# Patient Record
Sex: Female | Born: 1955
Health system: Southern US, Community
[De-identification: ages and names within clinical notes are randomized; demographics above are authoritative.]

## PROBLEM LIST (undated history)

## (undated) DIAGNOSIS — G2581 Restless legs syndrome: Secondary | ICD-10-CM

## (undated) DIAGNOSIS — F419 Anxiety disorder, unspecified: Secondary | ICD-10-CM

## (undated) DIAGNOSIS — E669 Obesity, unspecified: Secondary | ICD-10-CM

## (undated) DIAGNOSIS — R002 Palpitations: Secondary | ICD-10-CM

## (undated) DIAGNOSIS — I8390 Asymptomatic varicose veins of unspecified lower extremity: Secondary | ICD-10-CM

## (undated) DIAGNOSIS — G4700 Insomnia, unspecified: Secondary | ICD-10-CM

## (undated) DIAGNOSIS — K589 Irritable bowel syndrome without diarrhea: Secondary | ICD-10-CM

## (undated) DIAGNOSIS — R7303 Prediabetes: Secondary | ICD-10-CM

## (undated) DIAGNOSIS — G458 Other transient cerebral ischemic attacks and related syndromes: Secondary | ICD-10-CM

## (undated) DIAGNOSIS — M79606 Pain in leg, unspecified: Secondary | ICD-10-CM

## (undated) DIAGNOSIS — IMO0002 Reserved for concepts with insufficient information to code with codable children: Secondary | ICD-10-CM

## (undated) DIAGNOSIS — K219 Gastro-esophageal reflux disease without esophagitis: Secondary | ICD-10-CM

## (undated) DIAGNOSIS — C801 Malignant (primary) neoplasm, unspecified: Secondary | ICD-10-CM

## (undated) DIAGNOSIS — M545 Low back pain, unspecified: Secondary | ICD-10-CM

## (undated) DIAGNOSIS — R569 Unspecified convulsions: Secondary | ICD-10-CM

## (undated) DIAGNOSIS — F32A Depression, unspecified: Secondary | ICD-10-CM

## (undated) DIAGNOSIS — M1990 Unspecified osteoarthritis, unspecified site: Secondary | ICD-10-CM

## (undated) DIAGNOSIS — R519 Headache, unspecified: Secondary | ICD-10-CM

## (undated) DIAGNOSIS — R0600 Dyspnea, unspecified: Secondary | ICD-10-CM

## (undated) DIAGNOSIS — D649 Anemia, unspecified: Secondary | ICD-10-CM

## (undated) DIAGNOSIS — F329 Major depressive disorder, single episode, unspecified: Secondary | ICD-10-CM

## (undated) DIAGNOSIS — I809 Phlebitis and thrombophlebitis of unspecified site: Secondary | ICD-10-CM

## (undated) DIAGNOSIS — R51 Headache: Secondary | ICD-10-CM

## (undated) HISTORY — DX: Depression, unspecified: F32.A

## (undated) HISTORY — DX: Other transient cerebral ischemic attacks and related syndromes: G45.8

## (undated) HISTORY — DX: Major depressive disorder, single episode, unspecified: F32.9

## (undated) HISTORY — DX: Low back pain: M54.5

## (undated) HISTORY — DX: Headache: R51

## (undated) HISTORY — PX: APPENDECTOMY: SHX54

## (undated) HISTORY — DX: Irritable bowel syndrome, unspecified: K58.9

## (undated) HISTORY — DX: Gastro-esophageal reflux disease without esophagitis: K21.9

## (undated) HISTORY — DX: Low back pain, unspecified: M54.50

## (undated) HISTORY — PX: COLONOSCOPY: SHX174

## (undated) HISTORY — PX: OTHER SURGICAL HISTORY: SHX169

## (undated) HISTORY — DX: Headache, unspecified: R51.9

## (undated) HISTORY — DX: Pain in leg, unspecified: M79.606

## (undated) HISTORY — DX: Anemia, unspecified: D64.9

## (undated) HISTORY — PX: BREAST LUMPECTOMY: SHX2

## (undated) HISTORY — DX: Palpitations: R00.2

## (undated) HISTORY — DX: Phlebitis and thrombophlebitis of unspecified site: I80.9

## (undated) HISTORY — DX: Insomnia, unspecified: G47.00

## (undated) HISTORY — DX: Reserved for concepts with insufficient information to code with codable children: IMO0002

## (undated) HISTORY — DX: Obesity, unspecified: E66.9

## (undated) HISTORY — DX: Asymptomatic varicose veins of unspecified lower extremity: I83.90

---

## 2002-04-19 HISTORY — PX: LEG SURGERY: SHX1003

## 2011-04-20 DIAGNOSIS — R569 Unspecified convulsions: Secondary | ICD-10-CM

## 2011-04-20 HISTORY — DX: Unspecified convulsions: R56.9

## 2011-07-27 ENCOUNTER — Encounter: Payer: Self-pay | Admitting: Vascular Surgery

## 2011-08-12 ENCOUNTER — Encounter: Payer: Self-pay | Admitting: Vascular Surgery

## 2011-08-13 ENCOUNTER — Ambulatory Visit (INDEPENDENT_AMBULATORY_CARE_PROVIDER_SITE_OTHER): Payer: Medicaid Other | Admitting: *Deleted

## 2011-08-13 ENCOUNTER — Ambulatory Visit (INDEPENDENT_AMBULATORY_CARE_PROVIDER_SITE_OTHER): Payer: Medicaid Other | Admitting: Vascular Surgery

## 2011-08-13 ENCOUNTER — Encounter: Payer: Self-pay | Admitting: Vascular Surgery

## 2011-08-13 VITALS — BP 136/72 | HR 73 | Temp 97.5°F | Ht 63.0 in | Wt 165.0 lb

## 2011-08-13 DIAGNOSIS — I8390 Asymptomatic varicose veins of unspecified lower extremity: Secondary | ICD-10-CM | POA: Insufficient documentation

## 2011-08-13 DIAGNOSIS — I872 Venous insufficiency (chronic) (peripheral): Secondary | ICD-10-CM

## 2011-08-13 DIAGNOSIS — I83009 Varicose veins of unspecified lower extremity with ulcer of unspecified site: Secondary | ICD-10-CM

## 2011-08-13 DIAGNOSIS — L97909 Non-pressure chronic ulcer of unspecified part of unspecified lower leg with unspecified severity: Secondary | ICD-10-CM

## 2011-08-13 NOTE — Progress Notes (Signed)
[facility] [place] [facility]  Referred by:  Monico Blitz, MD [address]  [place],  [address]  Reason for referral: R leg varicosities  History of Present Illness  Tammy Sandoval is a 56 y.o. (10-Apr-1956) female who presents with chief complaint: swollen leg.  Patient notes, onset of swelling years ago, not associated with any specific etiology.  The patient has had no history of DVT, known history of varicose vein, no history of venous stasis ulcers, no history of  Lymphedema and no history of skin changes in lower legs.  There is known family history of venous disorders.  The patient has used knee high compression stockings in the past.  The patient had history of accidentally biopsy of thrombosed varicose vein leading to significant bleeding.  The patient is symptomatic with aching and itching as the day goes along.  Pt has undergone leg compression therapy for venous ulceration in the past.  Past Medical History  Diagnosis Date  . Ulcer     RLE  . Low back pain   . GERD (gastroesophageal reflux disease)   . Obesity   . Irritable bowel syndrome   . Thrombophlebitis   . Depression   . Anemia   . Generalized headaches   . Leg pain   . Insomnia   . Crescendo transient ischemic attacks   . Palpitations   . Varicose veins     Past Surgical History  Procedure Date  . Appendectomy   . Cesarean section   . Breast lumpectomy     bilateral  . Leg surgery 2004    mass removed on right leg    History   Social History  . Marital Status: Single    Spouse Name: N/A    Number of Children: N/A  . Years of Education: N/A   Occupational History  . Not on file.   Social History Main Topics  . Smoking status: Never Smoker   . Smokeless tobacco: Never Used  . Alcohol Use: No  . Drug Use: No  . Sexually Active:    Other Topics Concern  . Not on file   Social History Narrative  . No narrative on file    Family History  Problem Relation Age of Onset    . Cancer Mother     pancreatic and breast  . Deep vein thrombosis Mother   . Diabetes Mother   . Heart disease Mother   . Other Mother     varicose veing  . Heart disease Father   . Hyperlipidemia Father   . Hypertension Father   . Heart attack Father   . Diabetes Brother   . Heart attack Brother     Current Outpatient Prescriptions on File Prior to Visit  Medication Sig Dispense Refill  . ALPRAZolam (XANAX) 1 MG tablet Take 1 mg by mouth 2 (two) times daily.      . Aspirin-Salicylamide-Caffeine (BC HEADACHE POWDER PO) Take by mouth as needed.      Marland Kitchen esomeprazole (NEXIUM) 40 MG capsule Take 40 mg by mouth daily.      Marland Kitchen gabapentin (NEURONTIN) 300 MG capsule Take 300 mg by mouth 2 (two) times daily. Take 2 capsules twice daily as directed.      . lubiprostone (AMITIZA) 24 MCG capsule Take 24 mcg by mouth 2 (two) times daily with a meal.       . PARoxetine (PAXIL-CR) 37.5 MG 24 hr tablet Take 37.5 mg by mouth daily. Take 2  tablets daily.        Allergies  Allergen Reactions  . Latex     Redness,swelling  . Wellbutrin (Bupropion Hcl)     Review of Systems (Positive items checked otherwise negative)  General: [ ]  Weight loss, [ ]  Weight gain, [ ]   Loss of appetite, [ ]  Fever  Neurologic: [x]  Dizziness, [ ]  Blackouts, [ ]  Headaches, [ ]  Seizure, [x]  weakness and numbness in legs,  Ear/Nose/Throat: [ ]  Change in eyesight, [ ]  Change in hearing, [ ]  Nose bleeds, [ ]  Sore throat  Vascular: [x]  Pain in legs with walking, [x]  Pain in feet while lying flat, [ ]  Non-healing ulcer, [ ]  Stroke, [ ]  "Mini stroke", [x]  Slurred speech, [ ]  Temporary blindness, [ ]  Blood clot in vein, [ ]  Phlebitis, [x]  vaicose veins, [x]  leg swelling  Pulmonary: [ ]  Home oxygen, [ ]  Productive cough, [ ]  Bronchitis, [ ]  Coughing up blood, [ ]  Asthma, [ ]  Wheezing  Musculoskeletal: [ ]  Arthritis, [ ]  Joint pain, [ ]  Muscle pain, [x]  chronic back pain with bilateral leg numbness requiring back  injections  Cardiac: [ ]  Chest pain, [ ]  Chest tightness/pressure, [ ]  Shortness of breath when lying flat, [ ]  Shortness of breath with exertion, [ ]  Palpitations, [ ]  Heart murmur, [ ]  Arrythmia, [ ]  Atrial fibrillation  Hematologic: [ ]  Bleeding problems, [ ]  Clotting disorder, [ ]  Anemia  Psychiatric:  [x]  Depression, [ ]  Anxiety, [ ]  Attention deficit disorder  Gastrointestinal:  [ ]  Black stool,[ ]   Blood in stool, [ ]  Peptic ulcer disease, [ ]  Reflux, [ ]  Hiatal hernia, [ ]  Trouble swallowing, [ ]  Diarrhea, [ ]  Constipation  Urinary:  [ ]  Kidney disease, [ ]  Burning with urination, [ ]  Frequent urination, [ ]  Difficulty urinating  Skin: [ ]  Ulcers, [ ]  Rashes  Physical Examination  Filed Vitals:   08/13/11 0957  BP: 136/72  Pulse: 73  Temp: 97.5 F (36.4 C)  TempSrc: Oral  Height: 5' 3"  (1.6 m)  Weight: 165 lb (74.844 kg)   Body mass index is 29.23 kg/(m^2).  General: A&O x 3, WDWN  Head: [place]/AT  Ear/Nose/Throat: Hearing grossly intact, nares w/o erythema or drainage, oropharynx w/o Erythema/Exudate  Eyes: PERRLA, EOMI  Neck: Supple, no nuchal rigidity, no palpable LAD  Pulmonary: Sym exp, good air movt, CTAB, no rales, rhonchi, & wheezing  Cardiac: RRR, Nl S1, S2, no Murmurs, rubs or gallops  Vascular: Vessel Right Left  Radial Palpable Palpable  Brachial Palpable Palpable  Carotid Palpable, without bruit Palpable, without bruit  Aorta Non-palpable N/A  Femoral Palpable Palpable  Popliteal Non-palpable Non-palpable  PT Palpable Palpable  DP Palpable Palpable   Gastrointestinal: soft, NTND, -G/R, - HSM, - masses, - CVAT B  Musculoskeletal: M/S 5/5 throughout , Extremities without ischemic changes , R anterior and posterior lower leg varicosities, ?healed ulcerations vs biopsy sites, L posterior lower leg varicosities  Neurologic: CN 2-12 intact , Pain and light touch intact in extremities ,Motor exam as listed above  Psychiatric: Judgment intact, Mood  & affect appropriate for pt's clinical situation  Dermatologic: See M/S exam for extremity exam, no rashes otherwise noted  Lymph : No Cervical, Axillary, or Inguinal lymphadenopathy   Non-Invasive Vascular Imaging  RLE Venous Insufficiency Duplex (Date: 08/13/11):   RLE: GSV reflux, deep venous reflux throughout, no DVT  Outside Studies/Documentation 10 pages of outside documents were reviewed including: documentation of prior treatment for presumed venous ulcers  and OSH arterial studies.  Medical Decision Making  Tammy Sandoval is a 56 y.o. female who presents with: CVI (C5)   Based on the patient's history and examination, I recommend: 3 month thigh high 20-30 mm Hg compression stockings and then evaluation in [facility] for possible EVLA of R GSV  I suspect the left leg may eventually need evaluation but not immediately.  The pt was given the Rx for the compression stockings and will be scheduled for an appt in the [facility] in 3 months.  Thank you for allowing [place] to participate in this patient's care.  Adele Barthel, MD [facility] Office: [phone] Pager: [phone]  08/13/2011, 11:09 AM

## 2011-08-23 NOTE — Procedures (Unsigned)
DUPLEX DEEP VENOUS EXAM - LOWER EXTREMITY  INDICATION:  Venous stasis ulcer  HISTORY:  Edema:  Yes Trauma/Surgery:  No Pain:  Yes PE:  No Previous DVT:  No Anticoagulants: Other:  DUPLEX EXAM:               CFV   SFV   PopV  PTV    GSV               R  L  R  L  R  L  R   L  R  L Thrombosis    o     o     o     o      o Spontaneous   +     +     +     +      + Phasic        +     +     +     +      + Augmentation  +     +     +     +      + Compressible  +     +     +     +      + Competent     o     o     o     o      o  Legend:  + - yes  o - no  p - partial  D - decreased  IMPRESSION: 1. No evidence of deep vein thrombosis noted throughout the right     lower extremity. 2. Reflux of >500 milliseconds is noted throughout the right great     saphenous vein with diameter measurements noted on the attached     worksheet. 3. Reflux of >500 milliseconds is noted throughout the right lower     extremity deep venous system.    _____________________________ Conrad Van Buren, MD  CH/MEDQ  D:  08/16/2011  T:  08/16/2011  Job:  [id number]

## 2011-11-12 ENCOUNTER — Encounter: Payer: Self-pay | Admitting: Vascular Surgery

## 2011-11-15 ENCOUNTER — Encounter: Payer: Self-pay | Admitting: Vascular Surgery

## 2011-11-15 ENCOUNTER — Ambulatory Visit (INDEPENDENT_AMBULATORY_CARE_PROVIDER_SITE_OTHER): Payer: Medicaid Other | Admitting: Vascular Surgery

## 2011-11-15 VITALS — BP 125/83 | HR 81 | Resp 18 | Ht 63.0 in | Wt 165.0 lb

## 2011-11-15 DIAGNOSIS — I83893 Varicose veins of bilateral lower extremities with other complications: Secondary | ICD-10-CM

## 2011-11-15 NOTE — Progress Notes (Signed)
Subjective:     Patient ID: Tammy Sandoval, female   DOB: [birth year], 56 y.o.   MRN: [id number]  HPI this 56 year old female returns for further followup regarding her bilateral severe venous insufficiency. She has bulging varicosities in both legs right worse than left. Right leg has a history of bleeding from varicosity site or a skin biopsy was performed several years ago. She also has developed a stasis ulcer in the right ankle since she was seen here 3 months ago. She has been wearing long leg elastic compression stockings 20-30 mm gradient and trying elevation and ibuprofen. Both legs cause aching throbbing and burning discomfort as the day progresses despite stocking use.  Past Medical History  Diagnosis Date  . Ulcer     RLE  . Low back pain   . GERD (gastroesophageal reflux disease)   . Obesity   . Irritable bowel syndrome   . Thrombophlebitis   . Depression   . Anemia   . Generalized headaches   . Leg pain   . Insomnia   . Crescendo transient ischemic attacks   . Palpitations   . Varicose veins     History  Substance Use Topics  . Smoking status: Never Smoker   . Smokeless tobacco: Never Used  . Alcohol Use: No    Family History  Problem Relation Age of Onset  . Cancer Mother     pancreatic and breast  . Deep vein thrombosis Mother   . Diabetes Mother   . Heart disease Mother   . Other Mother     varicose veing  . Heart disease Father   . Hyperlipidemia Father   . Hypertension Father   . Heart attack Father   . Diabetes Brother   . Heart attack Brother     Allergies  Allergen Reactions  . Latex     Redness,swelling  . Wellbutrin (Bupropion Hcl)     Current outpatient prescriptions:ALPRAZolam (XANAX) 1 MG tablet, Take 1 mg by mouth 2 (two) times daily., Disp: , Rfl: ;  Aspirin-Salicylamide-Caffeine (BC HEADACHE POWDER PO), Take by mouth as needed., Disp: , Rfl: ;  esomeprazole (NEXIUM) 40 MG capsule, Take 40 mg by mouth daily., Disp: , Rfl: ;   gabapentin (NEURONTIN) 300 MG capsule, Take 300 mg by mouth 2 (two) times daily. Take 2 capsules twice daily as directed., Disp: , Rfl:  lubiprostone (AMITIZA) 24 MCG capsule, Take 24 mcg by mouth 2 (two) times daily with a meal. , Disp: , Rfl: ;  PARoxetine (PAXIL-CR) 37.5 MG 24 hr tablet, Take 37.5 mg by mouth daily. Take 2 tablets daily., Disp: , Rfl:   BP 125/83  Pulse 81  Resp 18  Ht 5' 3"  (1.6 m)  Wt 165 lb (74.844 kg)  BMI 29.23 kg/m2  Body mass index is 29.23 kg/(m^2).           Review of Systems denies chest pain, dyspnea on exertion, PND, orthopnea, claudication. Does have occasional panic attacks    Objective:   Physical Exam blood pressure 120/83 heart rate 81 respirations 18 General well-developed well-nourished middle-aged female no apparent stress alert and oriented x3 Lungs no rhonchi or wheezing Right leg with bulging varicosities in the distal thigh and anterior medial calf with ulceration posterior to right medial malleolus measuring a centimeter in diameter. 3 posterior cells pedis pulse palpable and 1+ chronic edema. Left leg with bulging varicosities in the posterior thigh popliteal fossa and into the proximal calf with 1+ edema.  Assessment:     Severe bilateral venous insufficiency right worse than left with history of bleeding and stasis ulcer right leg. Symptoms not responding to long-leg elastic compression stockings, elevation, and ibuprofen    Plan:     Patient needs #1 laser ablation right great saphenous vein with greater than 20 stab phlebectomy. Upon return for followup duplex exam she will need reflux venous duplex study of left leg to see what treatment will be indicated

## 2011-11-26 ENCOUNTER — Encounter: Payer: Self-pay | Admitting: Vascular Surgery

## 2011-11-29 ENCOUNTER — Ambulatory Visit (INDEPENDENT_AMBULATORY_CARE_PROVIDER_SITE_OTHER): Payer: Medicaid Other | Admitting: Vascular Surgery

## 2011-11-29 ENCOUNTER — Encounter: Payer: Self-pay | Admitting: Vascular Surgery

## 2011-11-29 VITALS — BP 124/76 | HR 90 | Resp 16 | Ht 63.0 in | Wt 160.0 lb

## 2011-11-29 DIAGNOSIS — I83893 Varicose veins of bilateral lower extremities with other complications: Secondary | ICD-10-CM

## 2011-11-29 NOTE — Progress Notes (Signed)
Laser Ablation Procedure      Date: 11/29/2011    Tammy Sandoval DOB:[birth year]  Consent signed: Yes  Surgeon:J.D. Kellie Simmering  Procedure: Laser Ablation: right Greater Saphenous Vein  BP 124/76  Pulse 90  Resp 16  Ht 5' 3"  (1.6 m)  Wt 160 lb (72.576 kg)  BMI 28.34 kg/m2  Start time: 1:00   End time: 2:00  Tumescent Anesthesia: 500 cc 0.9% NaCl with 50 cc Lidocaine HCL with 1% Epi and 15 cc 8.4% NaHCO3  Local Anesthesia: 16 cc Lidocaine HCL and NaHCO3 (ratio 2:1)  Pulsed mode: Watts 15 Seconds 1 Pulses:1 Total Pulses:132 Total Energy: 1980 Total Time: 2:12     Stab Phlebectomy:greater than 20  Sites: Calf  Patient tolerated procedure well: Yes  Notes:   Description of Procedure:  After marking the course of the saphenous vein and the secondary varicosities in the standing position, the patient was placed on the operating table in the supine position, and the right leg was prepped and draped in sterile fashion. Local anesthetic was administered, and under ultrasound guidance the saphenous vein was accessed with a micro needle and guide wire; then the micro puncture sheath was placed. A guide wire was inserted to the saphenofemoral junction, followed by a 5 french sheath.  The position of the sheath and then the laser fiber below the junction was confirmed using the ultrasound and visualization of the aiming beam.  Tumescent anesthesia was administered along the course of the saphenous vein using ultrasound guidance. Protective laser glasses were placed on the patient, and the laser was fired at 15 watt pulsed mode advancing 1-2 mm per sec.  For a total of 1980 joules.  A steri strip was applied to the puncture site.  The patient was then put into Trendelenburg position.  Local anesthetic was utilized overlying the marked varicosities.  Greater than 20 stab wounds were made using the tip of an 11 blade; and using the vein hook,  The phlebectomies were performed using a hemostat  to avulse these varicosities.  Adequate hemostasis was achieved, and steri strips were applied to the stab wound.      ABD pads and thigh high compression stockings were applied.  Ace wrap bandages were applied over the phlebectomy sites and at the top of the saphenofemoral junction.  Blood loss was less than 15 cc.  The patient ambulated out of the operating room having tolerated the procedure well.

## 2011-11-29 NOTE — Progress Notes (Signed)
Subjective:     Patient ID: Chilton Greathouse, female   DOB: 09/16/1955, 56 y.o.   MRN: [id number]  HPI this 56 year old female had laser ablation of the right great saphenous vein from the proximal calf to the saphenofemoral junction plus greater than 20 stab phlebectomy performed under local tumescent anesthesia. A total of 1980 J of energy was utilized. She tolerated the procedure well.   Review of Systems     Objective:   Physical ExamBP 124/76  Pulse 90  Resp 16  Ht 5' 3"  (1.6 m)  Wt 160 lb (72.576 kg)  BMI 28.34 kg/m2      Assessment:    well-tolerated laser ablation right great saphenous vein with greater than 20 stab phlebectomy    Plan:     Return August 20 for venous duplex exam right leg to confirm closure right great saphenous vein and also venous duplex exam left leg to look for reflux and great saphenous system for similar symptoms

## 2011-11-30 ENCOUNTER — Encounter: Payer: Self-pay | Admitting: Vascular Surgery

## 2011-11-30 ENCOUNTER — Telehealth: Payer: Self-pay | Admitting: *Deleted

## 2011-11-30 NOTE — Telephone Encounter (Signed)
Patient doing well. No bleeding from stab sites. Slept pretty well. Following all instructions. Reminded her of her appt. Next week.

## 2011-12-06 ENCOUNTER — Encounter: Payer: Self-pay | Admitting: Vascular Surgery

## 2011-12-06 ENCOUNTER — Other Ambulatory Visit: Payer: Self-pay | Admitting: *Deleted

## 2011-12-06 DIAGNOSIS — I83893 Varicose veins of bilateral lower extremities with other complications: Secondary | ICD-10-CM

## 2011-12-07 ENCOUNTER — Encounter: Payer: Self-pay | Admitting: Vascular Surgery

## 2011-12-07 ENCOUNTER — Other Ambulatory Visit: Payer: Self-pay | Admitting: *Deleted

## 2011-12-07 ENCOUNTER — Encounter (INDEPENDENT_AMBULATORY_CARE_PROVIDER_SITE_OTHER): Payer: Medicaid Other | Admitting: *Deleted

## 2011-12-07 ENCOUNTER — Ambulatory Visit (INDEPENDENT_AMBULATORY_CARE_PROVIDER_SITE_OTHER): Payer: Medicaid Other | Admitting: Vascular Surgery

## 2011-12-07 VITALS — BP 124/79 | HR 81 | Resp 16 | Ht 63.0 in | Wt 160.0 lb

## 2011-12-07 DIAGNOSIS — I83893 Varicose veins of bilateral lower extremities with other complications: Secondary | ICD-10-CM

## 2011-12-07 NOTE — Progress Notes (Signed)
Subjective:     Patient ID: Tammy Sandoval, female   DOB: 31-Jan-1956, 56 y.o.   MRN: [id number]  HPI this 7 are old female returns for initial followup regarding her right leg venous procedure performed one week ago. She had laser ablation of the right great saphenous vein with greater than 20 stab phlebectomy for painful varicosities. She is very pleased with her early result. She states that the right leg discomfort is aren't improved. She has a history of stasis ulcers in the ankle and has one ulcer which has not healed yet measuring less than 1 cm in diameter. She has been wearing long leg elastic compression stockings on her left leg but continues to have aching throbbing and burning discomfort particularly in the distal thigh extending posteriorly into the popliteal space. She has ankle edema as the day progresses on the left. She has no history of DVT or thrombophlebitis in the left leg. She does not have a history of stasis ulcers on the left leg.  Past Medical History  Diagnosis Date  . Ulcer     RLE  . Low back pain   . GERD (gastroesophageal reflux disease)   . Obesity   . Irritable bowel syndrome   . Thrombophlebitis   . Depression   . Anemia   . Generalized headaches   . Leg pain   . Insomnia   . Crescendo transient ischemic attacks   . Palpitations   . Varicose veins     History  Substance Use Topics  . Smoking status: Never Smoker   . Smokeless tobacco: Never Used  . Alcohol Use: No    Family History  Problem Relation Age of Onset  . Cancer Mother     pancreatic and breast  . Deep vein thrombosis Mother   . Diabetes Mother   . Heart disease Mother   . Other Mother     varicose veing  . Heart disease Father   . Hyperlipidemia Father   . Hypertension Father   . Heart attack Father   . Diabetes Brother   . Heart attack Brother     Allergies  Allergen Reactions  . Latex     Redness,swelling  . Wellbutrin (Bupropion Hcl)     Current  outpatient prescriptions:ALPRAZolam (XANAX) 1 MG tablet, Take 1 mg by mouth 2 (two) times daily., Disp: , Rfl: ;  Aspirin-Salicylamide-Caffeine (BC HEADACHE POWDER PO), Take by mouth as needed., Disp: , Rfl: ;  esomeprazole (NEXIUM) 40 MG capsule, Take 40 mg by mouth daily., Disp: , Rfl: ;  gabapentin (NEURONTIN) 300 MG capsule, Take 300 mg by mouth 2 (two) times daily. Take 2 capsules twice daily as directed., Disp: , Rfl:  lubiprostone (AMITIZA) 24 MCG capsule, Take 24 mcg by mouth 2 (two) times daily with a meal. , Disp: , Rfl: ;  PARoxetine (PAXIL-CR) 37.5 MG 24 hr tablet, Take 37.5 mg by mouth daily. Take 2 tablets daily., Disp: , Rfl:   BP 124/79  Pulse 81  Resp 16  Ht 5' 3"  (1.6 m)  Wt 160 lb (72.576 kg)  BMI 28.34 kg/m2  Body mass index is 28.34 kg/(m^2).           Review of Systems     Objective:   Physical Exam blood pressure 124/79 heart rate 81 respirations 16 General well-developed well-nourished female no apparent stress alert and oriented x3 Right lower extremity has well-healed stab phlebectomy sites with no distal edema. There is mild tenderness along  the course of the great saphenous vein down to the distal thigh area. Left leg has bulging varicosities in the distal thigh extending posteriorly to the popliteal fossa with 1+ edema distally.  Today I ordered a venous duplex exam. Right leg shows total closure of the great saphenous vein from the distal thigh the saphenofemoral junction with no DVT. Left leg reveals gross reflux in the great saphenous vein from the distal thigh to the saphenofemoral junction.    Assessment:     Successful laser ablation right great saphenous vein with multiple stab phlebectomy for painful varicosities-good early result #2 patient has venous insufficiency left leg due to gross reflux left great saphenous vein with painful varicosities    Plan:     Patient needs a laser ablation left great saphenous vein with greater than 20 stab  phlebectomy Will proceed to near future to perform this on this nice patient

## 2011-12-08 ENCOUNTER — Telehealth: Payer: Self-pay | Admitting: Vascular Surgery

## 2011-12-08 NOTE — Telephone Encounter (Signed)
Tammy Sandoval 10/08/1955 [id number] needs a one week fu lab and JDL visit on 01/03/12. Thx  Scheduled, dpm

## 2011-12-13 ENCOUNTER — Other Ambulatory Visit: Payer: Self-pay | Admitting: *Deleted

## 2011-12-13 DIAGNOSIS — I83893 Varicose veins of bilateral lower extremities with other complications: Secondary | ICD-10-CM

## 2011-12-24 ENCOUNTER — Encounter: Payer: Self-pay | Admitting: Vascular Surgery

## 2011-12-27 ENCOUNTER — Ambulatory Visit (INDEPENDENT_AMBULATORY_CARE_PROVIDER_SITE_OTHER): Payer: Medicaid Other | Admitting: Vascular Surgery

## 2011-12-27 ENCOUNTER — Encounter: Payer: Self-pay | Admitting: Vascular Surgery

## 2011-12-27 VITALS — BP 126/81 | HR 89 | Resp 16 | Ht 63.0 in | Wt 160.0 lb

## 2011-12-27 DIAGNOSIS — I83893 Varicose veins of bilateral lower extremities with other complications: Secondary | ICD-10-CM

## 2011-12-27 NOTE — Progress Notes (Signed)
Subjective:     Patient ID: Tammy Sandoval, female   DOB: 09-12-1955, 56 y.o.   MRN: [id number]  HPI this 55 year old female had laser ablation of left great saphenous vein and greater than 20 stab phlebectomy performed under local tumescent anesthesia for painful varicosities do to reflux in the left great saphenous system. A total of 1529 J of energy was utilized. She tolerated the procedure well.  Review of Systems     Objective:   Physical ExamBP 126/81  Pulse 89  Resp 16  Ht 5' 3"  (1.6 m)  Wt 160 lb (72.576 kg)  BMI 28.34 kg/m2      Assessment:    well-tolerated laser ablation left great saphenous vein with greater than 20 stab phlebectomy performed under local tumescent anesthesia    Plan:     Return in one week for venous duplex exam left leg to confirm closure left great saphenous vein

## 2011-12-27 NOTE — Progress Notes (Signed)
Laser Ablation Procedure      Date: 12/27/2011    Tammy Sandoval Roselle Locus DOB:1955-05-23  Consent signed: Yes  Surgeon:J.D. Kellie Simmering  Procedure: Laser Ablation: left Greater Saphenous Vein  BP 126/81  Pulse 89  Resp 16  Ht 5' 3"  (1.6 m)  Wt 160 lb (72.576 kg)  BMI 28.34 kg/m2  Start time: 11:15   End time: 12:15  Tumescent Anesthesia: 400 cc 0.9% NaCl with 50 cc Lidocaine HCL with 1% Epi and 15 cc 8.4% NaHCO3  Local Anesthesia: 17 cc Lidocaine HCL and NaHCO3 (ratio 2:1)  Pulsed mode: Watts 15 Seconds 1 Pulses:1 Total Pulses:102 Total Energy: 1529 Total Time: 1:41     Stab Phlebectomy: >20 Sites: Calf  Patient tolerated procedure well: Yes  Notes:   Description of Procedure:  After marking the course of the saphenous vein and the secondary varicosities in the standing position, the patient was placed on the operating table in the supine position, and the left leg was prepped and draped in sterile fashion. Local anesthetic was administered, and under ultrasound guidance the saphenous vein was accessed with a micro needle and guide wire; then the micro puncture sheath was placed. A guide wire was inserted to the saphenofemoral junction, followed by a 5 french sheath.  The position of the sheath and then the laser fiber below the junction was confirmed using the ultrasound and visualization of the aiming beam.  Tumescent anesthesia was administered along the course of the saphenous vein using ultrasound guidance. Protective laser glasses were placed on the patient, and the laser was fired at 15 watt pulsed mode advancing 1-2 mm per sec.  For a total of 1529 joules.  A steri strip was applied to the puncture site.  The patient was then put into Trendelenburg position.  Local anesthetic was utilized overlying the marked varicosities.  Greater than 20 stab wounds were made using the tip of an 11 blade; and using the vein hook,  The phlebectomies were performed using a hemostat to avulse  these varicosities.  Adequate hemostasis was achieved, and steri strips were applied to the stab wound.      ABD pads and thigh high compression stockings were applied.  Ace wrap bandages were applied over the phlebectomy sites and at the top of the saphenofemoral junction.  Blood loss was less than 15 cc.  The patient ambulated out of the operating room having tolerated the procedure well.

## 2011-12-28 ENCOUNTER — Telehealth: Payer: Self-pay | Admitting: *Deleted

## 2011-12-28 NOTE — Telephone Encounter (Signed)
Left a message for patient to call me if she is having any problems. Reminded her of her fu appts next week.

## 2011-12-29 ENCOUNTER — Telehealth: Payer: Self-pay | Admitting: *Deleted

## 2011-12-29 ENCOUNTER — Other Ambulatory Visit: Payer: Self-pay | Admitting: *Deleted

## 2011-12-29 ENCOUNTER — Ambulatory Visit (INDEPENDENT_AMBULATORY_CARE_PROVIDER_SITE_OTHER): Payer: Medicaid Other | Admitting: *Deleted

## 2011-12-29 DIAGNOSIS — I83893 Varicose veins of bilateral lower extremities with other complications: Secondary | ICD-10-CM

## 2011-12-29 DIAGNOSIS — M79609 Pain in unspecified limb: Secondary | ICD-10-CM

## 2011-12-29 DIAGNOSIS — M7989 Other specified soft tissue disorders: Secondary | ICD-10-CM

## 2011-12-29 NOTE — Telephone Encounter (Signed)
Ms. Goold is s/p 12-27-2011 endovenous laser ablation left saphenous vein and stab phlebectomy >20 incisions by Tinnie Gens MD.  Ms. Cossey states that upon removing her compression this morning, her left foot and ankle are extremely swollen and she "cannot see my left ankle."  She also states that she is experiencing redness and soreness behind her left knee. Discussed Ms. Goodridge's symptoms with Dr. Scot Dock who recommended deep venous reflux study. Scheduled post laser ablation venous duplex  for 2:00PM today (12-29-2011).  Called Ms. Grandville Silos and notified her of appointment.   Mrs. Garza verbalized understanding.

## 2011-12-29 NOTE — Progress Notes (Signed)
Patient had a laser ablation with stab phlebs L GSV on Monday. Called in complaining of swelling of foot and ankle. Spoke with Dr. Scot Dock who agreed she should come in for reflux study. Study showed no evidence of DVT and her saphenous vein has successfully been closed. I told the patient these findings and will let Dr. Kellie Simmering know. Cancelled her fu appts for next week. We have done both procedures we had planned to do. Gave her instructions for how much longer to wear the stocking and to take the Ibuprofen 683m TID until next Tuesday. Suggested heat now and elevation. Will follow prn.

## 2012-01-03 ENCOUNTER — Ambulatory Visit: Payer: Medicaid Other | Admitting: Vascular Surgery

## 2012-01-04 ENCOUNTER — Ambulatory Visit: Payer: Medicaid Other | Admitting: Vascular Surgery

## 2015-08-04 ENCOUNTER — Other Ambulatory Visit: Payer: Self-pay | Admitting: Neurosurgery

## 2015-08-21 NOTE — Progress Notes (Signed)
I was unable to reach patient by phone.  I left  A message on voice mail.  I instructed the patient to arrive at [facility] at --7;30 AM   , nothing to eat or drink after midnight.   I instructed the patient to take the following medications in the am with just enough water to get them down:  Xanax, Gabapentin, Paxil and Nexium- DO NOT  Take any BC Powders tonight or am.I asked patient to not wear any lotions, powders, cologne, jewelry, piercing, make-up or nail polish, do not shave.  I asked the patient to call [phone], in the am if there were any questions or problems.

## 2015-08-22 ENCOUNTER — Ambulatory Visit (HOSPITAL_COMMUNITY): Payer: Medicare Other | Admitting: Certified Registered Nurse Anesthetist

## 2015-08-22 ENCOUNTER — Encounter (HOSPITAL_COMMUNITY)
Admission: RE | Disposition: A | Discharge status: Home / Self Care | Payer: Self-pay | Source: Ambulatory Visit | Attending: Neurosurgery

## 2015-08-22 ENCOUNTER — Encounter (HOSPITAL_COMMUNITY): Payer: Self-pay | Admitting: *Deleted

## 2015-08-22 ENCOUNTER — Ambulatory Visit (HOSPITAL_COMMUNITY)
Admission: RE | Admit: 2015-08-22 | Discharge: 2015-08-23 | Disposition: A | Discharge status: Home / Self Care | Payer: Medicare Other | Source: Ambulatory Visit | Attending: Neurosurgery | Admitting: Neurosurgery

## 2015-08-22 ENCOUNTER — Ambulatory Visit (HOSPITAL_COMMUNITY): Payer: Medicare Other

## 2015-08-22 DIAGNOSIS — M4722 Other spondylosis with radiculopathy, cervical region: Secondary | ICD-10-CM | POA: Insufficient documentation

## 2015-08-22 DIAGNOSIS — Z419 Encounter for procedure for purposes other than remedying health state, unspecified: Secondary | ICD-10-CM

## 2015-08-22 DIAGNOSIS — M50123 Cervical disc disorder at C6-C7 level with radiculopathy: Secondary | ICD-10-CM | POA: Insufficient documentation

## 2015-08-22 HISTORY — DX: Malignant (primary) neoplasm, unspecified: C80.1

## 2015-08-22 HISTORY — DX: Unspecified convulsions: R56.9

## 2015-08-22 HISTORY — DX: Restless legs syndrome: G25.81

## 2015-08-22 HISTORY — PX: CERVICAL DISC ARTHROPLASTY: SHX587

## 2015-08-22 LAB — CBC
HCT: 41.8 % (ref 36.0–46.0)
Hemoglobin: 13.7 g/dL (ref 12.0–15.0)
MCH: 30.6 pg (ref 26.0–34.0)
MCHC: 32.8 g/dL (ref 30.0–36.0)
MCV: 93.3 fL (ref 78.0–100.0)
Platelets: 339 10*3/uL (ref 150–400)
RBC: 4.48 MIL/uL (ref 3.87–5.11)
RDW: 13.8 % (ref 11.5–15.5)
WBC: 6.8 10*3/uL (ref 4.0–10.5)

## 2015-08-22 LAB — SURGICAL PCR SCREEN
MRSA, PCR: NEGATIVE
Staphylococcus aureus: POSITIVE — AB

## 2015-08-22 LAB — BASIC METABOLIC PANEL
Anion gap: 12 (ref 5–15)
BUN: 13 mg/dL (ref 6–20)
CO2: 23 mmol/L (ref 22–32)
Calcium: 9.2 mg/dL (ref 8.9–10.3)
Chloride: 107 mmol/L (ref 101–111)
Creatinine, Ser: 0.77 mg/dL (ref 0.44–1.00)
GFR calc Af Amer: >60 mL/min (ref >60)
GFR calc non Af Amer: >60 mL/min (ref >60)
Glucose, Bld: 97 mg/dL (ref 65–99)
Potassium: 4.1 mmol/L (ref 3.5–5.1)
Sodium: 142 mmol/L (ref 135–145)

## 2015-08-22 SURGERY — CERVICAL ANTERIOR DISC ARTHROPLASTY
Anesthesia: General

## 2015-08-22 MED ORDER — THROMBIN 5000 UNITS EX SOLR
CUTANEOUS | Status: DC | PRN
  Administered 2015-08-22 (×2): 5000 [IU] via TOPICAL

## 2015-08-22 MED ORDER — SUGAMMADEX SODIUM 200 MG/2ML IV SOLN
INTRAVENOUS | Status: DC | PRN
  Administered 2015-08-22: 120 mg via INTRAVENOUS

## 2015-08-22 MED ORDER — MIDAZOLAM HCL 2 MG/2ML IJ SOLN
INTRAMUSCULAR | Status: AC
  Filled 2015-08-22: qty 2

## 2015-08-22 MED ORDER — FENTANYL CITRATE (PF) 250 MCG/5ML IJ SOLN
INTRAMUSCULAR | Status: AC
  Filled 2015-08-22: qty 5

## 2015-08-22 MED ORDER — LACTATED RINGERS IV SOLN
INTRAVENOUS | Status: DC | PRN
  Administered 2015-08-22 (×2): via INTRAVENOUS

## 2015-08-22 MED ORDER — ACETAMINOPHEN 650 MG RE SUPP
650.0000 mg | RECTAL | Status: DC | PRN
Start: 2015-08-22 — End: 2015-08-23

## 2015-08-22 MED ORDER — ONDANSETRON HCL 4 MG/2ML IJ SOLN: 4.0000 mg | INTRAMUSCULAR | Status: DC | PRN

## 2015-08-22 MED ORDER — ARTIFICIAL TEARS OP OINT
TOPICAL_OINTMENT | OPHTHALMIC | Status: DC | PRN
  Administered 2015-08-22: 1 via OPHTHALMIC

## 2015-08-22 MED ORDER — GABAPENTIN 400 MG PO CAPS
800.0000 mg | ORAL_CAPSULE | Freq: Three times a day (TID) | ORAL | Status: DC
  Administered 2015-08-22 – 2015-08-23 (×2): 800 mg via ORAL
  Filled 2015-08-22 (×2): qty 2

## 2015-08-22 MED ORDER — DEXAMETHASONE SODIUM PHOSPHATE 10 MG/ML IJ SOLN
INTRAMUSCULAR | Status: DC | PRN
  Administered 2015-08-22: 10 mg via INTRAVENOUS

## 2015-08-22 MED ORDER — LACTATED RINGERS IV SOLN
INTRAVENOUS | Status: DC
  Administered 2015-08-22 (×3): via INTRAVENOUS

## 2015-08-22 MED ORDER — ZOLPIDEM TARTRATE 5 MG PO TABS: 5.0000 mg | ORAL_TABLET | Freq: Every evening | ORAL | Status: DC | PRN

## 2015-08-22 MED ORDER — PROPOFOL 10 MG/ML IV BOLUS
INTRAVENOUS | Status: DC | PRN
  Administered 2015-08-22: 170 mg via INTRAVENOUS

## 2015-08-22 MED ORDER — PHENOL 1.4 % MT LIQD: 1.0000 | OROMUCOSAL | Status: DC | PRN

## 2015-08-22 MED ORDER — SODIUM CHLORIDE 0.9% FLUSH: 3.0000 mL | INTRAVENOUS | Status: DC | PRN

## 2015-08-22 MED ORDER — ROCURONIUM BROMIDE 50 MG/5ML IV SOLN
INTRAVENOUS | Status: AC
  Filled 2015-08-22: qty 1

## 2015-08-22 MED ORDER — ESMOLOL HCL 100 MG/10ML IV SOLN
INTRAVENOUS | Status: DC | PRN
  Administered 2015-08-22: 50 ug via INTRAVENOUS

## 2015-08-22 MED ORDER — LUBIPROSTONE 24 MCG PO CAPS
24.0000 ug | ORAL_CAPSULE | Freq: Two times a day (BID) | ORAL | Status: DC
  Administered 2015-08-23: 24 ug via ORAL
  Filled 2015-08-22: qty 1

## 2015-08-22 MED ORDER — LINACLOTIDE 145 MCG PO CAPS
145.0000 ug | ORAL_CAPSULE | Freq: Every day | ORAL | Status: DC
Start: 2015-08-23 — End: 2015-08-23
  Administered 2015-08-23: 145 ug via ORAL
  Filled 2015-08-22: qty 1

## 2015-08-22 MED ORDER — EPHEDRINE 5 MG/ML INJ
INTRAVENOUS | Status: AC
  Filled 2015-08-22: qty 10

## 2015-08-22 MED ORDER — ARTIFICIAL TEARS OP OINT
TOPICAL_OINTMENT | OPHTHALMIC | Status: AC
  Filled 2015-08-22: qty 3.5

## 2015-08-22 MED ORDER — SUGAMMADEX SODIUM 200 MG/2ML IV SOLN
INTRAVENOUS | Status: AC
  Filled 2015-08-22: qty 2

## 2015-08-22 MED ORDER — CEFAZOLIN SODIUM-DEXTROSE 2-4 GM/100ML-% IV SOLN
2.0000 g | INTRAVENOUS | Status: AC
  Administered 2015-08-22: 2 g via INTRAVENOUS
  Filled 2015-08-22: qty 100

## 2015-08-22 MED ORDER — SODIUM CHLORIDE 0.9 % IV SOLN: 250.0000 mL | INTRAVENOUS | Status: DC

## 2015-08-22 MED ORDER — ONDANSETRON HCL 4 MG/2ML IJ SOLN
INTRAMUSCULAR | Status: DC | PRN
  Administered 2015-08-22: 4 mg via INTRAVENOUS

## 2015-08-22 MED ORDER — LIDOCAINE-EPINEPHRINE 0.5 %-1:200000 IJ SOLN
INTRAMUSCULAR | Status: DC | PRN
  Administered 2015-08-22: 5 mL

## 2015-08-22 MED ORDER — MENTHOL 3 MG MT LOZG
1.0000 | LOZENGE | OROMUCOSAL | Status: DC | PRN
Start: 2015-08-22 — End: 2015-08-23
  Administered 2015-08-22: 3 mg via ORAL
  Filled 2015-08-22: qty 9

## 2015-08-22 MED ORDER — FENTANYL CITRATE (PF) 100 MCG/2ML IJ SOLN
INTRAMUSCULAR | Status: DC | PRN
  Administered 2015-08-22: 50 ug via INTRAVENOUS
  Administered 2015-08-22 (×2): 100 ug via INTRAVENOUS
  Administered 2015-08-22 (×2): 50 ug via INTRAVENOUS
  Administered 2015-08-22 (×4): 100 ug via INTRAVENOUS

## 2015-08-22 MED ORDER — MUPIROCIN 2 % EX OINT
1.0000 "application " | TOPICAL_OINTMENT | Freq: Once | CUTANEOUS | Status: AC
  Administered 2015-08-22: 1 via TOPICAL
  Filled 2015-08-22: qty 22

## 2015-08-22 MED ORDER — PROPOFOL 10 MG/ML IV BOLUS
INTRAVENOUS | Status: AC
  Filled 2015-08-22: qty 20

## 2015-08-22 MED ORDER — TIZANIDINE HCL 4 MG PO TABS
4.0000 mg | ORAL_TABLET | Freq: Three times a day (TID) | ORAL | Status: DC
  Administered 2015-08-22 – 2015-08-23 (×2): 4 mg via ORAL
  Filled 2015-08-22 (×2): qty 1

## 2015-08-22 MED ORDER — OXYCODONE HCL 5 MG/5ML PO SOLN: 5.0000 mg | Freq: Once | ORAL | Status: DC | PRN

## 2015-08-22 MED ORDER — TRAZODONE HCL 100 MG PO TABS
200.0000 mg | ORAL_TABLET | Freq: Every day | ORAL | Status: DC
  Administered 2015-08-22: 200 mg via ORAL
  Filled 2015-08-22: qty 2

## 2015-08-22 MED ORDER — DEXTROSE 5 % IV SOLN
10.0000 mg | INTRAVENOUS | Status: DC | PRN
  Administered 2015-08-22: 40 ug/min via INTRAVENOUS

## 2015-08-22 MED ORDER — 0.9 % SODIUM CHLORIDE (POUR BTL) OPTIME
TOPICAL | Status: DC | PRN
  Administered 2015-08-22: 1000 mL

## 2015-08-22 MED ORDER — ROCURONIUM BROMIDE 100 MG/10ML IV SOLN
INTRAVENOUS | Status: DC | PRN
  Administered 2015-08-22 (×4): 20 mg via INTRAVENOUS
  Administered 2015-08-22: 50 mg via INTRAVENOUS

## 2015-08-22 MED ORDER — VENLAFAXINE HCL ER 75 MG PO CP24
150.0000 mg | ORAL_CAPSULE | Freq: Every day | ORAL | Status: DC
  Administered 2015-08-23: 150 mg via ORAL
  Filled 2015-08-22: qty 2

## 2015-08-22 MED ORDER — ACETAMINOPHEN 325 MG PO TABS: 650.0000 mg | ORAL_TABLET | ORAL | Status: DC | PRN

## 2015-08-22 MED ORDER — SUCCINYLCHOLINE CHLORIDE 200 MG/10ML IV SOSY
PREFILLED_SYRINGE | INTRAVENOUS | Status: AC
  Filled 2015-08-22: qty 10

## 2015-08-22 MED ORDER — FENTANYL CITRATE (PF) 100 MCG/2ML IJ SOLN
25.0000 ug | INTRAMUSCULAR | Status: DC | PRN
  Administered 2015-08-22 (×2): 50 ug via INTRAVENOUS

## 2015-08-22 MED ORDER — OXYCODONE-ACETAMINOPHEN 5-325 MG PO TABS
1.0000 | ORAL_TABLET | ORAL | Status: DC | PRN
  Administered 2015-08-22 – 2015-08-23 (×3): 2 via ORAL
  Filled 2015-08-22 (×3): qty 2

## 2015-08-22 MED ORDER — ONDANSETRON HCL 4 MG/2ML IJ SOLN
INTRAMUSCULAR | Status: AC
  Filled 2015-08-22: qty 2

## 2015-08-22 MED ORDER — DIAZEPAM 5 MG PO TABS
5.0000 mg | ORAL_TABLET | Freq: Four times a day (QID) | ORAL | Status: DC | PRN
  Administered 2015-08-22 – 2015-08-23 (×2): 5 mg via ORAL
  Filled 2015-08-22 (×2): qty 1

## 2015-08-22 MED ORDER — LIDOCAINE HCL (CARDIAC) 20 MG/ML IV SOLN
INTRAVENOUS | Status: DC | PRN
  Administered 2015-08-22: 40 mg via INTRAVENOUS

## 2015-08-22 MED ORDER — OXYCODONE HCL 5 MG PO TABS: 5.0000 mg | ORAL_TABLET | Freq: Once | ORAL | Status: DC | PRN

## 2015-08-22 MED ORDER — HYDROCODONE-ACETAMINOPHEN 5-325 MG PO TABS: 1.0000 | ORAL_TABLET | ORAL | Status: DC | PRN

## 2015-08-22 MED ORDER — POTASSIUM CHLORIDE IN NACL 20-0.9 MEQ/L-% IV SOLN
INTRAVENOUS | Status: DC
  Administered 2015-08-22: 1000 mL via INTRAVENOUS
  Filled 2015-08-22: qty 1000

## 2015-08-22 MED ORDER — FENTANYL CITRATE (PF) 100 MCG/2ML IJ SOLN
INTRAMUSCULAR | Status: AC
  Administered 2015-08-22: 50 ug via INTRAVENOUS
  Filled 2015-08-22: qty 2

## 2015-08-22 MED ORDER — SODIUM CHLORIDE 0.9% FLUSH
3.0000 mL | Freq: Two times a day (BID) | INTRAVENOUS | Status: DC
  Administered 2015-08-22: 3 mL via INTRAVENOUS

## 2015-08-22 MED ORDER — PROPOFOL 10 MG/ML IV BOLUS
INTRAVENOUS | Status: AC
  Filled 2015-08-22: qty 40

## 2015-08-22 MED ORDER — HEMOSTATIC AGENTS (NO CHARGE) OPTIME
TOPICAL | Status: DC | PRN
  Administered 2015-08-22: 1 via TOPICAL

## 2015-08-22 MED ORDER — ONDANSETRON HCL 4 MG/2ML IJ SOLN: 4.0000 mg | Freq: Once | INTRAMUSCULAR | Status: DC | PRN

## 2015-08-22 MED ORDER — LIDOCAINE 2% (20 MG/ML) 5 ML SYRINGE
INTRAMUSCULAR | Status: AC
  Filled 2015-08-22: qty 5

## 2015-08-22 MED ORDER — MORPHINE SULFATE (PF) 2 MG/ML IV SOLN
1.0000 mg | INTRAVENOUS | Status: DC | PRN
  Administered 2015-08-22: 4 mg via INTRAVENOUS
  Filled 2015-08-22: qty 2

## 2015-08-22 MED ORDER — MIDAZOLAM HCL 5 MG/5ML IJ SOLN
INTRAMUSCULAR | Status: DC | PRN
  Administered 2015-08-22: 2 mg via INTRAVENOUS

## 2015-08-22 SURGICAL SUPPLY — 66 items
BIT DRILL NEURO 2X3.1 SFT TUCH (MISCELLANEOUS) ×1 IMPLANT
BIT DRILL PRESTIGE (BIT) ×2 IMPLANT
BLADE CLIPPER SURG (BLADE) IMPLANT
BNDG GAUZE ELAST 4 BULKY (GAUZE/BANDAGES/DRESSINGS) IMPLANT
BUR DRUM 4.0 (BURR) ×2 IMPLANT
CAGE PRESTIGE 6X14 (Cage) ×2 IMPLANT
CANISTER SUCT 3000ML PPV (MISCELLANEOUS) ×2 IMPLANT
DECANTER SPIKE VIAL GLASS SM (MISCELLANEOUS) ×2 IMPLANT
DISC CERVICAL PRESTIGE 7X14 (Orthopedic Implant) ×2 IMPLANT
DRAPE C-ARM 42X72 X-RAY (DRAPES) ×2 IMPLANT
DRAPE C-ARMOR (DRAPES) ×2 IMPLANT
DRAPE LAPAROTOMY 100X72 PEDS (DRAPES) ×2 IMPLANT
DRAPE MICROSCOPE LEICA (MISCELLANEOUS) ×4 IMPLANT
DRAPE POUCH INSTRU U-SHP 10X18 (DRAPES) ×2 IMPLANT
DRAPE PROXIMA HALF (DRAPES) ×2 IMPLANT
DRILL NEURO 2X3.1 SOFT TOUCH (MISCELLANEOUS) ×2
DURAPREP 6ML APPLICATOR 50/CS (WOUND CARE) ×2 IMPLANT
ELECT COATED BLADE 2.86 ST (ELECTRODE) ×2 IMPLANT
ELECT REM PT RETURN 9FT ADLT (ELECTROSURGICAL) ×2
ELECTRODE REM PT RTRN 9FT ADLT (ELECTROSURGICAL) ×1 IMPLANT
GAUZE SPONGE 4X4 16PLY XRAY LF (GAUZE/BANDAGES/DRESSINGS) IMPLANT
GLOVE BIO SURGEON STRL SZ 6.5 (GLOVE) IMPLANT
GLOVE BIO SURGEON STRL SZ7 (GLOVE) IMPLANT
GLOVE BIO SURGEON STRL SZ7.5 (GLOVE) IMPLANT
GLOVE BIO SURGEON STRL SZ8 (GLOVE) IMPLANT
GLOVE BIO SURGEON STRL SZ8.5 (GLOVE) IMPLANT
GLOVE BIOGEL M 8.0 STRL (GLOVE) IMPLANT
GLOVE ECLIPSE 6.5 STRL STRAW (GLOVE) IMPLANT
GLOVE ECLIPSE 7.0 STRL STRAW (GLOVE) IMPLANT
GLOVE ECLIPSE 7.5 STRL STRAW (GLOVE) IMPLANT
GLOVE ECLIPSE 8.0 STRL XLNG CF (GLOVE) IMPLANT
GLOVE ECLIPSE 8.5 STRL (GLOVE) IMPLANT
GLOVE EXAM NITRILE LRG STRL (GLOVE) IMPLANT
GLOVE EXAM NITRILE MD LF STRL (GLOVE) IMPLANT
GLOVE EXAM NITRILE XL STR (GLOVE) IMPLANT
GLOVE EXAM NITRILE XS STR PU (GLOVE) IMPLANT
GLOVE INDICATOR 6.5 STRL GRN (GLOVE) IMPLANT
GLOVE INDICATOR 7.0 STRL GRN (GLOVE) IMPLANT
GLOVE INDICATOR 7.5 STRL GRN (GLOVE) IMPLANT
GLOVE INDICATOR 8.0 STRL GRN (GLOVE) IMPLANT
GLOVE INDICATOR 8.5 STRL (GLOVE) IMPLANT
GLOVE OPTIFIT SS 8.0 STRL (GLOVE) IMPLANT
GLOVE SURG SS PI 6.5 STRL IVOR (GLOVE) IMPLANT
GOWN STRL REUS W/ TWL LRG LVL3 (GOWN DISPOSABLE) ×2 IMPLANT
GOWN STRL REUS W/ TWL XL LVL3 (GOWN DISPOSABLE) IMPLANT
GOWN STRL REUS W/TWL 2XL LVL3 (GOWN DISPOSABLE) IMPLANT
GOWN STRL REUS W/TWL LRG LVL3 (GOWN DISPOSABLE) ×2
GOWN STRL REUS W/TWL XL LVL3 (GOWN DISPOSABLE)
KIT BASIN OR (CUSTOM PROCEDURE TRAY) ×2 IMPLANT
KIT ROOM TURNOVER OR (KITS) ×2 IMPLANT
LIQUID BAND (GAUZE/BANDAGES/DRESSINGS) ×2 IMPLANT
NEEDLE HYPO 25X1 1.5 SAFETY (NEEDLE) ×2 IMPLANT
NEEDLE SPNL 22GX3.5 QUINCKE BK (NEEDLE) ×2 IMPLANT
NS IRRIG 1000ML POUR BTL (IV SOLUTION) ×2 IMPLANT
PACK LAMINECTOMY NEURO (CUSTOM PROCEDURE TRAY) ×2 IMPLANT
PAD ARMBOARD 7.5X6 YLW CONV (MISCELLANEOUS) ×6 IMPLANT
PIN DISTRACTION 14MM (PIN) ×4 IMPLANT
RUBBERBAND STERILE (MISCELLANEOUS) ×4 IMPLANT
SPONGE INTESTINAL PEANUT (DISPOSABLE) ×2 IMPLANT
SPONGE SURGIFOAM ABS GEL SZ50 (HEMOSTASIS) ×2 IMPLANT
SUT VIC AB 0 CT1 27 (SUTURE) ×1
SUT VIC AB 0 CT1 27XBRD ANTBC (SUTURE) ×1 IMPLANT
SUT VIC AB 3-0 SH 8-18 (SUTURE) ×4 IMPLANT
TOWEL OR 17X24 6PK STRL BLUE (TOWEL DISPOSABLE) ×2 IMPLANT
TOWEL OR 17X26 10 PK STRL BLUE (TOWEL DISPOSABLE) ×2 IMPLANT
WATER STERILE IRR 1000ML POUR (IV SOLUTION) ×2 IMPLANT

## 2015-08-22 NOTE — Anesthesia Preprocedure Evaluation (Addendum)
Anesthesia Evaluation  Patient identified by MRN, date of birth, ID band Patient awake    Reviewed: Allergy & Precautions, NPO status , Patient's Chart, lab work & pertinent test results  Airway Mallampati: II  TM Distance: >3 FB Neck ROM: Full    Dental  (+) Teeth Intact, Dental Advisory Given   Pulmonary    breath sounds clear to auscultation       Cardiovascular  Rhythm:Regular Rate:Normal     Neuro/Psych    GI/Hepatic   Endo/Other  diabetes  Renal/GU      Musculoskeletal   Abdominal   Peds  Hematology   Anesthesia Other Findings   Reproductive/Obstetrics                            Anesthesia Physical Anesthesia Plan  ASA: II  Anesthesia Plan: General   Post-op Pain Management:  Regional for Post-op pain   Induction: Intravenous  Airway Management Planned: Oral ETT  Additional Equipment:   Intra-op Plan:   Post-operative Plan: Extubation in OR  Informed Consent: I have reviewed the patients History and Physical, chart, labs and discussed the procedure including the risks, benefits and alternatives for the proposed anesthesia with the patient or authorized representative who has indicated his/her understanding and acceptance.   Dental advisory given  Plan Discussed with: CRNA and Anesthesiologist  Anesthesia Plan Comments:         Anesthesia Quick Evaluation

## 2015-08-22 NOTE — Anesthesia Procedure Notes (Signed)
Procedure Name: Intubation Date/Time: 08/22/2015 12:32 PM Performed by: Izora Gala Pre-anesthesia Checklist: Patient identified, Emergency Drugs available, Suction available and Patient being monitored Patient Re-evaluated:Patient Re-evaluated prior to inductionOxygen Delivery Method: Circle system utilized Preoxygenation: Pre-oxygenation with 100% oxygen Intubation Type: IV induction Ventilation: Mask ventilation without difficulty Laryngoscope Size: Miller and 3 Grade View: Grade I Tube type: Oral Tube size: 7.0 mm Number of attempts: 1 Airway Equipment and Method: Stylet and LTA kit utilized Placement Confirmation: positive ETCO2,  ETT inserted through vocal cords under direct vision and breath sounds checked- equal and bilateral Secured at: 21 cm Tube secured with: Tape Dental Injury: Teeth and Oropharynx as per pre-operative assessment

## 2015-08-22 NOTE — Anesthesia Postprocedure Evaluation (Signed)
Anesthesia Post Note  Patient: Tammy Sandoval  Procedure(s) Performed: Procedure(s) (LRB): Cervical five - six Cervical six - seven arthroplasty (N/A)  Patient location during evaluation: PACU Anesthesia Type: General Level of consciousness: awake, awake and alert and oriented Pain management: pain level controlled Vital Signs Assessment: post-procedure vital signs reviewed and stable Respiratory status: spontaneous breathing, nonlabored ventilation and respiratory function stable Cardiovascular status: blood pressure returned to baseline Anesthetic complications: no    Last Vitals:  Filed Vitals:   08/22/15 1815 08/22/15 1828  BP:  122/69  Pulse:  104  Temp: 36.7 C 36.8 C  Resp:  16    Last Pain:  Filed Vitals:   08/22/15 1829  PainSc: 2                  JOSLIN,DAVID COKER

## 2015-08-22 NOTE — H&P (Signed)
BP 119/61 mmHg  Pulse 73  Temp(Src) 98.6 F (37 C) (Oral)  Resp 18  Ht 5' 3"  (1.6 m)  Wt 64.864 kg (143 lb)  BMI 25.34 kg/m2  SpO2 97% HOPI:                                                   [name] comes in today for evaluation of pain that she has in the neck.  She has had this pain over a number of years.  She actually had a scan performed in 2015, I believe, of her cervical spine.  She is 51, disabled, and right handed.  Ms. Jim's disability stems from low back problems.  With regard to the neck, in her words, she has pain in her neck and back, and has seen dealing with this for about five years; for years it is unbearable.  Pain in her neck, leg, and back, weakness in the legs and hands, tingling in her legs, toes, arms, and fingers.  She says she gets some numbness and tingling sensation at times.  She does have headaches.  She has had seizures.  She has lost consciousness.      She has received injections, she has had physical therapy, she has been taking pain medication all without lasting relief.  She states Tizanidine, which she has just been placed on has been the most effective drug in recent memory.      DATA:                                                  MRI of the cervical spine showed degenerative changes at 5-6 and 6-7 with disc space narrowing and osteophyte formation and foraminal narrowing.  She also had a CT done in 2015 of the cervical spine which confirms these findings and shows a significant degeneration at the disc spaces.      INTERVAL PFSH:                                  She does not smoke.  She does not use alcohol.  She does not have a history of illicit drug use.     PAST SURGICAL HISTORY:                  She has undergone a Cesarean section and an appendectomy.      PAST MEDICAL HISTORY:                        ALLERGIES:                                         No known drug allergies.     MEDICATIONS:                         She  takes Tizanidine, Methocarbamol, Gabapentin, Venlafaxine, Meclizine, Trazodone, Linzess, Omeprazole, Hydrocodone 1-/325 four tablets a day.     REVIEW OF SYSTEMS:  Review of systems is positive for night sweats, tinnitus, nose bleeds, nasal congestion, sinus problems, leg pain with walking, arm weakness, leg weakness, back pain, leg pain, joint pain, arthritis, neck pain, memory problems, blurred vision, anxiety, depression, excessive thirst and urination.  On the pain chart she shows pain across the shoulders, across the mid back, across the upper extremities, and across both lower extremities.      PHYSICAL EXAMINATION:                    Vital signs:  Height 5 feet 3 inches, weight 151.8 pounds.  Temperature is 98.9.  Blood pressure is 123/76.  Pulse is 73.  Respiratory rate is 14.  Pain is 6/10.     On exam, she is alert, oriented x4, and is answering all questions appropriately.  Memory, language, attention span, and fund of knowledge are normal.  Speech is clear; it is also fluent.  Tongue protrudes in the midline.  Uvula elevates in the midline.  Shoulder shrug is normal.  5/5 strength in the upper and lower extremities.  Normal muscle tone, bulk, and coordination.  Gait is normal.  Romberg is negative.  She has 2+ reflexes at the ankles and upper extremities bilaterally.  She would not allow me to check her knees as she said they are too painful.                  IMPRESSION/PLAN:                             I explained to Ms. Pettis that at this point I believe she might very well benefit from either cervical fusion or a cervical arthroplasty.  I am not sure if Medicare covers the artificial disc, but she has had time, therapy, injections, anti-inflammatories, all without relief.  If that bid is unsuccessful, she says she is willing to undergo an arthrodesis, but I do believe that the arthroplasty at this point is probably a slight advantage as it would not put as much  pressure on the adjacent levels.

## 2015-08-22 NOTE — Transfer of Care (Signed)
Immediate Anesthesia Transfer of Care Note  Patient: Tammy Sandoval  Procedure(s) Performed: Procedure(s) with comments: Cervical five - six Cervical six - seven arthroplasty (N/A) - C56 C67 arthroplasty  Patient Location: PACU  Anesthesia Type:General  Level of Consciousness: awake, sedated and patient cooperative  Airway & Oxygen Therapy: Patient Spontanous Breathing and Patient connected to nasal cannula oxygen  Post-op Assessment: Report given to RN, Post -op Vital signs reviewed and stable and Patient moving all extremities  Post vital signs: Reviewed and stable  Last Vitals:  Filed Vitals:   08/22/15 1626 08/22/15 1641  BP: 143/74 121/66  Pulse: 135 110  Temp: 37.1 C   Resp: 14 14    Last Pain:  Filed Vitals:   08/22/15 1652  PainSc: 7       Patients Stated Pain Goal: 3 (48/47/20 7218)  Complications: No apparent anesthesia complications

## 2015-08-23 DIAGNOSIS — M4722 Other spondylosis with radiculopathy, cervical region: Secondary | ICD-10-CM | POA: Diagnosis not present

## 2015-08-23 MED ORDER — PANTOPRAZOLE SODIUM 40 MG IV SOLR: 40.0000 mg | Freq: Two times a day (BID) | INTRAVENOUS | Status: DC

## 2015-08-23 MED ORDER — OXYCODONE-ACETAMINOPHEN 5-325 MG PO TABS: 1.0000 | ORAL_TABLET | ORAL | Status: DC | PRN

## 2015-08-23 NOTE — Progress Notes (Signed)
Pt left unit at 1118 with daughter. Prescription for pain med given. Wendee Copp

## 2015-08-23 NOTE — Progress Notes (Signed)
Pt discharged home with daughter. IV removed. Discharge instructions given. Pt questions asked and answered. Pt to leave unit when daughter is present to transport home. Wendee Copp

## 2015-08-23 NOTE — Discharge Summary (Signed)
Physician Discharge Summary  Patient ID: Tammy Sandoval MRN: [id number] DOB/AGE: 1955-07-07 60 y.o.  Admit date: 08/22/2015 Discharge date: 08/23/2015  Admission Diagnoses:  Cervical HNP  Discharge Diagnoses:   Cervical HNP Active Problems:   Osteoarthritis of spine with radiculopathy, cervical region   Discharged Condition: good  Hospital Course: Patient was admitted by Dr. Christella Noa for cervical disc herniation and performed a cervical disc arthroplasty. Postoperatively the patient has done well. Her incision is clean and dry. She is up and ambulating. She is voiding well. She is eating well. She has been given instructions regarding wound care and activities following discharge. She is to call Dr. Lacy Duverney office in 2 days and schedule a follow-up appointment for 3 weeks from now.  Discharge Exam: Blood pressure 105/47, pulse 84, temperature 97.3 F (36.3 C), temperature source Oral, resp. rate 18, height 5' 3"  (1.6 m), weight 64.864 kg (143 lb), SpO2 94 %.  Disposition:  home    Medication List    TAKE these medications        BC HEADACHE POWDER PO  Take 1 packet by mouth daily as needed (for headache).     gabapentin 800 MG tablet  Commonly known as:  NEURONTIN  Take 800 mg by mouth 3 (three) times daily.     HYDROcodone-acetaminophen 10-325 MG tablet  Commonly known as:  NORCO  Take 1 tablet by mouth 4 (four) times daily as needed for moderate pain.     LINZESS 145 MCG Caps capsule  Generic drug:  linaclotide  Take 145 mcg by mouth daily.     lubiprostone 24 MCG capsule  Commonly known as:  AMITIZA  Take 24 mcg by mouth 2 (two) times daily with a meal.     oxyCODONE-acetaminophen 5-325 MG tablet  Commonly known as:  PERCOCET/ROXICET  Take 1-2 tablets by mouth every 4 (four) hours as needed for moderate pain.     tiZANidine 4 MG tablet  Commonly known as:  ZANAFLEX  Take 4 mg by mouth 3 (three) times daily.     traZODone 100 MG tablet  Commonly known as:   DESYREL  Take 200 mg by mouth at bedtime.     venlafaxine XR 150 MG 24 hr capsule  Commonly known as:  EFFEXOR-XR  Take 150 mg by mouth daily.         SignedHosie Spangle 08/23/2015, 9:45 AM

## 2015-08-25 ENCOUNTER — Encounter (HOSPITAL_COMMUNITY): Payer: Self-pay | Admitting: Neurosurgery

## 2015-08-26 ENCOUNTER — Encounter (HOSPITAL_COMMUNITY): Payer: Self-pay | Admitting: Neurosurgery

## 2015-08-29 NOTE — Op Note (Signed)
08/22/2015  8:42 PM  PATIENT:  IVONE LICHT  60 y.o. female  PRE-OPERATIVE DIAGNOSIS:  cervical osteoarthritis C5/6, 6/7, with radiculopathy  POST-OPERATIVE DIAGNOSIS:  cervical osteoarthritis C5/6,6/7, with radiculopathy  PROCEDURE:  Anterior Cervical decompression C5/6,6/7 Arthroplasty C5/6, 6/7 with prestige LP artificial discs(medtronic)  SURGEON:   Surgeon(s): Ashok Pall, MD Consuella Lose, MD   ASSISTANTS:Nundkumar  ANESTHESIA:   general  EBL:     BLOOD ADMINISTERED:none  CELL SAVER GIVEN:none  COUNT:per nursing  DRAINS: none   SPECIMEN:  No Specimen  DICTATION: Mrs. Lekas was taken to the operating room, intubated, and placed under general anesthesia without difficulty. She was positioned supine with her head in slight extension on a horseshoe headrest. The neck was prepped and draped in a sterile manner. I infiltrated 4 cc's 1/2%lidocaine/1:200,000 strength epinephrine into the planned incision starting from the midline to the medial border of the left sternocleidomastoid muscle. I opened the incision with a 10 blade and dissected sharply through soft tissue to the platysma. I dissected in the plane superior to the platysma both rostrally and caudally. I then opened the platysma in a horizontal fashion with Metzenbaum scissors, and dissected in the inferior plane rostrally and caudally. With both blunt and sharp technique I created an avascular corridor to the cervical spine. I placed a spinal needle(s) in the disc space at 5/6 . I then reflected the longus colli from C5 to C7 and placed self retaining retractors. I opened the disc space(s) at 5/6,6/7 with a 15 blade. I removed disc with curettes, Kerrison punches, and the drill. Using the drill I removed osteophytes and prepared for the decompression.  I decompressed the spinal canal and the C6, and 7 root(s) with the drill, Kerrison punches, and the curettes. I used the microscope to aid in microdissection. I  removed the posterior longitudinal ligament to fully expose and decompress the thecal sac. I exposed the roots laterally taking down the 5/6, and 6/7 uncovertebral joints. With the decompression complete we moved on to the arthroplasties. We used the drill to level the surfaces of C5,6,and 7. I removed soft tissue to prepare the disc space and the bony surfaces. I measured the space and placed with fluoroscopic guidance Medtronic Prestige LP artificial discs into the disc spaces at 5/6, and C6/7.   I irrigated the wound, achieved hemostasis, and closed the wound in layers. We approximated the platysma, and the subcuticular plane with vicryl sutures. I used Dermabond for a sterile dressing.   PLAN OF CARE: Admit to inpatient   PATIENT DISPOSITION:  PACU - hemodynamically stable.   Delay start of Pharmacological VTE agent (>24hrs) due to surgical blood loss or risk of bleeding:  yes

## 2015-09-22 ENCOUNTER — Emergency Department (HOSPITAL_COMMUNITY)
Admission: EM | Admit: 2015-09-22 | Discharge: 2015-09-23 | Disposition: A | Discharge status: Other Acute Inpatient Hospital | Payer: Medicare Other | Attending: Emergency Medicine | Admitting: Emergency Medicine

## 2015-09-22 ENCOUNTER — Encounter (HOSPITAL_COMMUNITY): Payer: Self-pay | Admitting: Emergency Medicine

## 2015-09-22 DIAGNOSIS — Z85828 Personal history of other malignant neoplasm of skin: Secondary | ICD-10-CM | POA: Insufficient documentation

## 2015-09-22 DIAGNOSIS — Z91040 Latex allergy status: Secondary | ICD-10-CM | POA: Insufficient documentation

## 2015-09-22 DIAGNOSIS — Z7982 Long term (current) use of aspirin: Secondary | ICD-10-CM | POA: Insufficient documentation

## 2015-09-22 DIAGNOSIS — T444X4A Poisoning by predominantly alpha-adrenoreceptor agonists, undetermined, initial encounter: Secondary | ICD-10-CM | POA: Insufficient documentation

## 2015-09-22 DIAGNOSIS — F329 Major depressive disorder, single episode, unspecified: Secondary | ICD-10-CM | POA: Insufficient documentation

## 2015-09-22 DIAGNOSIS — T50904A Poisoning by unspecified drugs, medicaments and biological substances, undetermined, initial encounter: Secondary | ICD-10-CM

## 2015-09-22 LAB — COMPREHENSIVE METABOLIC PANEL
ALT: 15 U/L (ref 14–54)
AST: 17 U/L (ref 15–41)
Albumin: 3.6 g/dL (ref 3.5–5.0)
Alkaline Phosphatase: 60 U/L (ref 38–126)
Anion gap: 6 (ref 5–15)
BUN: 13 mg/dL (ref 6–20)
CO2: 28 mmol/L (ref 22–32)
Calcium: 8.9 mg/dL (ref 8.9–10.3)
Chloride: 109 mmol/L (ref 101–111)
Creatinine, Ser: 0.69 mg/dL (ref 0.44–1.00)
GFR calc Af Amer: >60 mL/min (ref >60)
GFR calc non Af Amer: >60 mL/min (ref >60)
Glucose, Bld: 93 mg/dL (ref 65–99)
Potassium: 3.6 mmol/L (ref 3.5–5.1)
Sodium: 143 mmol/L (ref 135–145)
Total Bilirubin: 0.3 mg/dL (ref 0.3–1.2)
Total Protein: 6.7 g/dL (ref 6.5–8.1)

## 2015-09-22 LAB — CBC WITH DIFFERENTIAL/PLATELET
Basophils Absolute: 0 10*3/uL (ref 0.0–0.1)
Basophils Relative: 0 %
Eosinophils Absolute: 0.1 10*3/uL (ref 0.0–0.7)
Eosinophils Relative: 3 %
HCT: 35.3 % — ABNORMAL LOW (ref 36.0–46.0)
Hemoglobin: 11.6 g/dL — ABNORMAL LOW (ref 12.0–15.0)
Lymphocytes Relative: 32 %
Lymphs Abs: 1.6 10*3/uL (ref 0.7–4.0)
MCH: 29.4 pg (ref 26.0–34.0)
MCHC: 32.9 g/dL (ref 30.0–36.0)
MCV: 89.6 fL (ref 78.0–100.0)
Monocytes Absolute: 0.3 10*3/uL (ref 0.1–1.0)
Monocytes Relative: 6 %
Neutro Abs: 2.8 10*3/uL (ref 1.7–7.7)
Neutrophils Relative %: 59 %
Platelets: 295 10*3/uL (ref 150–400)
RBC: 3.94 MIL/uL (ref 3.87–5.11)
RDW: 14 % (ref 11.5–15.5)
WBC: 4.8 10*3/uL (ref 4.0–10.5)

## 2015-09-22 LAB — RAPID URINE DRUG SCREEN, HOSP PERFORMED
Amphetamines: NOT DETECTED
Barbiturates: NOT DETECTED
Benzodiazepines: NOT DETECTED
Cocaine: NOT DETECTED
Opiates: NOT DETECTED
Tetrahydrocannabinol: NOT DETECTED

## 2015-09-22 LAB — ETHANOL: Alcohol, Ethyl (B): <5 mg/dL (ref <5)

## 2015-09-22 LAB — SALICYLATE LEVEL
Salicylate Lvl: 10.5 mg/dL (ref 2.8–30.0)
Salicylate Lvl: 10.5 mg/dL (ref 2.8–30.0)

## 2015-09-22 LAB — ACETAMINOPHEN LEVEL: Acetaminophen (Tylenol), Serum: <10 ug/mL — ABNORMAL LOW (ref 10–30)

## 2015-09-22 MED ORDER — ACETAMINOPHEN 500 MG PO TABS: 1000.0000 mg | ORAL_TABLET | Freq: Once | ORAL | Status: DC

## 2015-09-22 MED ORDER — BUTALBITAL-APAP-CAFFEINE 50-325-40 MG PO TABS
1.0000 | ORAL_TABLET | Freq: Once | ORAL | Status: AC
  Administered 2015-09-22: 1 via ORAL
  Filled 2015-09-22: qty 1

## 2015-09-22 NOTE — ED Provider Notes (Signed)
CSN: [id number]     Arrival date & time 09/22/15  1144 History   First MD Initiated Contact with Patient 09/22/15 1152     Chief Complaint  Patient presents with  . Drug Overdose  . Depression     (Consider location/radiation/quality/duration/timing/severity/associated sxs/prior Treatment) Patient is a 60 y.o. female presenting with Overdose and depression. The history is provided by the patient.  Drug Overdose This is a new problem. The current episode started less than 1 hour ago. The problem occurs constantly. The problem has not changed since onset.Pertinent negatives include no chest pain, no headaches and no shortness of breath. Nothing aggravates the symptoms. Nothing relieves the symptoms. She has tried nothing for the symptoms. The treatment provided no relief.  Depression Pertinent negatives include no chest pain, no headaches and no shortness of breath.   60 yo F With a chief complaints of a drug overdose. Patient took 3 Klonopin to try and go to sleep. She stated that she was wanting to sleep she was tired with dealing with life. Feels that the [facility] health care at [facility] was not helpful for her. She feels depressed and alone. Her daughter was concerned by how sleepy she was today so she called 911. The patient denies that this was an attempt on her life. She is unsure exactly what she took. Did take multiple BC powders this morning for what she calls a headache.  Past Medical History  Diagnosis Date  . Ulcer     RLE  . Low back pain   . GERD (gastroesophageal reflux disease)   . Obesity   . Irritable bowel syndrome   . Thrombophlebitis   . Depression   . Anemia   . Generalized headaches   . Leg pain   . Insomnia   . Crescendo transient ischemic attacks   . Palpitations   . Varicose veins   . Seizures ([place]) 2013    only one she has ever had, placed on neurontin  . Cancer [facility])     skin-surgically removed  . Restless leg   . Gestational diabetes    Past Surgical  History  Procedure Laterality Date  . Appendectomy    . Cesarean section    . Breast lumpectomy      bilateral  . Leg surgery  2004    mass removed on right leg  . Skin cancer removal  2012 & 2013    2 places on face  . Cervical disc arthroplasty N/A 08/22/2015    Procedure: Cervical five - six Cervical six - seven arthroplasty;  Surgeon: Ashok Pall, MD;  Location: [place] NEURO ORS;  Service: Neurosurgery;  Laterality: N/A;  C56 C67 arthroplasty   Family History  Problem Relation Age of Onset  . Cancer Mother     pancreatic and breast  . Deep vein thrombosis Mother   . Diabetes Mother   . Heart disease Mother   . Other Mother     varicose veing  . Heart disease Father   . Hyperlipidemia Father   . Hypertension Father   . Heart attack Father   . Diabetes Brother   . Heart attack Brother    Social History  Substance Use Topics  . Smoking status: Never Smoker   . Smokeless tobacco: Never Used  . Alcohol Use: No   OB History    No data available     Review of Systems  Constitutional: Negative for fever and chills.  HENT: Negative for congestion and rhinorrhea.  Eyes: Negative for redness and visual disturbance.  Respiratory: Negative for shortness of breath and wheezing.   Cardiovascular: Negative for chest pain and palpitations.  Gastrointestinal: Negative for nausea and vomiting.  Genitourinary: Negative for dysuria and urgency.  Musculoskeletal: Negative for myalgias and arthralgias.  Skin: Negative for pallor and wound.  Neurological: Negative for dizziness and headaches.  Psychiatric/Behavioral: Positive for depression and dysphoric mood. Negative for suicidal ideas, sleep disturbance and self-injury.      Allergies  Latex and Wellbutrin  Home Medications   Prior to Admission medications   Medication Sig Start Date End Date Taking? Authorizing Provider  Aspirin-Salicylamide-Caffeine (BC HEADACHE POWDER PO) Take 1 packet by mouth every 3 (three) hours as  needed (for headache).    Yes Historical Provider, MD  clonazePAM (KLONOPIN) 0.5 MG tablet Take 0.5-1.5 mg by mouth at bedtime as needed (for sleep).   Yes Historical Provider, MD  gabapentin (NEURONTIN) 800 MG tablet Take 800 mg by mouth 3 (three) times daily.   Yes Historical Provider, MD  LINZESS 145 MCG CAPS capsule Take 145 mcg by mouth daily. 08/04/15  Yes Historical Provider, MD  meclizine (ANTIVERT) 25 MG tablet Take 25 mg by mouth 3 (three) times daily as needed for dizziness.   Yes Historical Provider, MD  omeprazole (PRILOSEC) 20 MG capsule Take 20 mg by mouth 2 (two) times daily before a meal.   Yes Historical Provider, MD  oxyCODONE-acetaminophen (PERCOCET/ROXICET) 5-325 MG tablet Take 1-2 tablets by mouth every 4 (four) hours as needed for moderate pain. Patient taking differently: Take 1-2 tablets by mouth 3 (three) times daily as needed for moderate pain. Takes 2 tablets in the morning and 1 tablet in the afternoon and at night 08/23/15  Yes Jovita Gamma, MD  tiZANidine (ZANAFLEX) 4 MG tablet Take 4-8 mg by mouth 3 (three) times daily as needed for muscle spasms.  06/25/15  Yes Historical Provider, MD  traZODone (DESYREL) 100 MG tablet Take 200 mg by mouth at bedtime. 07/08/15  Yes Historical Provider, MD  venlafaxine XR (EFFEXOR-XR) 150 MG 24 hr capsule Take 150 mg by mouth daily. 07/11/15  Yes Historical Provider, MD   BP 132/78 mmHg  Pulse 95  Temp(Src) 98.4 F (36.9 C) (Oral)  Resp 18  SpO2 100% Physical Exam  Constitutional: She is oriented to person, place, and time. She appears well-developed and well-nourished. No distress.  HENT:  Head: Normocephalic and atraumatic.  Eyes: EOM are normal. Pupils are equal, round, and reactive to light.  Neck: Normal range of motion. Neck supple.  Cardiovascular: Normal rate and regular rhythm.  Exam reveals no gallop and no friction rub.   No murmur heard. Pulmonary/Chest: Effort normal. She has no wheezes. She has no rales.   Abdominal: Soft. She exhibits no distension. There is no tenderness. There is no rebound and no guarding.  Musculoskeletal: She exhibits no edema or tenderness.  Neurological: She is alert and oriented to person, place, and time.  Mildly drowsy on exam  Skin: Skin is warm and dry. She is not diaphoretic.  Psychiatric: She has a normal mood and affect. Her behavior is normal.  Nursing note and vitals reviewed.   ED Course  Procedures (including critical care time) Labs Review Labs Reviewed  CBC WITH DIFFERENTIAL/PLATELET - Abnormal; Notable for the following:    Hemoglobin 11.6 (*)    HCT 35.3 (*)    All other components within normal limits  ACETAMINOPHEN LEVEL - Abnormal; Notable for the following:  Acetaminophen (Tylenol), Serum <10 (*)    All other components within normal limits  COMPREHENSIVE METABOLIC PANEL  ETHANOL  URINE RAPID DRUG SCREEN, HOSP PERFORMED  SALICYLATE LEVEL    Imaging Review No results found. I have personally reviewed and evaluated these images and lab results as part of my medical decision-making.   EKG Interpretation   Date/Time:  Monday September 22 2015 12:04:47 EDT Ventricular Rate:  81 PR Interval:  138 QRS Duration: 100 QT Interval:  397 QTC Calculation: 461 R Axis:   34 Text Interpretation:  Sinus rhythm Borderline T abnormalities, anterior  leads Rate slower Otherwise no significant change Confirmed by FLOYD MD,  Quillian Quince ([id number]) on 09/22/2015 2:27:25 PM      MDM   Final diagnoses:  Overdose, undetermined intent, initial encounter    60 yo F with a chief complaint of a drug overdose. Patient is not sure exactly what she took no EMS seems to think she took 3 Klonopin tablets. Patient also took some BC powders denies alcohol or other illegal drugs. Patient appears clinically depressed on my exam having multiple possible red flags for suicide attempt referring to is going to sleep and not doing with life. Will have TTS evaluate.   They  will observe the patient overnight. Reassess in the morning.  The patients results and plan were reviewed and discussed.   Any x-rays performed were independently reviewed by myself.   Differential diagnosis were considered with the presenting HPI.  Medications  butalbital-acetaminophen-caffeine (FIORICET, ESGIC) 50-325-40 MG per tablet 1 tablet (1 tablet Oral Given 09/22/15 1447)    Filed Vitals:   09/22/15 1200 09/22/15 1230 09/22/15 1300 09/22/15 1330  BP: 115/70 114/69 110/66 132/78  Pulse: 79 80 79 95  Temp:      TempSrc:      Resp:  19 14 18   SpO2: 96% 97% 98% 100%    Final diagnoses:  Overdose, undetermined intent, initial encounter      Deno Etienne, DO 09/22/15 1754

## 2015-09-22 NOTE — ED Notes (Signed)
Pt has pt belonging bag by nursing station near room 16 with black shorts, pink shirt, flip flops, and grey jacket.   Pt has possession of glasses, tissue paper, and dental partial at bedside.  All other belongings sent home with daughter.

## 2015-09-22 NOTE — ED Notes (Addendum)
Per EMS pt intentional overdose three 0.5 mg Klonopin in attempt to get some sleep. Pt denies SI/HI but reports depression.
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
## 2015-09-22 NOTE — ED Notes (Signed)
Tammy Sandoval at bedside.

## 2015-09-22 NOTE — ED Notes (Signed)
Bed: EG56 Expected date:  Expected time:  Means of arrival:  Comments: RM16

## 2015-09-22 NOTE — ED Notes (Signed)
Per Staff, Pt has been changed into scrubs and wanded by security.

## 2015-09-22 NOTE — ED Notes (Signed)
Spoke with Blanch Media from [facility] control patient needs a repeat salicylate level in three hours. Tori, AC updated on patient status.

## 2015-09-23 ENCOUNTER — Observation Stay (HOSPITAL_COMMUNITY)
Admission: AD | Admit: 2015-09-23 | Discharge: 2015-09-23 | Disposition: A | Discharge status: Home / Self Care | Payer: Medicare Other | Source: Intra-hospital | Attending: Psychiatry | Admitting: Psychiatry

## 2015-09-23 ENCOUNTER — Encounter (HOSPITAL_COMMUNITY): Payer: Self-pay | Admitting: *Deleted

## 2015-09-23 DIAGNOSIS — Z8673 Personal history of transient ischemic attack (TIA), and cerebral infarction without residual deficits: Secondary | ICD-10-CM | POA: Insufficient documentation

## 2015-09-23 DIAGNOSIS — I872 Venous insufficiency (chronic) (peripheral): Secondary | ICD-10-CM | POA: Diagnosis not present

## 2015-09-23 DIAGNOSIS — G2581 Restless legs syndrome: Secondary | ICD-10-CM | POA: Insufficient documentation

## 2015-09-23 DIAGNOSIS — Z888 Allergy status to other drugs, medicaments and biological substances status: Secondary | ICD-10-CM | POA: Diagnosis not present

## 2015-09-23 DIAGNOSIS — F329 Major depressive disorder, single episode, unspecified: Secondary | ICD-10-CM | POA: Diagnosis present

## 2015-09-23 DIAGNOSIS — Z85828 Personal history of other malignant neoplasm of skin: Secondary | ICD-10-CM | POA: Insufficient documentation

## 2015-09-23 DIAGNOSIS — K589 Irritable bowel syndrome without diarrhea: Secondary | ICD-10-CM | POA: Diagnosis not present

## 2015-09-23 DIAGNOSIS — K219 Gastro-esophageal reflux disease without esophagitis: Secondary | ICD-10-CM | POA: Diagnosis not present

## 2015-09-23 DIAGNOSIS — F332 Major depressive disorder, recurrent severe without psychotic features: Secondary | ICD-10-CM | POA: Diagnosis present

## 2015-09-23 DIAGNOSIS — R569 Unspecified convulsions: Secondary | ICD-10-CM | POA: Insufficient documentation

## 2015-09-23 DIAGNOSIS — Z91040 Latex allergy status: Secondary | ICD-10-CM | POA: Diagnosis not present

## 2015-09-23 DIAGNOSIS — M4722 Other spondylosis with radiculopathy, cervical region: Secondary | ICD-10-CM | POA: Insufficient documentation

## 2015-09-23 DIAGNOSIS — I83899 Varicose veins of unspecified lower extremities with other complications: Secondary | ICD-10-CM | POA: Insufficient documentation

## 2015-09-23 LAB — SALICYLATE LEVEL: Salicylate Lvl: 5.8 mg/dL (ref 2.8–30.0)

## 2015-09-23 MED ORDER — GABAPENTIN 800 MG PO TABS: 800.0000 mg | ORAL_TABLET | Freq: Three times a day (TID) | ORAL | Status: DC

## 2015-09-23 MED ORDER — TRAZODONE HCL 100 MG PO TABS: 200.0000 mg | ORAL_TABLET | Freq: Every day | ORAL | Status: DC

## 2015-09-23 MED ORDER — LINACLOTIDE 145 MCG PO CAPS
145.0000 ug | ORAL_CAPSULE | Freq: Every day | ORAL | Status: DC
  Administered 2015-09-23: 145 ug via ORAL
  Filled 2015-09-23 (×3): qty 1

## 2015-09-23 MED ORDER — GABAPENTIN 800 MG PO TABS
800.0000 mg | ORAL_TABLET | Freq: Three times a day (TID) | ORAL | Status: DC
  Administered 2015-09-23 (×3): 800 mg via ORAL
  Filled 2015-09-23 (×3): qty 1

## 2015-09-23 MED ORDER — LINACLOTIDE 145 MCG PO CAPS: 145.0000 ug | ORAL_CAPSULE | Freq: Every day | ORAL | Status: DC

## 2015-09-23 MED ORDER — VENLAFAXINE HCL ER 150 MG PO CP24: 150.0000 mg | ORAL_CAPSULE | Freq: Every day | ORAL | Status: AC

## 2015-09-23 MED ORDER — HYDROXYZINE HCL 25 MG PO TABS: 25.0000 mg | ORAL_TABLET | Freq: Four times a day (QID) | ORAL | Status: DC | PRN

## 2015-09-23 MED ORDER — VENLAFAXINE HCL ER 150 MG PO CP24
150.0000 mg | ORAL_CAPSULE | Freq: Every day | ORAL | Status: DC
  Administered 2015-09-23: 150 mg via ORAL
  Filled 2015-09-23: qty 1

## 2015-09-23 MED ORDER — IBUPROFEN 600 MG PO TABS
600.0000 mg | ORAL_TABLET | Freq: Once | ORAL | Status: AC
  Administered 2015-09-23: 600 mg via ORAL

## 2015-09-23 MED ORDER — ARIPIPRAZOLE 2 MG PO TABS: 2.0000 mg | ORAL_TABLET | Freq: Every day | ORAL | Status: DC

## 2015-09-23 MED ORDER — IBUPROFEN 600 MG PO TABS
ORAL_TABLET | ORAL | Status: AC
  Filled 2015-09-23: qty 1

## 2015-09-23 MED ORDER — OMEPRAZOLE 20 MG PO CPDR: 20.0000 mg | DELAYED_RELEASE_CAPSULE | Freq: Two times a day (BID) | ORAL | Status: DC

## 2015-09-23 MED ORDER — OXYCODONE-ACETAMINOPHEN 5-325 MG PO TABS
1.0000 | ORAL_TABLET | Freq: Once | ORAL | Status: AC
  Administered 2015-09-23: 1 via ORAL
  Filled 2015-09-23: qty 1

## 2015-09-23 MED ORDER — ARIPIPRAZOLE 2 MG PO TABS
2.0000 mg | ORAL_TABLET | Freq: Every day | ORAL | Status: DC
  Administered 2015-09-23: 2 mg via ORAL
  Filled 2015-09-23 (×2): qty 1

## 2015-09-23 NOTE — ED Notes (Signed)
Blanch Media from [facility] control informed patient case is now closed out. Tori, [facility] informed will continue with transfer.
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
## 2015-09-23 NOTE — Progress Notes (Signed)
Patient ID: Tammy Sandoval, female   DOB: 02-Jul-1955, 60 y.o.   MRN: [id number] PER STATE REGULATIONS 482.30  THIS CHART WAS REVIEWED FOR MEDICAL NECESSITY WITH RESPECT TO THE PATIENT'S ADMISSION/DURATION OF STAY.  NEXT REVIEW DATE:09/27/15  Roma Schanz, RN, BSN CASE MANAGER

## 2015-09-23 NOTE — Progress Notes (Signed)
Assisted in referring pt to [facility] for outpatient psychiatric follow up. With signed ROI, faxed pt's demographics and d/c summary. Amber called back to state pt has appointment 10/03/15 at 3pm with Dr. Darleene Cleaver. Entered information in pt's d/c summary.

## 2015-09-23 NOTE — Progress Notes (Signed)
D: Pt d/c home as per order and was picked up by her daughter at the main entrance.  A: Scheduled medications administered as prescribed. D/C instructions reviewed with pt including outside appointment and prescriptions. All belongings in locker 19 returned to pt prior to departure from facility. Support and encouragement offered. Safety maintained throughout stay in Obs. Unit without self harm gestures or outburst to note at present. R: Pt receptive to care. Denies SI, HI, AVH and pain at time of d/c assessment. Cooperative with unit routines. Compliant with medications as ordered when offered. Verbalized understanding related to d/c instructions. Pt signed belonging sheet in agreement with items received from locker 19. Appears to be in no physical distress at time of departure from facility.
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
## 2015-09-23 NOTE — ED Notes (Signed)
Patient discharged via ambulatory with steady gait to [facility]. Patient signed for belongings and discharged with [facility] transportation staff. No acute distress noted.

## 2015-09-23 NOTE — Progress Notes (Signed)
[place] INPATIENT:  Family/Significant Other Suicide Prevention Education  Suicide Prevention Education:  Patient Refusal for Family/Significant Other Suicide Prevention Education: The patient Tammy Sandoval has refused to provide written consent for family/significant other to be provided Family/Significant Other Suicide Prevention Education during admission and/or prior to discharge.  Physician notified.  Lubertha [facility] 09/23/2015, 3:22 AM
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
## 2016-01-29 ENCOUNTER — Ambulatory Visit: Payer: Self-pay | Admitting: Orthopedic Surgery

## 2016-02-12 ENCOUNTER — Ambulatory Visit: Payer: Self-pay | Admitting: Orthopedic Surgery

## 2016-02-12 ENCOUNTER — Encounter (HOSPITAL_COMMUNITY): Payer: Self-pay

## 2016-02-12 ENCOUNTER — Encounter (HOSPITAL_COMMUNITY)
Admission: RE | Admit: 2016-02-12 | Discharge: 2016-02-12 | Disposition: A | Discharge status: Home / Self Care | Payer: Medicare Other | Source: Ambulatory Visit | Attending: Orthopedic Surgery | Admitting: Orthopedic Surgery

## 2016-02-12 ENCOUNTER — Other Ambulatory Visit (HOSPITAL_COMMUNITY): Payer: Self-pay | Admitting: *Deleted

## 2016-02-12 DIAGNOSIS — Z01812 Encounter for preprocedural laboratory examination: Secondary | ICD-10-CM | POA: Insufficient documentation

## 2016-02-12 HISTORY — DX: Anxiety disorder, unspecified: F41.9

## 2016-02-12 HISTORY — DX: Unspecified osteoarthritis, unspecified site: M19.90

## 2016-02-12 HISTORY — DX: Dyspnea, unspecified: R06.00

## 2016-02-12 LAB — CBC
HCT: 41 % (ref 36.0–46.0)
Hemoglobin: 13.3 g/dL (ref 12.0–15.0)
MCH: 29.6 pg (ref 26.0–34.0)
MCHC: 32.4 g/dL (ref 30.0–36.0)
MCV: 91.3 fL (ref 78.0–100.0)
Platelets: 312 10*3/uL (ref 150–400)
RBC: 4.49 MIL/uL (ref 3.87–5.11)
RDW: 14.1 % (ref 11.5–15.5)
WBC: 5.9 10*3/uL (ref 4.0–10.5)

## 2016-02-12 LAB — BASIC METABOLIC PANEL
Anion gap: 7 (ref 5–15)
BUN: 12 mg/dL (ref 6–20)
CO2: 27 mmol/L (ref 22–32)
Calcium: 9.1 mg/dL (ref 8.9–10.3)
Chloride: 105 mmol/L (ref 101–111)
Creatinine, Ser: 0.71 mg/dL (ref 0.44–1.00)
GFR calc Af Amer: >60 mL/min (ref >60)
GFR calc non Af Amer: >60 mL/min (ref >60)
Glucose, Bld: 92 mg/dL (ref 65–99)
Potassium: 3.9 mmol/L (ref 3.5–5.1)
Sodium: 139 mmol/L (ref 135–145)

## 2016-02-12 LAB — ABO/RH: ABO/RH(D): AB POS

## 2016-02-12 LAB — TYPE AND SCREEN
ABO/RH(D): AB POS
Antibody Screen: NEGATIVE

## 2016-02-12 LAB — SURGICAL PCR SCREEN
MRSA, PCR: NEGATIVE
Staphylococcus aureus: POSITIVE — AB

## 2016-02-12 NOTE — Progress Notes (Addendum)
Pt denies cardiac history, chest pain or sob. Has panic attacks and a couple of years ago had one that caused chest pain and she had a stress test done. States it was normal. Have requested it from [facility]. Pt's dentist has not cleared her for surgery until she has a deep cleaning. Pt states she has an appt with dentist on 02/20/16

## 2016-02-12 NOTE — H&P (Signed)
TOTAL KNEE ADMISSION H&P  Patient is being admitted for right total knee arthroplasty.  Subjective:  Chief Complaint:right knee pain.  HPI: Tammy Sandoval, 60 y.o. female, has a history of pain and functional disability in the right knee due to arthritis and has failed non-surgical conservative treatments for greater than 12 weeks to includeNSAID's and/or analgesics, corticosteriod injections, flexibility and strengthening excercises, use of assistive devices, weight reduction as appropriate and activity modification.  Onset of symptoms was gradual, starting 10 years ago with gradually worsening course since that time. The patient noted prior procedures on the knee to include  arthroscopy on the right knee(s).  Patient currently rates pain in the right knee(s) at 10 out of 10 with activity. Patient has night pain, worsening of pain with activity and weight bearing, pain that interferes with activities of daily living, pain with passive range of motion, crepitus and joint swelling.  Patient has evidence of subchondral cysts, subchondral sclerosis, periarticular osteophytes and joint space narrowing by imaging studies. There is no active infection.  Patient Active Problem List   Diagnosis Date Noted  . Major depression 09/23/2015  . Osteoarthritis of spine with radiculopathy, cervical region 08/22/2015  . Varicose veins of lower extremities with other complications 62/22/9798  . Chronic venous insufficiency 08/13/2011   Past Medical History:  Diagnosis Date  . Anemia   . Cancer [facility])    skin-surgically removed  . Crescendo transient ischemic attacks   . Depression   . Generalized headaches   . GERD (gastroesophageal reflux disease)   . Gestational diabetes   . Insomnia   . Irritable bowel syndrome   . Leg pain   . Low back pain   . Obesity   . Palpitations   . Restless leg   . Seizures ([place]) 2013   only one she has ever had, placed on neurontin  . Thrombophlebitis   . Ulcer ([place])     RLE  . Varicose veins     Past Surgical History:  Procedure Laterality Date  . APPENDECTOMY    . BREAST LUMPECTOMY     bilateral  . CERVICAL DISC ARTHROPLASTY N/A 08/22/2015   Procedure: Cervical five - six Cervical six - seven arthroplasty;  Surgeon: Ashok Pall, MD;  Location: [place] NEURO ORS;  Service: Neurosurgery;  Laterality: N/A;  C56 C67 arthroplasty  . CESAREAN SECTION    . LEG SURGERY  2004   mass removed on right leg  . skin cancer removal  2012 & 2013   2 places on face     (Not in a hospital admission) Allergies  Allergen Reactions  . Latex Swelling    Redness,swelling  . Wellbutrin [Bupropion Hcl] Itching and Other (See Comments)    Made head itch and tingle. Also made patient loopy.    Social History  Substance Use Topics  . Smoking status: Never Smoker  . Smokeless tobacco: Never Used  . Alcohol use No    Family History  Problem Relation Age of Onset  . Cancer Mother     pancreatic and breast  . Deep vein thrombosis Mother   . Diabetes Mother   . Heart disease Mother   . Other Mother     varicose veing  . Heart disease Father   . Hyperlipidemia Father   . Hypertension Father   . Heart attack Father   . Diabetes Brother   . Heart attack Brother      Review of Systems  Constitutional: Positive for malaise/fatigue.  HENT:  Negative.   Eyes: Positive for blurred vision.  Respiratory: Positive for shortness of breath.   Gastrointestinal: Positive for constipation.  Genitourinary: Negative.   Musculoskeletal: Positive for back pain and joint pain.  Skin: Negative.   Neurological: Negative.   Endo/Heme/Allergies: Negative.   Psychiatric/Behavioral: Positive for depression. The patient is nervous/anxious.     Objective:  Physical Exam  Vitals reviewed. Constitutional: She is oriented to person, place, and time. She appears well-developed and well-nourished.  HENT:  Head: Normocephalic and atraumatic.  Eyes: Conjunctivae and EOM are normal.  Pupils are equal, round, and reactive to light.  Neck: Normal range of motion. Neck supple.  Cardiovascular: Normal rate, regular rhythm and intact distal pulses.   Respiratory: Effort normal. No respiratory distress.  GI: Soft. She exhibits no distension.  Genitourinary:  Genitourinary Comments: deferred  Musculoskeletal:       Right knee: She exhibits decreased range of motion, swelling, effusion and abnormal alignment. Tenderness found. Medial joint line and lateral joint line tenderness noted.  10-115  Neurological: She is alert and oriented to person, place, and time. She has normal reflexes.  Skin: Skin is warm and dry.  Psychiatric: She has a normal mood and affect. Her behavior is normal. Judgment and thought content normal.    Vital signs in last 24 hours: @VSRANGES @  Labs:   Estimated body mass index is 25.33 kg/m as calculated from the following:   Height as of 09/23/15: 5' 3"  (1.6 m).   Weight as of 09/23/15: 64.9 kg (143 lb).   Imaging Review Plain radiographs demonstrate severe degenerative joint disease of the right knee(s). The overall alignment issignificant varus. The bone quality appears to be adequate for age and reported activity level.  Assessment/Plan:  End stage arthritis, right knee   The patient history, physical examination, clinical judgment of the provider and imaging studies are consistent with end stage degenerative joint disease of the right knee(s) and total knee arthroplasty is deemed medically necessary. The treatment options including medical management, injection therapy arthroscopy and arthroplasty were discussed at length. The risks and benefits of total knee arthroplasty were presented and reviewed. The risks due to aseptic loosening, infection, stiffness, patella tracking problems, thromboembolic complications and other imponderables were discussed. The patient acknowledged the explanation, agreed to proceed with the plan and consent was signed.  Patient is being admitted for inpatient treatment for surgery, pain control, PT, OT, prophylactic antibiotics, VTE prophylaxis, progressive ambulation and ADL's and discharge planning. The patient is planning to be discharged home with home health services. (+) TXA, ASA

## 2016-02-12 NOTE — Progress Notes (Signed)
Mupirocin Ointment Rx called into [facility] on [address]. Left message on pt's voicemail informing pt of results and need to pick up Rx.

## 2016-02-12 NOTE — Pre-Procedure Instructions (Signed)
Tammy Sandoval  02/12/2016     Your procedure is scheduled on Monday, February 23, 2016 at 11:45 AM.   Report to [facility] Entr[facility] "A" Admitting Office at 9:45 AM.   Call this number if you have problems the morning of surgery: [phone]   Questions prior to day of surgery, please call [phone] between 8 & 4 PM.   Remember:  Do not eat food or drink liquids after midnight Sunday, 02/22/16.  Take these medicines the morning of surgery with A SIP OF WATER: Aripiprazole (Abilify), Gabapentin (Neurontin), Omeprazole (Prilosec), Venlafaxine (Effexor), Oxycodone - if needed  Stop all Aspirin products (BC powders, Goody's, etc.) 7 days prior to surgery. Do not use NSAIDS (Ibuprofen, Aleve, etc.) 7 days prior to surgery.   Do not wear jewelry, make-up or nail polish.  Do not wear lotions, powders, or perfumes.  Do not shave 48 hours prior to surgery.    Do not bring valuables to the hospital.  [facility] is not responsible for any belongings or valuables.  Contacts, dentures or bridgework may not be worn into surgery.  Leave your suitcase in the car.  After surgery it may be brought to your room.  For patients admitted to the hospital, discharge time will be determined by your treatment team.  Special instructions:   - Preparing for Surgery  Before surgery, you can play an important role.  Because skin is not sterile, your skin needs to be as free of germs as possible.  You can reduce the number of germs on you skin by washing with CHG (chlorahexidine gluconate) soap before surgery.  CHG is an antiseptic cleaner which kills germs and bonds with the skin to continue killing germs even after washing.  Please DO NOT use if you have an allergy to CHG or antibacterial soaps.  If your skin becomes reddened/irritated stop using the CHG and inform your nurse when you arrive at Short Stay.  Do not shave (including legs and underarms) for at least 48 hours prior to  the first CHG shower.  You may shave your face.  Please follow these instructions carefully:   1.  Shower with CHG Soap the night before surgery and the                    morning of Surgery.  2.  If you choose to wash your hair, wash your hair first as usual with your       normal shampoo.  3.  After you shampoo, rinse your hair and body thoroughly to remove the shampoo.  4.  Use CHG as you would any other liquid soap.  You can apply chg directly       to the skin and wash gently with scrungie or a clean washcloth.  5.  Apply the CHG Soap to your body ONLY FROM THE NECK DOWN.        Do not use on open wounds or open sores.  Avoid contact with your eyes, ears, mouth and genitals (private parts).  Wash genitals (private parts) with your normal soap.  6.  Wash thoroughly, paying special attention to the area where your surgery        will be performed.  7.  Thoroughly rinse your body with warm water from the neck down.  8.  DO NOT shower/wash with your normal soap after using and rinsing off       the CHG Soap.  9.  Pat yourself dry with a clean towel.            10.  Wear clean pajamas.            11.  Place clean sheets on your bed the night of your first shower and do not        sleep with pets.  Day of Surgery  Do not apply any lotions the morning of surgery.  Please wear clean clothes to the hospital.   Please read over the following fact sheets that you were given.

## 2016-02-13 NOTE — Progress Notes (Signed)
Anesthesia Chart Review:  Pt is a 60 year old computer assisted R total knee arthroplasty on 02/23/2016 with Tammy Lanes, MD.   - PCP is Tammy Blitz, MD.   PMH includes:  TIA (pt denies), seizures (x1 2013, none since), anemia, thrombophlebitis, GERD. Never smoker. BMI 28.5. S/p C5-7 arthroplasty 08/22/15.   Medications include: abilify, prilosec  Preoperative labs reviewed.    EKG 09/22/15: Sinus rhythm. Borderline T abnormalities, anterior leads  Echo 09/24/09 [name], Tammy Sandoval, [place]): 1. Low normal resting LV systolic function with possibly mild septal hypokinesis.  2. Normal chamber sizes and aortic root. 3. Normal mitral, tricuspi, aortic, and pulmonic valves with trace MR, TR, PR. No aortic stenosis or insufficiency. Mild LVH.   Pt is awaiting clearance for surgery from dentist; scheduled for "deep cleaning" of teeth 02/20/16.   If no changes, I anticipate pt can proceed with surgery as scheduled.   Willeen Cass, FNP-BC [name]/Anesthesiology Phone: [phone] 02/13/2016 2:05 PM

## 2016-02-20 MED ORDER — TRANEXAMIC ACID 1000 MG/10ML IV SOLN
2000.0000 mg | INTRAVENOUS | Status: AC
  Administered 2016-02-23: 2000 mg via TOPICAL
  Filled 2016-02-20: qty 20

## 2016-02-22 MED ORDER — CEFAZOLIN SODIUM-DEXTROSE 2-4 GM/100ML-% IV SOLN
2.0000 g | INTRAVENOUS | Status: AC
  Administered 2016-02-23: 2 g via INTRAVENOUS
  Filled 2016-02-22: qty 100

## 2016-02-23 ENCOUNTER — Inpatient Hospital Stay (HOSPITAL_COMMUNITY): Payer: Medicare Other | Admitting: Anesthesiology

## 2016-02-23 ENCOUNTER — Encounter (HOSPITAL_COMMUNITY)
Admission: RE | Disposition: A | Discharge status: Home Health | Payer: Self-pay | Source: Ambulatory Visit | Attending: Orthopedic Surgery

## 2016-02-23 ENCOUNTER — Inpatient Hospital Stay (HOSPITAL_COMMUNITY): Payer: Medicare Other | Admitting: Emergency Medicine

## 2016-02-23 ENCOUNTER — Encounter (HOSPITAL_COMMUNITY): Payer: Self-pay | Admitting: Anesthesiology

## 2016-02-23 ENCOUNTER — Inpatient Hospital Stay (HOSPITAL_COMMUNITY)
Admission: RE | Admit: 2016-02-23 | Discharge: 2016-02-24 | DRG: 470 | Disposition: A | Discharge status: Home Health | Payer: Medicare Other | Source: Ambulatory Visit | Attending: Orthopedic Surgery | Admitting: Orthopedic Surgery

## 2016-02-23 ENCOUNTER — Inpatient Hospital Stay (HOSPITAL_COMMUNITY): Payer: Medicare Other

## 2016-02-23 DIAGNOSIS — Z91040 Latex allergy status: Secondary | ICD-10-CM | POA: Diagnosis not present

## 2016-02-23 DIAGNOSIS — M1711 Unilateral primary osteoarthritis, right knee: Principal | ICD-10-CM | POA: Diagnosis present

## 2016-02-23 DIAGNOSIS — K219 Gastro-esophageal reflux disease without esophagitis: Secondary | ICD-10-CM | POA: Diagnosis present

## 2016-02-23 DIAGNOSIS — F329 Major depressive disorder, single episode, unspecified: Secondary | ICD-10-CM | POA: Diagnosis present

## 2016-02-23 DIAGNOSIS — D649 Anemia, unspecified: Secondary | ICD-10-CM | POA: Diagnosis present

## 2016-02-23 DIAGNOSIS — K5900 Constipation, unspecified: Secondary | ICD-10-CM | POA: Diagnosis present

## 2016-02-23 DIAGNOSIS — M6281 Muscle weakness (generalized): Secondary | ICD-10-CM

## 2016-02-23 DIAGNOSIS — Z09 Encounter for follow-up examination after completed treatment for conditions other than malignant neoplasm: Secondary | ICD-10-CM

## 2016-02-23 DIAGNOSIS — R262 Difficulty in walking, not elsewhere classified: Secondary | ICD-10-CM

## 2016-02-23 DIAGNOSIS — M25561 Pain in right knee: Secondary | ICD-10-CM | POA: Diagnosis present

## 2016-02-23 DIAGNOSIS — Z85828 Personal history of other malignant neoplasm of skin: Secondary | ICD-10-CM

## 2016-02-23 DIAGNOSIS — Z888 Allergy status to other drugs, medicaments and biological substances status: Secondary | ICD-10-CM

## 2016-02-23 DIAGNOSIS — Z79899 Other long term (current) drug therapy: Secondary | ICD-10-CM

## 2016-02-23 HISTORY — PX: KNEE ARTHROPLASTY: SHX992

## 2016-02-23 SURGERY — ARTHROPLASTY, KNEE, TOTAL, USING IMAGELESS COMPUTER-ASSISTED NAVIGATION
Anesthesia: Monitor Anesthesia Care | Site: Knee | Laterality: Right

## 2016-02-23 MED ORDER — FENTANYL CITRATE (PF) 100 MCG/2ML IJ SOLN
INTRAMUSCULAR | Status: AC
  Filled 2016-02-23: qty 2

## 2016-02-23 MED ORDER — DIPHENHYDRAMINE HCL 12.5 MG/5ML PO ELIX: 12.5000 mg | ORAL_SOLUTION | ORAL | Status: DC | PRN

## 2016-02-23 MED ORDER — HYDROMORPHONE HCL 1 MG/ML IJ SOLN
INTRAMUSCULAR | Status: AC
  Filled 2016-02-23: qty 0.5

## 2016-02-23 MED ORDER — DEXAMETHASONE SODIUM PHOSPHATE 10 MG/ML IJ SOLN
10.0000 mg | Freq: Once | INTRAMUSCULAR | Status: AC
  Administered 2016-02-24: 10 mg via INTRAVENOUS
  Filled 2016-02-23: qty 1

## 2016-02-23 MED ORDER — PROPOFOL 10 MG/ML IV BOLUS
INTRAVENOUS | Status: AC
  Filled 2016-02-23: qty 20

## 2016-02-23 MED ORDER — ACETAMINOPHEN 10 MG/ML IV SOLN
INTRAVENOUS | Status: AC
  Filled 2016-02-23: qty 100

## 2016-02-23 MED ORDER — ACETAMINOPHEN 650 MG RE SUPP: 650.0000 mg | Freq: Four times a day (QID) | RECTAL | Status: DC | PRN

## 2016-02-23 MED ORDER — PANTOPRAZOLE SODIUM 40 MG PO TBEC
40.0000 mg | DELAYED_RELEASE_TABLET | Freq: Every day | ORAL | Status: DC
  Administered 2016-02-24: 40 mg via ORAL
  Filled 2016-02-23: qty 1

## 2016-02-23 MED ORDER — PHENYLEPHRINE HCL 10 MG/ML IJ SOLN
INTRAMUSCULAR | Status: DC | PRN
  Administered 2016-02-23: 80 ug via INTRAVENOUS

## 2016-02-23 MED ORDER — FENTANYL CITRATE (PF) 100 MCG/2ML IJ SOLN
INTRAMUSCULAR | Status: DC | PRN
  Administered 2016-02-23: 100 ug via INTRAVENOUS
  Administered 2016-02-23: 50 ug via INTRAVENOUS

## 2016-02-23 MED ORDER — LACTATED RINGERS IV SOLN
INTRAVENOUS | Status: DC
  Administered 2016-02-23 (×2): via INTRAVENOUS

## 2016-02-23 MED ORDER — POLYETHYLENE GLYCOL 3350 17 G PO PACK: 17.0000 g | PACK | Freq: Every day | ORAL | Status: DC | PRN

## 2016-02-23 MED ORDER — BUPIVACAINE HCL (PF) 0.5 % IJ SOLN
INTRAMUSCULAR | Status: DC | PRN
  Administered 2016-02-23: 30 mL

## 2016-02-23 MED ORDER — DEXTROSE 5 % IV SOLN
INTRAVENOUS | Status: DC | PRN
  Administered 2016-02-23: 25 ug/min via INTRAVENOUS

## 2016-02-23 MED ORDER — SODIUM CHLORIDE 0.9 % IV SOLN
INTRAVENOUS | Status: DC
  Administered 2016-02-23: 17:00:00 via INTRAVENOUS
  Administered 2016-02-24: 150 mL/h via INTRAVENOUS

## 2016-02-23 MED ORDER — OXYCODONE HCL ER 10 MG PO T12A
10.0000 mg | EXTENDED_RELEASE_TABLET | Freq: Two times a day (BID) | ORAL | Status: DC
  Administered 2016-02-23 – 2016-02-24 (×2): 10 mg via ORAL
  Filled 2016-02-23 (×2): qty 1

## 2016-02-23 MED ORDER — 0.9 % SODIUM CHLORIDE (POUR BTL) OPTIME
TOPICAL | Status: DC | PRN
  Administered 2016-02-23: 1000 mL

## 2016-02-23 MED ORDER — METOCLOPRAMIDE HCL 5 MG PO TABS: 5.0000 mg | ORAL_TABLET | Freq: Three times a day (TID) | ORAL | Status: DC | PRN

## 2016-02-23 MED ORDER — POVIDONE-IODINE 10 % EX SWAB: 2.0000 "application " | Freq: Once | CUTANEOUS | Status: DC

## 2016-02-23 MED ORDER — TRAZODONE HCL 100 MG PO TABS
300.0000 mg | ORAL_TABLET | Freq: Every day | ORAL | Status: DC
  Administered 2016-02-23: 300 mg via ORAL
  Filled 2016-02-23: qty 3

## 2016-02-23 MED ORDER — KETOROLAC TROMETHAMINE 30 MG/ML IJ SOLN
INTRAMUSCULAR | Status: AC
  Filled 2016-02-23: qty 1

## 2016-02-23 MED ORDER — HYDROMORPHONE HCL 1 MG/ML IJ SOLN
0.2500 mg | INTRAMUSCULAR | Status: DC | PRN
  Administered 2016-02-23: 0.5 mg via INTRAVENOUS

## 2016-02-23 MED ORDER — PHENYLEPHRINE 40 MCG/ML (10ML) SYRINGE FOR IV PUSH (FOR BLOOD PRESSURE SUPPORT)
PREFILLED_SYRINGE | INTRAVENOUS | Status: AC
  Filled 2016-02-23: qty 10

## 2016-02-23 MED ORDER — EPHEDRINE SULFATE 50 MG/ML IJ SOLN
INTRAMUSCULAR | Status: DC | PRN
  Administered 2016-02-23: 5 mg via INTRAVENOUS

## 2016-02-23 MED ORDER — PROPOFOL 500 MG/50ML IV EMUL
INTRAVENOUS | Status: DC | PRN
  Administered 2016-02-23: 75 ug/kg/min via INTRAVENOUS

## 2016-02-23 MED ORDER — METOCLOPRAMIDE HCL 5 MG/ML IJ SOLN: 5.0000 mg | Freq: Three times a day (TID) | INTRAMUSCULAR | Status: DC | PRN

## 2016-02-23 MED ORDER — LIDOCAINE HCL (CARDIAC) 20 MG/ML IV SOLN
INTRAVENOUS | Status: DC | PRN
  Administered 2016-02-23: 50 mg via INTRAVENOUS

## 2016-02-23 MED ORDER — OXYCODONE HCL 5 MG PO TABS
5.0000 mg | ORAL_TABLET | ORAL | Status: DC | PRN
  Administered 2016-02-23 – 2016-02-24 (×5): 10 mg via ORAL
  Filled 2016-02-23 (×4): qty 2

## 2016-02-23 MED ORDER — SENNA 8.6 MG PO TABS
2.0000 | ORAL_TABLET | Freq: Every day | ORAL | Status: DC
  Administered 2016-02-23: 17.2 mg via ORAL
  Filled 2016-02-23: qty 2

## 2016-02-23 MED ORDER — HYDROMORPHONE HCL 2 MG/ML IJ SOLN
0.5000 mg | INTRAMUSCULAR | Status: DC | PRN
  Administered 2016-02-23 – 2016-02-24 (×3): 1 mg via INTRAVENOUS
  Filled 2016-02-23 (×3): qty 1

## 2016-02-23 MED ORDER — DOCUSATE SODIUM 100 MG PO CAPS
100.0000 mg | ORAL_CAPSULE | Freq: Two times a day (BID) | ORAL | Status: DC
  Administered 2016-02-23 – 2016-02-24 (×2): 100 mg via ORAL
  Filled 2016-02-23 (×2): qty 1

## 2016-02-23 MED ORDER — DEXTROSE 5 % IV SOLN: 500.0000 mg | Freq: Four times a day (QID) | INTRAVENOUS | Status: DC | PRN

## 2016-02-23 MED ORDER — VENLAFAXINE HCL ER 150 MG PO CP24
150.0000 mg | ORAL_CAPSULE | Freq: Every day | ORAL | Status: DC
  Administered 2016-02-24: 150 mg via ORAL
  Filled 2016-02-23: qty 1

## 2016-02-23 MED ORDER — SODIUM CHLORIDE 0.9 % IJ SOLN
INTRAMUSCULAR | Status: DC | PRN
  Administered 2016-02-23: 30 mL

## 2016-02-23 MED ORDER — LINACLOTIDE 145 MCG PO CAPS
145.0000 ug | ORAL_CAPSULE | Freq: Every day | ORAL | Status: DC
  Administered 2016-02-24: 145 ug via ORAL
  Filled 2016-02-23: qty 1

## 2016-02-23 MED ORDER — KETOROLAC TROMETHAMINE 15 MG/ML IJ SOLN
15.0000 mg | Freq: Four times a day (QID) | INTRAMUSCULAR | Status: AC
  Administered 2016-02-23 – 2016-02-24 (×4): 15 mg via INTRAVENOUS
  Filled 2016-02-23 (×4): qty 1

## 2016-02-23 MED ORDER — LIDOCAINE 2% (20 MG/ML) 5 ML SYRINGE
INTRAMUSCULAR | Status: AC
Start: 2016-02-23 — End: 2016-02-23
  Filled 2016-02-23: qty 5

## 2016-02-23 MED ORDER — SODIUM CHLORIDE 0.9 % IR SOLN
Status: DC | PRN
  Administered 2016-02-23: 1000 mL

## 2016-02-23 MED ORDER — ONDANSETRON HCL 4 MG PO TABS: 4.0000 mg | ORAL_TABLET | Freq: Four times a day (QID) | ORAL | Status: DC | PRN

## 2016-02-23 MED ORDER — GABAPENTIN 600 MG PO TABS
600.0000 mg | ORAL_TABLET | Freq: Three times a day (TID) | ORAL | Status: DC
  Administered 2016-02-23 – 2016-02-24 (×3): 600 mg via ORAL
  Filled 2016-02-23 (×3): qty 1

## 2016-02-23 MED ORDER — MIDAZOLAM HCL 5 MG/5ML IJ SOLN
INTRAMUSCULAR | Status: DC | PRN
  Administered 2016-02-23: 2 mg via INTRAVENOUS

## 2016-02-23 MED ORDER — PHENOL 1.4 % MT LIQD: 1.0000 | OROMUCOSAL | Status: DC | PRN

## 2016-02-23 MED ORDER — APIXABAN 2.5 MG PO TABS
2.5000 mg | ORAL_TABLET | Freq: Two times a day (BID) | ORAL | Status: DC
  Administered 2016-02-24: 2.5 mg via ORAL
  Filled 2016-02-23: qty 1

## 2016-02-23 MED ORDER — METHOCARBAMOL 500 MG PO TABS
500.0000 mg | ORAL_TABLET | Freq: Four times a day (QID) | ORAL | Status: DC | PRN
  Administered 2016-02-23 – 2016-02-24 (×3): 500 mg via ORAL
  Filled 2016-02-23 (×3): qty 1

## 2016-02-23 MED ORDER — CEFAZOLIN IN D5W 1 GM/50ML IV SOLN
1.0000 g | Freq: Four times a day (QID) | INTRAVENOUS | Status: AC
  Administered 2016-02-23 – 2016-02-24 (×2): 1 g via INTRAVENOUS
  Filled 2016-02-23 (×2): qty 50

## 2016-02-23 MED ORDER — ARIPIPRAZOLE 5 MG PO TABS
5.0000 mg | ORAL_TABLET | Freq: Every day | ORAL | Status: DC
  Administered 2016-02-24: 5 mg via ORAL
  Filled 2016-02-23: qty 1

## 2016-02-23 MED ORDER — BUPIVACAINE HCL (PF) 0.5 % IJ SOLN
INTRAMUSCULAR | Status: AC
  Filled 2016-02-23: qty 30

## 2016-02-23 MED ORDER — ACETAMINOPHEN 10 MG/ML IV SOLN
1000.0000 mg | INTRAVENOUS | Status: AC
  Administered 2016-02-23: 1000 mg via INTRAVENOUS

## 2016-02-23 MED ORDER — BUPIVACAINE IN DEXTROSE 0.75-8.25 % IT SOLN
INTRATHECAL | Status: DC | PRN
  Administered 2016-02-23: 15 mg via INTRATHECAL

## 2016-02-23 MED ORDER — OXYCODONE HCL 5 MG PO TABS
ORAL_TABLET | ORAL | Status: AC
  Filled 2016-02-23: qty 2

## 2016-02-23 MED ORDER — SODIUM CHLORIDE 0.9 % IR SOLN
Status: DC | PRN
  Administered 2016-02-23: 3000 mL

## 2016-02-23 MED ORDER — SODIUM CHLORIDE 0.9 % IV SOLN: INTRAVENOUS | Status: DC

## 2016-02-23 MED ORDER — ALUM & MAG HYDROXIDE-SIMETH 200-200-20 MG/5ML PO SUSP
30.0000 mL | ORAL | Status: DC | PRN
  Administered 2016-02-24: 30 mL via ORAL
  Filled 2016-02-23: qty 30

## 2016-02-23 MED ORDER — CHLORHEXIDINE GLUCONATE 4 % EX LIQD: 60.0000 mL | Freq: Once | CUTANEOUS | Status: DC

## 2016-02-23 MED ORDER — MIDAZOLAM HCL 2 MG/2ML IJ SOLN
INTRAMUSCULAR | Status: AC
  Filled 2016-02-23: qty 2

## 2016-02-23 MED ORDER — KETOROLAC TROMETHAMINE 30 MG/ML IJ SOLN
INTRAMUSCULAR | Status: DC | PRN
  Administered 2016-02-23: 30 mg via INTRA_ARTICULAR

## 2016-02-23 MED ORDER — ONDANSETRON HCL 4 MG/2ML IJ SOLN: 4.0000 mg | Freq: Four times a day (QID) | INTRAMUSCULAR | Status: DC | PRN

## 2016-02-23 MED ORDER — ACETAMINOPHEN 325 MG PO TABS: 650.0000 mg | ORAL_TABLET | Freq: Four times a day (QID) | ORAL | Status: DC | PRN

## 2016-02-23 MED ORDER — MENTHOL 3 MG MT LOZG: 1.0000 | LOZENGE | OROMUCOSAL | Status: DC | PRN

## 2016-02-23 SURGICAL SUPPLY — 52 items
ALCOHOL ISOPROPYL (RUBBING) (MISCELLANEOUS) ×2 IMPLANT
BANDAGE ACE 6X5 VEL STRL LF (GAUZE/BANDAGES/DRESSINGS) ×2 IMPLANT
BLADE SAW RECIP 87.9 MT (BLADE) ×2 IMPLANT
BLADE SURG 10 STRL SS (BLADE) ×2 IMPLANT
BNDG ELASTIC 6X10 VLCR STRL LF (GAUZE/BANDAGES/DRESSINGS) ×2 IMPLANT
CAPT KNEE TRIATH TK-4 ×2 IMPLANT
CHLORAPREP W/TINT 26ML (MISCELLANEOUS) ×4 IMPLANT
CUFF TOURNIQUET SINGLE 34IN LL (TOURNIQUET CUFF) ×2 IMPLANT
DERMABOND ADVANCED (GAUZE/BANDAGES/DRESSINGS) ×2
DERMABOND ADVANCED .7 DNX12 (GAUZE/BANDAGES/DRESSINGS) ×2 IMPLANT
DRAIN HEMOVAC 7FR (DRAIN) ×2 IMPLANT
DRAPE SHEET LG 3/4 BI-LAMINATE (DRAPES) ×4 IMPLANT
DRAPE U-SHAPE 47X51 STRL (DRAPES) ×2 IMPLANT
DRAPE UNIVERSAL PACK (DRAPES) ×2 IMPLANT
DRSG AQUACEL AG ADV 3.5X14 (GAUZE/BANDAGES/DRESSINGS) ×2 IMPLANT
DRSG TEGADERM 2-3/8X2-3/4 SM (GAUZE/BANDAGES/DRESSINGS) ×2 IMPLANT
ELECT REM PT RETURN 9FT ADLT (ELECTROSURGICAL) ×2
ELECTRODE REM PT RTRN 9FT ADLT (ELECTROSURGICAL) ×1 IMPLANT
EVACUATOR 1/8 PVC DRAIN (DRAIN) IMPLANT
GLOVE BIO SURGEON STRL SZ8.5 (GLOVE) ×4 IMPLANT
GLOVE BIOGEL PI IND STRL 8 (GLOVE) ×1 IMPLANT
GLOVE BIOGEL PI IND STRL 8.5 (GLOVE) ×2 IMPLANT
GLOVE BIOGEL PI INDICATOR 8 (GLOVE) ×1
GLOVE BIOGEL PI INDICATOR 8.5 (GLOVE) ×2
GLOVE SS BIOGEL STRL SZ 7.5 (GLOVE) ×1 IMPLANT
GLOVE SS BIOGEL STRL SZ 8.5 (GLOVE) ×1 IMPLANT
GLOVE SUPERSENSE BIOGEL SZ 7.5 (GLOVE) ×1
GLOVE SUPERSENSE BIOGEL SZ 8.5 (GLOVE) ×1
GOWN STRL REUS W/TWL 2XL LVL3 (GOWN DISPOSABLE) ×2 IMPLANT
HANDPIECE INTERPULSE COAX TIP (DISPOSABLE) ×1
KIT BASIN OR (CUSTOM PROCEDURE TRAY) ×2 IMPLANT
MANIFOLD NEPTUNE II (INSTRUMENTS) ×2 IMPLANT
NEEDLE SPNL 18GX3.5 QUINCKE PK (NEEDLE) ×4 IMPLANT
PACK TOTAL JOINT (CUSTOM PROCEDURE TRAY) ×2 IMPLANT
PACK TOTAL KNEE CUSTOM (KITS) ×2 IMPLANT
PADDING CAST COTTON 6X4 STRL (CAST SUPPLIES) ×2 IMPLANT
SAW OSC TIP CART 19.5X105X1.3 (SAW) ×2 IMPLANT
SEALER BIPOLAR AQUA 6.0 (INSTRUMENTS) ×2 IMPLANT
SET HNDPC FAN SPRY TIP SCT (DISPOSABLE) ×1 IMPLANT
SET PAD KNEE POSITIONER (MISCELLANEOUS) ×2 IMPLANT
SUT MNCRL AB 3-0 PS2 27 (SUTURE) ×2 IMPLANT
SUT MON AB 2-0 CT1 36 (SUTURE) ×4 IMPLANT
SUT VIC AB 1 CT1 27 (SUTURE) ×1
SUT VIC AB 1 CT1 27XBRD ANBCTR (SUTURE) ×1 IMPLANT
SUT VIC AB 1 CTX 27 (SUTURE) ×2 IMPLANT
SUT VIC AB 2-0 CT1 27 (SUTURE) ×1
SUT VIC AB 2-0 CT1 TAPERPNT 27 (SUTURE) ×1 IMPLANT
SUT VLOC 180 0 24IN GS25 (SUTURE) ×2 IMPLANT
SYR 50ML LL SCALE MARK (SYRINGE) ×4 IMPLANT
TOWER CARTRIDGE SMART MIX (DISPOSABLE) ×2 IMPLANT
TRAY CATH 16FR W/PLASTIC CATH (SET/KITS/TRAYS/PACK) IMPLANT
WRAP KNEE MAXI GEL POST OP (GAUZE/BANDAGES/DRESSINGS) ×2 IMPLANT

## 2016-02-23 NOTE — Anesthesia Procedure Notes (Signed)
Procedure Name: MAC Date/Time: 02/23/2016 12:29 PM Performed by: Neldon Newport Pre-anesthesia Checklist: Timeout performed, Patient being monitored, Suction available, Emergency Drugs available and Patient identified Patient Re-evaluated:Patient Re-evaluated prior to inductionOxygen Delivery Method: Nasal cannula

## 2016-02-23 NOTE — Progress Notes (Signed)
Orthopedic Tech Progress Note Patient Details:  Tammy Sandoval Mar 01, 1956 [id number] Trapeze bar     [name] 02/23/2016, 4:39 PM

## 2016-02-23 NOTE — Interval H&P Note (Signed)
History and Physical Interval Note:  02/23/2016 11:32 AM  Tammy Sandoval  has presented today for surgery, with the diagnosis of DEGENERATIVE DISEASE RIGHT KNEE  The various methods of treatment have been discussed with the patient and family. After consideration of risks, benefits and other options for treatment, the patient has consented to  Procedure(s): COMPUTER ASSISTED TOTAL KNEE ARTHROPLASTY NAVIGATION (Right) as a surgical intervention .  The patient's history has been reviewed, patient examined, no change in status, stable for surgery.  I have reviewed the patient's chart and labs.  Questions were answered to the patient's satisfaction.     [place], Horald Pollen

## 2016-02-23 NOTE — Transfer of Care (Signed)
Immediate Anesthesia Transfer of Care Note  Patient: Tammy Sandoval  Procedure(s) Performed: Procedure(s): COMPUTER ASSISTED TOTAL KNEE ARTHROPLASTY NAVIGATION (Right)  Patient Location: PACU  Anesthesia Type:MAC and Spinal  Level of Consciousness: awake, alert  and oriented  Airway & Oxygen Therapy: Patient Spontanous Breathing and Patient connected to nasal cannula oxygen  Post-op Assessment: Report given to RN and Post -op Vital signs reviewed and stable  Post vital signs: Reviewed and stable  Last Vitals:  Vitals:   02/23/16 0936 02/23/16 1521  BP: (!) 128/47   Pulse: 80   Resp: 20   Temp: 36.8 C 36.2 C    Last Pain:  Vitals:   02/23/16 1036  TempSrc:   PainSc: 5       Patients Stated Pain Goal: 6 (58/44/17 1278)  Complications: No apparent anesthesia complications

## 2016-02-23 NOTE — H&P (View-Only) (Signed)
TOTAL KNEE ADMISSION H&P  Patient is being admitted for right total knee arthroplasty.  Subjective:  Chief Complaint:right knee pain.  HPI: Tammy Sandoval, 60 y.o. female, has a history of pain and functional disability in the right knee due to arthritis and has failed non-surgical conservative treatments for greater than 12 weeks to includeNSAID's and/or analgesics, corticosteriod injections, flexibility and strengthening excercises, use of assistive devices, weight reduction as appropriate and activity modification.  Onset of symptoms was gradual, starting 10 years ago with gradually worsening course since that time. The patient noted prior procedures on the knee to include  arthroscopy on the right knee(s).  Patient currently rates pain in the right knee(s) at 10 out of 10 with activity. Patient has night pain, worsening of pain with activity and weight bearing, pain that interferes with activities of daily living, pain with passive range of motion, crepitus and joint swelling.  Patient has evidence of subchondral cysts, subchondral sclerosis, periarticular osteophytes and joint space narrowing by imaging studies. There is no active infection.  Patient Active Problem List   Diagnosis Date Noted  . Major depression 09/23/2015  . Osteoarthritis of spine with radiculopathy, cervical region 08/22/2015  . Varicose veins of lower extremities with other complications 69/48/5462  . Chronic venous insufficiency 08/13/2011   Past Medical History:  Diagnosis Date  . Anemia   . Cancer [facility])    skin-surgically removed  . Crescendo transient ischemic attacks   . Depression   . Generalized headaches   . GERD (gastroesophageal reflux disease)   . Gestational diabetes   . Insomnia   . Irritable bowel syndrome   . Leg pain   . Low back pain   . Obesity   . Palpitations   . Restless leg   . Seizures ([place]) 2013   only one she has ever had, placed on neurontin  . Thrombophlebitis   . Ulcer ([place])     RLE  . Varicose veins     Past Surgical History:  Procedure Laterality Date  . APPENDECTOMY    . BREAST LUMPECTOMY     bilateral  . CERVICAL DISC ARTHROPLASTY N/A 08/22/2015   Procedure: Cervical five - six Cervical six - seven arthroplasty;  Surgeon: Ashok Pall, MD;  Location: [place] NEURO ORS;  Service: Neurosurgery;  Laterality: N/A;  C56 C67 arthroplasty  . CESAREAN SECTION    . LEG SURGERY  2004   mass removed on right leg  . skin cancer removal  2012 & 2013   2 places on face     (Not in a hospital admission) Allergies  Allergen Reactions  . Latex Swelling    Redness,swelling  . Wellbutrin [Bupropion Hcl] Itching and Other (See Comments)    Made head itch and tingle. Also made patient loopy.    Social History  Substance Use Topics  . Smoking status: Never Smoker  . Smokeless tobacco: Never Used  . Alcohol use No    Family History  Problem Relation Age of Onset  . Cancer Mother     pancreatic and breast  . Deep vein thrombosis Mother   . Diabetes Mother   . Heart disease Mother   . Other Mother     varicose veing  . Heart disease Father   . Hyperlipidemia Father   . Hypertension Father   . Heart attack Father   . Diabetes Brother   . Heart attack Brother      Review of Systems  Constitutional: Positive for malaise/fatigue.  HENT:  Negative.   Eyes: Positive for blurred vision.  Respiratory: Positive for shortness of breath.   Gastrointestinal: Positive for constipation.  Genitourinary: Negative.   Musculoskeletal: Positive for back pain and joint pain.  Skin: Negative.   Neurological: Negative.   Endo/Heme/Allergies: Negative.   Psychiatric/Behavioral: Positive for depression. The patient is nervous/anxious.     Objective:  Physical Exam  Vitals reviewed. Constitutional: She is oriented to person, place, and time. She appears well-developed and well-nourished.  HENT:  Head: Normocephalic and atraumatic.  Eyes: Conjunctivae and EOM are normal.  Pupils are equal, round, and reactive to light.  Neck: Normal range of motion. Neck supple.  Cardiovascular: Normal rate, regular rhythm and intact distal pulses.   Respiratory: Effort normal. No respiratory distress.  GI: Soft. She exhibits no distension.  Genitourinary:  Genitourinary Comments: deferred  Musculoskeletal:       Right knee: She exhibits decreased range of motion, swelling, effusion and abnormal alignment. Tenderness found. Medial joint line and lateral joint line tenderness noted.  10-115  Neurological: She is alert and oriented to person, place, and time. She has normal reflexes.  Skin: Skin is warm and dry.  Psychiatric: She has a normal mood and affect. Her behavior is normal. Judgment and thought content normal.    Vital signs in last 24 hours: @VSRANGES @  Labs:   Estimated body mass index is 25.33 kg/m as calculated from the following:   Height as of 09/23/15: 5' 3"  (1.6 m).   Weight as of 09/23/15: 64.9 kg (143 lb).   Imaging Review Plain radiographs demonstrate severe degenerative joint disease of the right knee(s). The overall alignment issignificant varus. The bone quality appears to be adequate for age and reported activity level.  Assessment/Plan:  End stage arthritis, right knee   The patient history, physical examination, clinical judgment of the provider and imaging studies are consistent with end stage degenerative joint disease of the right knee(s) and total knee arthroplasty is deemed medically necessary. The treatment options including medical management, injection therapy arthroscopy and arthroplasty were discussed at length. The risks and benefits of total knee arthroplasty were presented and reviewed. The risks due to aseptic loosening, infection, stiffness, patella tracking problems, thromboembolic complications and other imponderables were discussed. The patient acknowledged the explanation, agreed to proceed with the plan and consent was signed.  Patient is being admitted for inpatient treatment for surgery, pain control, PT, OT, prophylactic antibiotics, VTE prophylaxis, progressive ambulation and ADL's and discharge planning. The patient is planning to be discharged home with home health services. (+) TXA, ASA

## 2016-02-23 NOTE — Anesthesia Procedure Notes (Signed)
Spinal  Patient location during procedure: OR Start time: 02/23/2016 12:19 PM End time: 02/23/2016 12:23 PM Staffing Anesthesiologist: Roderic Palau Performed: anesthesiologist  Preanesthetic Checklist Completed: patient identified, surgical consent, pre-op evaluation, timeout performed, IV checked, risks and benefits discussed and monitors and equipment checked Spinal Block Patient position: sitting Prep: DuraPrep Patient monitoring: cardiac monitor, continuous pulse ox and blood pressure Approach: midline Location: L3-4 Injection technique: single-shot Needle Needle type: Pencan  Needle gauge: 24 G Needle length: 9 cm Assessment Sensory level: T8 Additional Notes Functioning IV was confirmed and monitors were applied. Sterile prep and drape, including hand hygiene and sterile gloves were used. The patient was positioned and the spine was prepped. The skin was anesthetized with lidocaine.  Free flow of clear CSF was obtained prior to injecting local anesthetic into the CSF.  The spinal needle aspirated freely following injection.  The needle was carefully withdrawn.  The patient tolerated the procedure well.

## 2016-02-23 NOTE — Anesthesia Postprocedure Evaluation (Signed)
Anesthesia Post Note  Patient: Tammy Sandoval  Procedure(s) Performed: Procedure(s) (LRB): COMPUTER ASSISTED TOTAL KNEE ARTHROPLASTY NAVIGATION (Right)  Patient location during evaluation: PACU Anesthesia Type: General Level of consciousness: awake, awake and alert and oriented Pain management: pain level controlled Vital Signs Assessment: post-procedure vital signs reviewed and stable Respiratory status: spontaneous breathing, nonlabored ventilation and respiratory function stable Cardiovascular status: blood pressure returned to baseline Anesthetic complications: no    Last Vitals:  Vitals:   02/23/16 1610 02/23/16 1627  BP: 125/63 115/90  Pulse: 65 63  Resp: 11   Temp:  36.6 C    Last Pain:  Vitals:   02/23/16 1651  TempSrc:   PainSc: 5                  JOSLIN,DAVID COKER

## 2016-02-23 NOTE — Op Note (Signed)
OPERATIVE REPORT  SURGEON: Rod Can, MD   ASSISTANT: Ky Barban, RNFA.  PREOPERATIVE DIAGNOSIS: Right knee arthritis.   POSTOPERATIVE DIAGNOSIS: Right knee arthritis.   PROCEDURE: Right total knee arthroplasty.   IMPLANTS: Stryker Triathlon CR femur, size 4. Stryker Tritanium tibia, size 4. X3 polyethelyene insert, size 9 mm, CR. 3 button asymmetric patella, size 32 mm.  ANESTHESIA:  Spinal  TOURNIQUET TIME: Not utilized.   ESTIMATED BLOOD LOSS: 250 mL.  ANTIBIOTICS: 2 g Ancef.  DRAINS: None.  COMPLICATIONS: None   CONDITION: PACU - hemodynamically stable.   BRIEF CLINICAL NOTE: Tammy Sandoval is a 60 y.o. female with a long-standing history of Right knee arthritis. After failing conservative management, the patient was indicated for total knee arthroplasty. The risks, benefits, and alternatives to the procedure were explained, and the patient elected to proceed.  PROCEDURE IN DETAIL: Spinal anesthesia was obtained in the pre-op holding area. Once inside the operative room, a foley catheter was inserted. The patient was then positioned, a nonsterile tourniquet was placed, and the lower extremity was prepped and draped in the normal sterile surgical fashion. A time-out was called verifying side and site of surgery. The patient received IV antibiotics within 60 minutes of beginning the procedure. The tourniquet was not utilized.  An anterior approach to the knee was performed utilizing a midvastus arthrotomy. A medial release was performed and the patellar fat pad was excised. Stryker navigation was used to cut the distal femur perpendicular to the mechanical axis. A freehand patellar resection was performed, and the patella was sized an prepared with 3 lug holes.  Nagivation was used to make a neutral proximal tibia resection, taking 8 mm of bone from the less affected  lateral side with 3 degrees of slope. The menisci were excised. A spacer block was placed, and the alignment and balance in extension were confirmed.   The distal femur was sized using the 3-degree external rotation guide referencing the posterior femoral cortex. The appropriate 4-in-1 cutting block was pinned into place. Rotation was checked using Whiteside's line, the epicondylar axis, and then confirmed with a spacer block in flexion. The remaining femoral cuts were performed, taking care to protect the MCL.  The tibia was sized and the trial tray was pinned into place. The remaining trail components were inserted. The knee was stable to varus and valgus stress through a full range of motion. The patella tracked centrally, and the PCL was well balanced. The trial components were removed, and the proximal tibial surface was prepared. Final components were impacted into place. The knee was tested for a final time and found to be well balanced.  The wound was copiously irrigated with a dilute betadine solution followed by normal saline with pulse lavage. Marcaine solution was injected into the periarticular soft tissue. The wound was closed in layers using #1 Vicryl and V-Loc for the fascia, 2-0 Vicryl for the subcutaneous fat, 2-0 Monocryl for the deep dermal layer, 3-0 running Monocryl subcuticular Stitch, and Dermabond for the skin. Once the glue was fully dried, an Aquacell Ag and compressive dressing were applied. Tthe patient was transported to the recovery room in stable condition. Sponge, needle, and instrument counts were correct at the end of the case x2. The patient tolerated the procedure well and there were no known complications.

## 2016-02-23 NOTE — Anesthesia Preprocedure Evaluation (Addendum)
Anesthesia Evaluation  Patient identified by MRN, date of birth, ID band Patient awake    Reviewed: Allergy & Precautions, H&P , NPO status , Patient's Chart, lab work & pertinent test results  Airway Mallampati: II  TM Distance: >3 FB Neck ROM: Full    Dental no notable dental hx. (+) Teeth Intact, Dental Advisory Given   Pulmonary neg pulmonary ROS,    Pulmonary exam normal breath sounds clear to auscultation       Cardiovascular negative cardio ROS   Rhythm:Regular Rate:Normal     Neuro/Psych  Headaches, Seizures -, Well Controlled,  Anxiety Depression    GI/Hepatic Neg liver ROS, GERD  Medicated and Controlled,  Endo/Other  negative endocrine ROSdiabetes  Renal/GU negative Renal ROS  negative genitourinary   Musculoskeletal  (+) Arthritis , Osteoarthritis,    Abdominal   Peds  Hematology negative hematology ROS (+) anemia ,   Anesthesia Other Findings   Reproductive/Obstetrics negative OB ROS                            Anesthesia Physical Anesthesia Plan  ASA: II  Anesthesia Plan: MAC and Spinal   Post-op Pain Management:    Induction: Intravenous  Airway Management Planned: Simple Face Mask  Additional Equipment:   Intra-op Plan:   Post-operative Plan:   Informed Consent: I have reviewed the patients History and Physical, chart, labs and discussed the procedure including the risks, benefits and alternatives for the proposed anesthesia with the patient or authorized representative who has indicated his/her understanding and acceptance.   Dental advisory given  Plan Discussed with: CRNA  Anesthesia Plan Comments:         Anesthesia Quick Evaluation

## 2016-02-24 ENCOUNTER — Encounter (HOSPITAL_COMMUNITY): Payer: Self-pay | Admitting: General Practice

## 2016-02-24 LAB — BASIC METABOLIC PANEL
Anion gap: 3 — ABNORMAL LOW (ref 5–15)
BUN: 15 mg/dL (ref 6–20)
CO2: 27 mmol/L (ref 22–32)
Calcium: 7.8 mg/dL — ABNORMAL LOW (ref 8.9–10.3)
Chloride: 108 mmol/L (ref 101–111)
Creatinine, Ser: 0.74 mg/dL (ref 0.44–1.00)
GFR calc Af Amer: >60 mL/min (ref >60)
GFR calc non Af Amer: >60 mL/min (ref >60)
Glucose, Bld: 135 mg/dL — ABNORMAL HIGH (ref 65–99)
Potassium: 4.2 mmol/L (ref 3.5–5.1)
Sodium: 138 mmol/L (ref 135–145)

## 2016-02-24 LAB — CBC
HCT: 29.5 % — ABNORMAL LOW (ref 36.0–46.0)
Hemoglobin: 9.5 g/dL — ABNORMAL LOW (ref 12.0–15.0)
MCH: 29.5 pg (ref 26.0–34.0)
MCHC: 32.2 g/dL (ref 30.0–36.0)
MCV: 91.6 fL (ref 78.0–100.0)
Platelets: 252 10*3/uL (ref 150–400)
RBC: 3.22 MIL/uL — ABNORMAL LOW (ref 3.87–5.11)
RDW: 14.2 % (ref 11.5–15.5)
WBC: 6.9 10*3/uL (ref 4.0–10.5)

## 2016-02-24 MED ORDER — SENNA 8.6 MG PO TABS: 2.0000 | ORAL_TABLET | Freq: Every day | ORAL | 0 refills | Status: DC

## 2016-02-24 MED ORDER — APIXABAN 2.5 MG PO TABS: 2.5000 mg | ORAL_TABLET | Freq: Two times a day (BID) | ORAL | 0 refills | Status: DC

## 2016-02-24 MED ORDER — DOCUSATE SODIUM 100 MG PO CAPS
100.0000 mg | ORAL_CAPSULE | Freq: Two times a day (BID) | ORAL | 0 refills | Status: DC
Start: 2016-02-24 — End: 2017-10-28

## 2016-02-24 MED ORDER — ONDANSETRON HCL 4 MG PO TABS: 4.0000 mg | ORAL_TABLET | Freq: Four times a day (QID) | ORAL | 0 refills | Status: DC | PRN

## 2016-02-24 MED ORDER — OXYCODONE HCL 5 MG PO TABS: 5.0000 mg | ORAL_TABLET | ORAL | 0 refills | Status: DC | PRN

## 2016-02-24 NOTE — Evaluation (Signed)
Physical Therapy Evaluation Patient Details Name: Tammy Sandoval MRN: [id number] DOB: 24-Apr-1955 Today's Date: 02/24/2016   History of Present Illness  Pt admitted for R TKR via Dr Lyla Glassing on 02/23/16.  Clinical Impression  Pt presents with decreased strength, ROM and impaired gait and mobility. Pt will continue to benefit from skilled PT to address deficits and improve functional mobility for safe d/c home.    Follow Up Recommendations Outpatient PT    Equipment Recommendations  None recommended by PT    Recommendations for Other Services       Precautions / Restrictions Precautions Precautions: Fall Restrictions Weight Bearing Restrictions: No RLE Weight Bearing: Weight bearing as tolerated      Mobility  Bed Mobility Overal bed mobility: Needs Assistance Bed Mobility: Supine to Sit     Supine to sit: Supervision        Transfers Overall transfer level: Needs assistance Equipment used: Rolling walker (2 wheeled) Transfers: Sit to/from Stand Sit to Stand: Supervision         General transfer comment: cues for UE placement  Ambulation/Gait Ambulation/Gait assistance: Supervision Ambulation Distance (Feet): 25 Feet Assistive device: Rolling walker (2 wheeled)       General Gait Details: decreased cadence, cues for step through gait pattern, no LOB  Stairs            Wheelchair Mobility    Modified Rankin (Stroke Patients Only)       Balance     Sitting balance-Leahy Scale: Good     Standing balance support: Bilateral upper extremity supported Standing balance-Leahy Scale: Fair Standing balance comment: Pt able to stand at the sink to complete grooming tasks without UE support.                              Pertinent Vitals/Pain Pain Assessment: 0-10 Pain Score: 5  Pain Location: 8 Pain Descriptors / Indicators: Grimacing;Discomfort Pain Intervention(s): Limited activity within patient's tolerance;Monitored during  session;Premedicated before session    Home Living Family/patient expects to be discharged to:: Private residence Living Arrangements: Children;Spouse/significant other   Type of Home: House Home Access: Stairs to enter Entrance Stairs-Rails: Can reach both Entrance Stairs-Number of Steps: 3 Home Layout: One level;Able to live on main level with bedroom/bathroom (one step down into den) Home Equipment: Bedside commode;Walker - 2 wheels      Prior Function Level of Independence: Independent               Hand Dominance   Dominant Hand: Right    Extremity/Trunk Assessment   Upper Extremity Assessment: Overall WFL for tasks assessed           Lower Extremity Assessment: RLE deficits/detail RLE Deficits / Details: grossly 3-/5    Cervical / Trunk Assessment: Normal  Communication   Communication: No difficulties  Cognition Arousal/Alertness: Awake/alert Behavior During Therapy: WFL for tasks assessed/performed Overall Cognitive Status: Within Functional Limits for tasks assessed                      General Comments      Exercises Total Joint Exercises Ankle Circles/Pumps: AROM;20 reps;Both Quad Sets: AROM;10 reps;Right Short Arc Quad: AROM;10 reps;Right Heel Slides: AROM;Right;10 reps Hip ABduction/ADduction: AROM;Right;10 reps   Assessment/Plan    PT Assessment Patient needs continued PT services  PT Problem List Decreased range of motion;Decreased strength;Decreased mobility;Decreased activity tolerance;Pain          PT  Treatment Interventions DME instruction;Gait training;Stair training;Functional mobility training;Neuromuscular re-education;Manual techniques;Balance training;Therapeutic exercise;Patient/family education;Therapeutic activities;Modalities    PT Goals (Current goals can be found in the Care Plan section)  Acute Rehab PT Goals Patient Stated Goal: go home soon PT Goal Formulation: With patient Time For Goal Achievement:  03/02/16 Potential to Achieve Goals: Good    Frequency 7X/week   Barriers to discharge        Co-evaluation               End of Session   Activity Tolerance: Patient tolerated treatment well Patient left: in chair;with call bell/phone within reach Nurse Communication: Mobility status         Time: 8832-5498 PT Time Calculation (min) (ACUTE ONLY): 30 min   Charges:   PT Evaluation $PT Eval Low Complexity: 1 Procedure PT Treatments $Therapeutic Exercise: 8-22 mins   PT G Codes:        DONAWERTH,KAREN Mar 10, 2016, 10:41 AM

## 2016-02-24 NOTE — Discharge Instructions (Signed)
Dr. Rod Can [facility] [address] [place], [place] [address] [phone]  TOTAL KNEE REPLACEMENT POSTOPERATIVE DIRECTIONS    Knee Rehabilitation, Guidelines Following Surgery  Results after knee surgery are often greatly improved when you follow the exercise, range of motion and muscle strengthening exercises prescribed by your doctor. Safety measures are also important to protect the knee from further injury. Any time any of these exercises cause you to have increased pain or swelling in your knee joint, decrease the amount until you are comfortable again and slowly increase them. If you have problems or questions, call your caregiver or physical therapist for advice.   WEIGHT BEARING Weight bearing as tolerated with assist device (walker, cane, etc) as directed, use it as long as suggested by your surgeon or therapist, typically at least 4-6 weeks.  HOME CARE INSTRUCTIONS  Remove items at home which could result in a fall. This includes throw rugs or furniture in walking pathways.  Continue medications as instructed at time of discharge. You may have some home medications which will be placed on hold until you complete the course of blood thinner medication.  You may start showering once you are discharged home but do not submerge the incision under water. Just pat the incision dry and apply a dry gauze dressing on daily. Walk with walker as instructed.  You may resume a sexual relationship in one month or when given the OK by your doctor.   Use walker as long as suggested by your caregivers.  Avoid periods of inactivity such as sitting longer than an hour when not asleep. This helps prevent blood clots.  You may put full weight on your legs and walk as much as is comfortable.  You may return to work once you are cleared by your doctor.  Do not drive a car for 6 weeks or until released by you surgeon.   Do not drive while  taking narcotics.  Wear the elastic stockings for three weeks following surgery during the day but you may remove then at night. Make sure you keep all of your appointments after your operation with all of your doctors and caregivers. You should call the office at the above phone number and make an appointment for approximately two weeks after the date of your surgery. Do not remove your surgical dressing. The dressing is waterproof; you may take showers in 3 days, but do not take tub baths or submerge the dressing. Please pick up a stool softener and laxative for home use as long as you are requiring pain medications.  ICE to the affected knee every three hours for 30 minutes at a time and then as needed for pain and swelling.  Continue to use ice on the knee for pain and swelling from surgery. You may notice swelling that will progress down to the foot and ankle.  This is normal after surgery.  Elevate the leg when you are not up walking on it.   It is important for you to complete the blood thinner medication as prescribed by your doctor.  Continue to use the breathing machine which will help keep your temperature down.  It is common for your temperature to cycle up and down following surgery, especially at night when you are not up moving around and exerting yourself.  The breathing machine keeps your lungs expanded and your temperature down.  RANGE OF MOTION AND STRENGTHENING EXERCISES  Rehabilitation of the knee is important following a  knee injury or an operation. After just a few days of immobilization, the muscles of the thigh which control the knee become weakened and shrink (atrophy). Knee exercises are designed to build up the tone and strength of the thigh muscles and to improve knee motion. Often times heat used for twenty to thirty minutes before working out will loosen up your tissues and help with improving the range of motion but do not use heat for the first two weeks following  surgery. These exercises can be done on a training (exercise) mat, on the floor, on a table or on a bed. Use what ever works the best and is most comfortable for you Knee exercises include:  Leg Lifts - While your knee is still immobilized in a splint or cast, you can do straight leg raises. Lift the leg to 60 degrees, hold for 3 sec, and slowly lower the leg. Repeat 10-20 times 2-3 times daily. Perform this exercise against resistance later as your knee gets better.  Quad and Hamstring Sets - Tighten up the muscle on the front of the thigh (Quad) and hold for 5-10 sec. Repeat this 10-20 times hourly. Hamstring sets are done by pushing the foot backward against an object and holding for 5-10 sec. Repeat as with quad sets.  A rehabilitation program following serious knee injuries can speed recovery and prevent re-injury in the future due to weakened muscles. Contact your doctor or a physical therapist for more information on knee rehabilitation.   SKILLED REHAB INSTRUCTIONS: If the patient is transferred to a skilled rehab facility following release from the hospital, a list of the current medications will be sent to the facility for the patient to continue.  When discharged from the skilled rehab facility, please have the facility set up the patient's [place] prior to being released. Also, the skilled facility will be responsible for providing the patient with their medications at time of release from the facility to include their pain medication, the muscle relaxants, and their blood thinner medication. If the patient is still at the rehab facility at time of the two week follow up appointment, the skilled rehab facility will also need to assist the patient in arranging follow up appointment in our office and any transportation needs.  MAKE SURE YOU:  Understand these instructions.  Will watch your condition.  Will get help right away if you are not doing well or get worse.     Pick up stool softner and laxative for home use following surgery while on pain medications. Do NOT remove your dressing. You may shower.  Do not take tub baths or submerge incision under water. May shower starting three days after surgery. Please use a clean towel to pat the incision dry following showers. Continue to use ice for pain and swelling after surgery. Do not use any lotions or creams on the incision until instructed by your surgeon. ____________  Information on my medicine - ELIQUIS (apixaban)  This medication education was reviewed with me or my healthcare representative as part of my discharge preparation.  Why was Eliquis prescribed for you? Eliquis was prescribed for you to reduce the risk of blood clots forming after orthopedic surgery.    What do You need to know about Eliquis? Take your Eliquis TWICE DAILY - one tablet in the morning and one tablet in the evening with or without food.  It would be best to take the dose about the same time each day.  If  you have difficulty swallowing the tablet whole please discuss with your pharmacist how to take the medication safely.  Take Eliquis exactly as prescribed by your doctor and DO NOT stop taking Eliquis without talking to the doctor who prescribed the medication.  Stopping without other medication to take the place of Eliquis may increase your risk of developing a clot.  After discharge, you should have regular check-up appointments with your healthcare provider that is prescribing your Eliquis.  What do you do if you miss a dose? If a dose of ELIQUIS is not taken at the scheduled time, take it as soon as possible on the same day and twice-daily administration should be resumed.  The dose should not be doubled to make up for a missed dose.  Do not take more than one tablet of ELIQUIS at the same time.  Important Safety Information A possible side effect of Eliquis is bleeding. You should call your  healthcare provider right away if you experience any of the following: ? Bleeding from an injury or your nose that does not stop. ? Unusual colored urine (red or dark brown) or unusual colored stools (red or black). ? Unusual bruising for unknown reasons. ? A serious fall or if you hit your head (even if there is no bleeding).  Some medicines may interact with Eliquis and might increase your risk of bleeding or clotting while on Eliquis. To help avoid this, consult your healthcare provider or pharmacist prior to using any new prescription or non-prescription medications, including herbals, vitamins, non-steroidal anti-inflammatory drugs (NSAIDs) and supplements.  This website has more information on Eliquis (apixaban): http://www.eliquis.com/eliquis/home

## 2016-02-24 NOTE — Progress Notes (Signed)
Discharge papers and prescriptions reviewed with patient. Patient stated understanding. IV removed. Patient has friend at bedside for transportation Product manager, Therapist, sports

## 2016-02-24 NOTE — Care Management Note (Signed)
Case Management Note  Patient Details  Name: Tammy Sandoval MRN: [id number] Date of Birth: 02/19/1956  Subjective/Objective:   60 yr old female s/p right total knee arthroplasty.                  Action/Plan: Case manager spoke with patient concerning discharge plan. Patient states that Dr. Lyla Glassing said she will be going to outpatient therapy starting on Friday, 02/27/16. Patient has RW and 3in1.   Expected Discharge Date:    02/24/16              Expected Discharge Plan:  [place]  In-House Referral:  NA  Discharge planning Services  CM Consult  Post Acute Care Choice:  NA Choice offered to:  Patient  DME Arranged:    DME Agency:  NA  HH Arranged:  NA HH Agency:  NA  Status of Service:  Completed, signed off  If discussed at [place] of Stay Meetings, dates discussed:    Additional Comments:  Ninfa Meeker, RN 02/24/2016, 11:23 AM

## 2016-02-24 NOTE — Evaluation (Signed)
Occupational Therapy Evaluation Patient Details Name: Tammy Sandoval MRN: [id number] DOB: 1956-01-08 Today's Date: 02/24/2016    History of Present Illness Pt admitted for R TKR via Dr Lyla Glassing on 02/23/16.   Clinical Impression   Pt overall supervision to min assist level for selfcare tasks. She will be at modified independent level for most tasks within a couple of days but will have initial supervision at home from spouse and daughter.  Recommend tub bench and spouse will look at purchasing from outside vendor.  No further OT needs at this time.     Follow Up Recommendations  No OT follow up;Supervision - Intermittent    Equipment Recommendations  None recommended by OT (Spouse will purchase tub seat if needed)    Recommendations for Other Services       Precautions / Restrictions Precautions Precautions: Fall Restrictions Weight Bearing Restrictions: No RLE Weight Bearing: Weight bearing as tolerated      Mobility Bed Mobility                  Transfers Overall transfer level: Needs assistance   Transfers: Sit to/from Stand Sit to Stand: Supervision              Balance     Sitting balance-Leahy Scale: Good     Standing balance support: Bilateral upper extremity supported Standing balance-Leahy Scale: Fair Standing balance comment: Pt able to stand at the sink to complete grooming tasks without UE support.                             ADL Overall ADL's : Needs assistance/impaired Eating/Feeding: Independent;Sitting   Grooming: Wash/dry hands;Wash/dry face;Oral care;Supervision/safety;Standing   Upper Body Bathing: Set up;Sitting   Lower Body Bathing: Set up;Sit to/from stand   Upper Body Dressing : Set up;Sitting   Lower Body Dressing: Minimal assistance;Sit to/from stand   Toilet Transfer: RW;BSC;Ambulation;Supervision/safety   Toileting- Water quality scientist and Hygiene: Supervision/safety;Sit to/from stand   Tub/  Shower Transfer: Tub transfer;Minimal assistance;Tub bench;Ambulation;Rolling walker   Functional mobility during ADLs: Supervision/safety General ADL Comments: Pt and spouse educated on DME needs and spouse will look into purchase of a tub/shower bench for home.  She has 3:1 and RW for use already.  Provided education on bathing and dressing techniuqes to begin dressing the right side first.       Vision Vision Assessment?: No apparent visual deficits   Perception Perception Perception Tested?: No   Praxis Praxis Praxis tested?: Within functional limits    Pertinent Vitals/Pain Pain Assessment: 0-10 Pain Location: 8 Pain Descriptors / Indicators: Crying;Discomfort Pain Intervention(s): Monitored during session;Limited activity within patient's tolerance     Hand Dominance Right   Extremity/Trunk Assessment Upper Extremity Assessment Upper Extremity Assessment: Overall WFL for tasks assessed   Lower Extremity Assessment Lower Extremity Assessment: Defer to PT evaluation   Cervical / Trunk Assessment Cervical / Trunk Assessment: Normal   Communication Communication Communication: No difficulties   Cognition Arousal/Alertness: Awake/alert Behavior During Therapy: WFL for tasks assessed/performed Overall Cognitive Status: Within Functional Limits for tasks assessed                                Home Living Family/patient expects to be discharged to:: Private residence Living Arrangements: Children;Spouse/significant other   Type of Home: House Home Access: Stairs to enter [employer] of Steps: 3 Entrance Stairs-Rails: Can  reach both Home Layout: One level;Able to live on main level with bedroom/bathroom (has a step down into the den)     Bathroom Shower/Tub: Tub/shower unit [employer] characteristics: Architectural technologist: Standard     Home Equipment: Bedside commode;Walker - 2 wheels          Prior Functioning/Environment Level  of Independence: Independent                                        End of Session CPM Right Knee CPM Right Knee: Off Nurse Communication: Mobility status  Activity Tolerance: Patient tolerated treatment well Patient left: in chair;with call bell/phone within reach;with family/visitor present   Time: 1155-2080 OT Time Calculation (min): 59 min Charges:  OT General Charges $OT Visit: 1 Procedure OT Evaluation $OT Eval Moderate Complexity: 1 Procedure OT Treatments $Self Care/Home Management : 38-52 mins  MCGUIRE,JAMES OTR/L 02/24/2016, 10:12 AM

## 2016-02-24 NOTE — Progress Notes (Signed)
Physical Therapy Treatment Patient Details Name: Tammy Sandoval MRN: [id number] DOB: 05-09-1955 Today's Date: 02/24/2016    History of Present Illness Pt admitted for R TKR via Dr Lyla Glassing on 02/23/16.    PT Comments    Pt still with significant pain but able to participate in therapy session. Able to perform stairs for simulated home entry with supervision. Pt's significant other present and educated on safety and technique for stair negotiation.  Pt and significant other report they feel safe for d/c home at this level.  Follow Up Recommendations  Outpatient PT     Equipment Recommendations  None recommended by PT    Recommendations for Other Services       Precautions / Restrictions Precautions Precautions: Fall Restrictions Weight Bearing Restrictions: No RLE Weight Bearing: Weight bearing as tolerated    Mobility  Bed Mobility Overal bed mobility: Needs Assistance Bed Mobility: Supine to Sit     Supine to sit: Supervision        Transfers Overall transfer level: Needs assistance Equipment used: Rolling walker (2 wheeled) Transfers: Sit to/from Stand Sit to Stand: Modified independent (Device/Increase time)         General transfer comment: cues for UE placement  Ambulation/Gait Ambulation/Gait assistance: Modified independent (Device/Increase time) Ambulation Distance (Feet): 200 Feet Assistive device: Rolling walker (2 wheeled)       General Gait Details: initial cues for step through pattern, progressed throughout session to mod I   Stairs Stairs: Yes Stairs assistance: Supervision Stair Management: Two rails Number of Stairs: 2 General stair comments: simluated home entry with pt and significant other present wtih pt able to perform without cuing for technique or safety. pt and significant other report they feel safe with stairs for home entry  Wheelchair Mobility    Modified Rankin (Stroke Patients Only)       Balance     Sitting  balance-Leahy Scale: Good     Standing balance support: Bilateral upper extremity supported Standing balance-Leahy Scale: Fair Standing balance comment: Pt able to stand at the sink to complete grooming tasks without UE support.                     Cognition Arousal/Alertness: Awake/alert Behavior During Therapy: WFL for tasks assessed/performed Overall Cognitive Status: Within Functional Limits for tasks assessed                      Exercises Total Joint Exercises Ankle Circles/Pumps: AROM;20 reps;Both Quad Sets: AROM;10 reps;Right Short Arc Quad: AROM;10 reps;Right Heel Slides: AROM;Right;10 reps Hip ABduction/ADduction: AROM;Right;10 reps    General Comments        Pertinent Vitals/Pain Pain Score: 7  Pain Descriptors / Indicators: Grimacing;Discomfort Pain Intervention(s): Limited activity within patient's tolerance;Monitored during session;Premedicated before session    Home Living Family/patient expects to be discharged to:: Private residence Living Arrangements: Children;Spouse/significant other   Type of Home: House Home Access: Stairs to enter Entrance Stairs-Rails: Can reach both Home Layout: One level;Able to live on main level with bedroom/bathroom (one step down into den) Home Equipment: Bedside commode;Walker - 2 wheels      Prior Function Level of Independence: Independent          PT Goals (current goals can now be found in the care plan section) Acute Rehab PT Goals Patient Stated Goal: go home soon PT Goal Formulation: With patient Time For Goal Achievement: 03/02/16 Potential to Achieve Goals: Good Progress towards PT goals: Progressing toward  goals    Frequency    7X/week      PT Plan Current plan remains appropriate    Co-evaluation             End of Session   Activity Tolerance: Patient tolerated treatment well Patient left: in chair;with call bell/phone within reach;with family/visitor present      Time: 8984-2103 PT Time Calculation (min) (ACUTE ONLY): 30 min  Charges:  $Gait Training: 23-37 mins $Therapeutic Exercise: 8-22 mins                    G Codes:      DONAWERTH,KAREN Mar 02, 2016, 1:52 PM

## 2016-02-24 NOTE — Discharge Summary (Signed)
Physician Discharge Summary  Patient ID: Tammy Sandoval MRN: [id number] DOB/AGE: 1955-09-22 60 y.o.  Admit date: 02/23/2016 Discharge date: 02/24/2016  Admission Diagnoses:  Osteoarthritis of right knee  Discharge Diagnoses:  Principal Problem:   Osteoarthritis of right knee   Past Medical History:  Diagnosis Date  . Anemia   . Anxiety   . Arthritis   . Cancer [facility])    skin-surgically removed  . Crescendo transient ischemic attacks    pt has no idea about this, has never had TIA's or strokes  . Depression   . Dyspnea    with exertion  . Generalized headaches   . GERD (gastroesophageal reflux disease)   . Gestational diabetes   . Insomnia   . Irritable bowel syndrome    mostly constipation  . Leg pain   . Low back pain   . Obesity   . Palpitations   . Restless leg   . Seizures ([place]) 2013   only one she has ever had, placed on neurontin  . Thrombophlebitis   . Ulcer ([place])    RLE  . Varicose veins     Surgeries: Procedure(s): COMPUTER ASSISTED TOTAL KNEE ARTHROPLASTY NAVIGATION on 02/23/2016   Consultants (if any):   Discharged Condition: Improved  Hospital Course: Tammy Sandoval is an 60 y.o. female who was admitted 02/23/2016 with a diagnosis of Osteoarthritis of right knee and went to the operating room on 02/23/2016 and underwent the above named procedures.    She was given perioperative antibiotics:  Anti-infectives    Start     Dose/Rate Route Frequency Ordered Stop   02/23/16 1800  ceFAZolin (ANCEF) IVPB 1 g/50 mL premix     1 g 100 mL/hr over 30 Minutes Intravenous Every 6 hours 02/23/16 1626 02/24/16 0104   02/23/16 0600  ceFAZolin (ANCEF) IVPB 2g/100 mL premix     2 g 200 mL/hr over 30 Minutes Intravenous On call to O.R. 02/22/16 1401 02/23/16 1226    .  She was given sequential compression devices, early ambulation, and apixaban for DVT prophylaxis.  She benefited maximally from the hospital stay and there were no complications.    Recent  vital signs:  Vitals:   02/24/16 0555 02/24/16 0600  BP: (!) 101/45 110/60  Pulse: (!) 106   Resp: 16   Temp: 97.9 F (36.6 C)     Recent laboratory studies:  Lab Results  Component Value Date   HGB 13.3 02/12/2016   HGB 11.6 (L) 09/22/2015   HGB 13.7 08/22/2015   Lab Results  Component Value Date   WBC 5.9 02/12/2016   PLT 312 02/12/2016   No results found for: INR Lab Results  Component Value Date   NA 139 02/12/2016   K 3.9 02/12/2016   CL 105 02/12/2016   CO2 27 02/12/2016   BUN 12 02/12/2016   CREATININE 0.71 02/12/2016   GLUCOSE 92 02/12/2016    Discharge Medications:     Medication List    STOP taking these medications   BC HEADACHE POWDER PO   oxyCODONE-acetaminophen 5-325 MG tablet Commonly known as:  PERCOCET/ROXICET   oxyCODONE-acetaminophen 7.5-325 MG tablet Commonly known as:  PERCOCET   tiZANidine 4 MG tablet Commonly known as:  ZANAFLEX     TAKE these medications   apixaban 2.5 MG Tabs tablet Commonly known as:  ELIQUIS Take 1 tablet (2.5 mg total) by mouth every 12 (twelve) hours.   ARIPiprazole 2 MG tablet Commonly known as:  ABILIFY Take 1  tablet (2 mg total) by mouth daily.   ARIPiprazole 5 MG tablet Commonly known as:  ABILIFY Take 5 mg by mouth daily.   docusate sodium 100 MG capsule Commonly known as:  COLACE Take 1 capsule (100 mg total) by mouth 2 (two) times daily.   gabapentin 600 MG tablet Commonly known as:  NEURONTIN Take 600 mg by mouth 3 (three) times daily. What changed:  Another medication with the same name was removed. Continue taking this medication, and follow the directions you see here.   linaclotide 145 MCG Caps capsule Commonly known as:  LINZESS Take 1 capsule (145 mcg total) by mouth daily.   methocarbamol 750 MG tablet Commonly known as:  ROBAXIN Take 750 mg by mouth every 6 (six) hours as needed for muscle spasms.   omeprazole 20 MG capsule Commonly known as:  PRILOSEC Take 1 capsule (20 mg  total) by mouth 2 (two) times daily before a meal.   ondansetron 4 MG tablet Commonly known as:  ZOFRAN Take 1 tablet (4 mg total) by mouth every 6 (six) hours as needed for nausea.   oxyCODONE 5 MG immediate release tablet Commonly known as:  Oxy IR/ROXICODONE Take 1-2 tablets (5-10 mg total) by mouth every 3 (three) hours as needed for breakthrough pain.   senna 8.6 MG Tabs tablet Commonly known as:  SENOKOT Take 2 tablets (17.2 mg total) by mouth at bedtime.   trazodone 300 MG tablet Commonly known as:  DESYREL Take 300 mg by mouth at bedtime.   venlafaxine XR 150 MG 24 hr capsule Commonly known as:  EFFEXOR-XR Take 1 capsule (150 mg total) by mouth daily.       Diagnostic Studies: Dg Knee Right Port  Result Date: 02/23/2016 CLINICAL DATA:  Post Right Total Knee Arthroplasty. EXAM: PORTABLE RIGHT KNEE - 1-2 VIEW COMPARISON:  08/26/2013 FINDINGS: Components of interval knee arthroplasty project in expected location. Negative for fracture or dislocation. Normal alignment. IMPRESSION: 1. Right knee arthroplasty without apparent complication. Electronically Signed   By: Lucrezia Europe M.D.   On: 02/23/2016 15:56    Disposition: 01-Home or Self Care  Discharge Instructions    Call MD / Call 911    Complete by:  As directed    If you experience chest pain or shortness of breath, CALL 911 and be transported to the hospital emergency room.  If you develope a fever above 101 F, pus (white drainage) or increased drainage or redness at the wound, or calf pain, call your surgeon's office.   Constipation Prevention    Complete by:  As directed    Drink plenty of fluids.  Prune juice may be helpful.  You may use a stool softener, such as Colace (over the counter) 100 mg twice a day.  Use MiraLax (over the counter) for constipation as needed.   Diet - low sodium heart healthy    Complete by:  As directed    Do not put a pillow under the knee. Place it under the heel.    Complete by:  As  directed    Driving restrictions    Complete by:  As directed    No driving for 6 weeks   Increase activity slowly as tolerated    Complete by:  As directed    Lifting restrictions    Complete by:  As directed    No lifting for 6 weeks   TED hose    Complete by:  As directed    Use  stockings (TED hose) for 2 weeks on both leg(s).  You may remove them at night for sleeping.      Follow-up Information    Swinteck, Horald Pollen, MD. Schedule an appointment as soon as possible for a visit in 2 week(s).   Specialty:  Orthopedic Surgery Why:  For wound re-check Contact information: [address] [place] [phone]            Signed: Elie Goody 02/24/2016, 8:10 AM

## 2016-02-24 NOTE — Progress Notes (Signed)
   Subjective:  Patient reports pain as mild to moderate.  No c/o.  Objective:   VITALS:   Vitals:   02/23/16 1946 02/24/16 0037 02/24/16 0555 02/24/16 0600  BP: 104/70 (!) 116/59 (!) 101/45 110/60  Pulse: 88 87 (!) 106   Resp: 16 16 16    Temp: 98.2 F (36.8 C) 98.4 F (36.9 C) 97.9 F (36.6 C)   TempSrc: Oral Oral Oral   SpO2: 96% 96% (!) 88% 92%  Weight:        Neurologically intact ABD soft Sensation intact distally Intact pulses distally Dorsiflexion/Plantar flexion intact Incision: dressing C/D/I Compartment soft   Lab Results  Component Value Date   WBC 5.9 02/12/2016   HGB 13.3 02/12/2016   HCT 41.0 02/12/2016   MCV 91.3 02/12/2016   PLT 312 02/12/2016   BMET    Component Value Date/Time   NA 139 02/12/2016 1040   K 3.9 02/12/2016 1040   CL 105 02/12/2016 1040   CO2 27 02/12/2016 1040   GLUCOSE 92 02/12/2016 1040   BUN 12 02/12/2016 1040   CREATININE 0.71 02/12/2016 1040   CALCIUM 9.1 02/12/2016 1040   GFRNONAA >60 02/12/2016 1040   GFRAA >60 02/12/2016 1040     Assessment/Plan: 1 Day Post-Op   Principal Problem:   Osteoarthritis of right knee   WBAT with walker DVT ppx: apixaban, SCDs, TEDs PO pain control PT/OT AM labs pending Dispo: D/C home after clears therapy, today vs tomorrow   Tammy Sandoval 02/24/2016, 8:06 AM   Rod Can, MD Cell [phone]

## 2016-04-21 DIAGNOSIS — M1711 Unilateral primary osteoarthritis, right knee: Secondary | ICD-10-CM | POA: Diagnosis not present

## 2016-04-26 DIAGNOSIS — M1711 Unilateral primary osteoarthritis, right knee: Secondary | ICD-10-CM | POA: Diagnosis not present

## 2016-04-28 DIAGNOSIS — M1711 Unilateral primary osteoarthritis, right knee: Secondary | ICD-10-CM | POA: Diagnosis not present

## 2016-05-03 DIAGNOSIS — M1711 Unilateral primary osteoarthritis, right knee: Secondary | ICD-10-CM | POA: Diagnosis not present

## 2016-05-10 DIAGNOSIS — M47817 Spondylosis without myelopathy or radiculopathy, lumbosacral region: Secondary | ICD-10-CM | POA: Diagnosis not present

## 2016-05-10 DIAGNOSIS — M791 Myalgia: Secondary | ICD-10-CM | POA: Diagnosis not present

## 2016-05-10 DIAGNOSIS — M1711 Unilateral primary osteoarthritis, right knee: Secondary | ICD-10-CM | POA: Diagnosis not present

## 2016-05-12 DIAGNOSIS — M1711 Unilateral primary osteoarthritis, right knee: Secondary | ICD-10-CM | POA: Diagnosis not present

## 2016-05-17 DIAGNOSIS — M1711 Unilateral primary osteoarthritis, right knee: Secondary | ICD-10-CM | POA: Diagnosis not present

## 2016-05-19 DIAGNOSIS — M1711 Unilateral primary osteoarthritis, right knee: Secondary | ICD-10-CM | POA: Diagnosis not present

## 2016-05-25 DIAGNOSIS — Z96651 Presence of right artificial knee joint: Secondary | ICD-10-CM | POA: Diagnosis not present

## 2016-05-25 DIAGNOSIS — Z471 Aftercare following joint replacement surgery: Secondary | ICD-10-CM | POA: Diagnosis not present

## 2016-06-17 DIAGNOSIS — F331 Major depressive disorder, recurrent, moderate: Secondary | ICD-10-CM | POA: Diagnosis not present

## 2016-06-17 DIAGNOSIS — F411 Generalized anxiety disorder: Secondary | ICD-10-CM | POA: Diagnosis not present

## 2016-07-06 DIAGNOSIS — M791 Myalgia: Secondary | ICD-10-CM | POA: Diagnosis not present

## 2016-07-06 DIAGNOSIS — M47812 Spondylosis without myelopathy or radiculopathy, cervical region: Secondary | ICD-10-CM | POA: Diagnosis not present

## 2016-07-20 DIAGNOSIS — Z6828 Body mass index (BMI) 28.0-28.9, adult: Secondary | ICD-10-CM | POA: Diagnosis not present

## 2016-07-20 DIAGNOSIS — M542 Cervicalgia: Secondary | ICD-10-CM | POA: Diagnosis not present

## 2016-07-20 DIAGNOSIS — M47892 Other spondylosis, cervical region: Secondary | ICD-10-CM | POA: Diagnosis not present

## 2016-08-05 DIAGNOSIS — S93401A Sprain of unspecified ligament of right ankle, initial encounter: Secondary | ICD-10-CM | POA: Diagnosis not present

## 2016-09-09 DIAGNOSIS — M47812 Spondylosis without myelopathy or radiculopathy, cervical region: Secondary | ICD-10-CM | POA: Diagnosis not present

## 2016-09-09 DIAGNOSIS — Z79891 Long term (current) use of opiate analgesic: Secondary | ICD-10-CM | POA: Diagnosis not present

## 2016-09-09 DIAGNOSIS — M47817 Spondylosis without myelopathy or radiculopathy, lumbosacral region: Secondary | ICD-10-CM | POA: Diagnosis not present

## 2016-09-09 DIAGNOSIS — M791 Myalgia: Secondary | ICD-10-CM | POA: Diagnosis not present

## 2016-09-09 DIAGNOSIS — G894 Chronic pain syndrome: Secondary | ICD-10-CM | POA: Diagnosis not present

## 2016-09-15 DIAGNOSIS — F331 Major depressive disorder, recurrent, moderate: Secondary | ICD-10-CM | POA: Diagnosis not present

## 2016-09-15 DIAGNOSIS — F411 Generalized anxiety disorder: Secondary | ICD-10-CM | POA: Diagnosis not present

## 2016-09-21 DIAGNOSIS — Z0000 Encounter for general adult medical examination without abnormal findings: Secondary | ICD-10-CM | POA: Diagnosis not present

## 2016-09-21 DIAGNOSIS — K589 Irritable bowel syndrome without diarrhea: Secondary | ICD-10-CM | POA: Diagnosis not present

## 2016-09-21 DIAGNOSIS — Z136 Encounter for screening for cardiovascular disorders: Secondary | ICD-10-CM | POA: Diagnosis not present

## 2016-09-21 DIAGNOSIS — R7303 Prediabetes: Secondary | ICD-10-CM | POA: Diagnosis not present

## 2016-09-21 DIAGNOSIS — M1710 Unilateral primary osteoarthritis, unspecified knee: Secondary | ICD-10-CM | POA: Diagnosis not present

## 2016-09-21 DIAGNOSIS — G4700 Insomnia, unspecified: Secondary | ICD-10-CM | POA: Diagnosis not present

## 2016-09-21 DIAGNOSIS — Z5181 Encounter for therapeutic drug level monitoring: Secondary | ICD-10-CM | POA: Diagnosis not present

## 2016-09-21 DIAGNOSIS — F418 Other specified anxiety disorders: Secondary | ICD-10-CM | POA: Diagnosis not present

## 2016-09-21 DIAGNOSIS — G629 Polyneuropathy, unspecified: Secondary | ICD-10-CM | POA: Diagnosis not present

## 2016-09-21 DIAGNOSIS — Z113 Encounter for screening for infections with a predominantly sexual mode of transmission: Secondary | ICD-10-CM | POA: Diagnosis not present

## 2016-09-21 DIAGNOSIS — Z01118 Encounter for examination of ears and hearing with other abnormal findings: Secondary | ICD-10-CM | POA: Diagnosis not present

## 2016-09-21 DIAGNOSIS — Z1383 Encounter for screening for respiratory disorder NEC: Secondary | ICD-10-CM | POA: Diagnosis not present

## 2016-09-21 DIAGNOSIS — Z131 Encounter for screening for diabetes mellitus: Secondary | ICD-10-CM | POA: Diagnosis not present

## 2016-09-21 DIAGNOSIS — K219 Gastro-esophageal reflux disease without esophagitis: Secondary | ICD-10-CM | POA: Diagnosis not present

## 2016-09-21 DIAGNOSIS — H538 Other visual disturbances: Secondary | ICD-10-CM | POA: Diagnosis not present

## 2016-09-23 DIAGNOSIS — R1013 Epigastric pain: Secondary | ICD-10-CM | POA: Diagnosis not present

## 2016-09-29 DIAGNOSIS — K219 Gastro-esophageal reflux disease without esophagitis: Secondary | ICD-10-CM | POA: Diagnosis not present

## 2016-09-29 DIAGNOSIS — K581 Irritable bowel syndrome with constipation: Secondary | ICD-10-CM | POA: Diagnosis not present

## 2016-09-29 DIAGNOSIS — Z1211 Encounter for screening for malignant neoplasm of colon: Secondary | ICD-10-CM | POA: Diagnosis not present

## 2016-10-01 DIAGNOSIS — I509 Heart failure, unspecified: Secondary | ICD-10-CM | POA: Diagnosis not present

## 2016-10-01 DIAGNOSIS — I259 Chronic ischemic heart disease, unspecified: Secondary | ICD-10-CM | POA: Diagnosis not present

## 2016-10-07 DIAGNOSIS — K635 Polyp of colon: Secondary | ICD-10-CM | POA: Diagnosis not present

## 2016-10-07 DIAGNOSIS — D123 Benign neoplasm of transverse colon: Secondary | ICD-10-CM | POA: Diagnosis not present

## 2016-10-07 DIAGNOSIS — Z1211 Encounter for screening for malignant neoplasm of colon: Secondary | ICD-10-CM | POA: Diagnosis not present

## 2016-10-11 DIAGNOSIS — M47817 Spondylosis without myelopathy or radiculopathy, lumbosacral region: Secondary | ICD-10-CM | POA: Diagnosis not present

## 2016-10-11 DIAGNOSIS — G894 Chronic pain syndrome: Secondary | ICD-10-CM | POA: Diagnosis not present

## 2016-10-19 DIAGNOSIS — Z0000 Encounter for general adult medical examination without abnormal findings: Secondary | ICD-10-CM | POA: Diagnosis not present

## 2016-10-19 DIAGNOSIS — R7303 Prediabetes: Secondary | ICD-10-CM | POA: Diagnosis not present

## 2016-10-19 DIAGNOSIS — D509 Iron deficiency anemia, unspecified: Secondary | ICD-10-CM | POA: Diagnosis not present

## 2016-10-19 DIAGNOSIS — G4700 Insomnia, unspecified: Secondary | ICD-10-CM | POA: Diagnosis not present

## 2016-10-19 DIAGNOSIS — K219 Gastro-esophageal reflux disease without esophagitis: Secondary | ICD-10-CM | POA: Diagnosis not present

## 2016-10-19 DIAGNOSIS — E785 Hyperlipidemia, unspecified: Secondary | ICD-10-CM | POA: Diagnosis not present

## 2016-10-19 DIAGNOSIS — F418 Other specified anxiety disorders: Secondary | ICD-10-CM | POA: Diagnosis not present

## 2016-10-19 DIAGNOSIS — K589 Irritable bowel syndrome without diarrhea: Secondary | ICD-10-CM | POA: Diagnosis not present

## 2016-10-19 DIAGNOSIS — M1710 Unilateral primary osteoarthritis, unspecified knee: Secondary | ICD-10-CM | POA: Diagnosis not present

## 2016-10-19 DIAGNOSIS — G629 Polyneuropathy, unspecified: Secondary | ICD-10-CM | POA: Diagnosis not present

## 2016-11-25 DIAGNOSIS — T402X5A Adverse effect of other opioids, initial encounter: Secondary | ICD-10-CM | POA: Diagnosis not present

## 2016-11-25 DIAGNOSIS — M791 Myalgia: Secondary | ICD-10-CM | POA: Diagnosis not present

## 2016-11-25 DIAGNOSIS — M170 Bilateral primary osteoarthritis of knee: Secondary | ICD-10-CM | POA: Diagnosis not present

## 2016-11-25 DIAGNOSIS — G8929 Other chronic pain: Secondary | ICD-10-CM | POA: Diagnosis not present

## 2016-11-25 DIAGNOSIS — M2550 Pain in unspecified joint: Secondary | ICD-10-CM | POA: Diagnosis not present

## 2016-11-25 DIAGNOSIS — M47816 Spondylosis without myelopathy or radiculopathy, lumbar region: Secondary | ICD-10-CM | POA: Diagnosis not present

## 2016-11-25 DIAGNOSIS — Z79899 Other long term (current) drug therapy: Secondary | ICD-10-CM | POA: Diagnosis not present

## 2016-11-25 DIAGNOSIS — M47812 Spondylosis without myelopathy or radiculopathy, cervical region: Secondary | ICD-10-CM | POA: Diagnosis not present

## 2016-11-25 DIAGNOSIS — K5903 Drug induced constipation: Secondary | ICD-10-CM | POA: Diagnosis not present

## 2016-11-26 DIAGNOSIS — K219 Gastro-esophageal reflux disease without esophagitis: Secondary | ICD-10-CM | POA: Diagnosis not present

## 2016-11-26 DIAGNOSIS — R635 Abnormal weight gain: Secondary | ICD-10-CM | POA: Diagnosis not present

## 2016-11-26 DIAGNOSIS — G629 Polyneuropathy, unspecified: Secondary | ICD-10-CM | POA: Diagnosis not present

## 2016-11-26 DIAGNOSIS — G4700 Insomnia, unspecified: Secondary | ICD-10-CM | POA: Diagnosis not present

## 2016-11-26 DIAGNOSIS — K589 Irritable bowel syndrome without diarrhea: Secondary | ICD-10-CM | POA: Diagnosis not present

## 2016-11-26 DIAGNOSIS — M1710 Unilateral primary osteoarthritis, unspecified knee: Secondary | ICD-10-CM | POA: Diagnosis not present

## 2016-11-26 DIAGNOSIS — D509 Iron deficiency anemia, unspecified: Secondary | ICD-10-CM | POA: Diagnosis not present

## 2016-11-26 DIAGNOSIS — R7303 Prediabetes: Secondary | ICD-10-CM | POA: Diagnosis not present

## 2016-11-26 DIAGNOSIS — F418 Other specified anxiety disorders: Secondary | ICD-10-CM | POA: Diagnosis not present

## 2016-11-26 DIAGNOSIS — E785 Hyperlipidemia, unspecified: Secondary | ICD-10-CM | POA: Diagnosis not present

## 2016-11-30 DIAGNOSIS — M170 Bilateral primary osteoarthritis of knee: Secondary | ICD-10-CM | POA: Diagnosis not present

## 2016-11-30 DIAGNOSIS — Z888 Allergy status to other drugs, medicaments and biological substances status: Secondary | ICD-10-CM | POA: Diagnosis not present

## 2016-11-30 DIAGNOSIS — Z79899 Other long term (current) drug therapy: Secondary | ICD-10-CM | POA: Diagnosis not present

## 2016-11-30 DIAGNOSIS — M791 Myalgia: Secondary | ICD-10-CM | POA: Diagnosis not present

## 2016-11-30 DIAGNOSIS — Z91040 Latex allergy status: Secondary | ICD-10-CM | POA: Diagnosis not present

## 2016-11-30 DIAGNOSIS — G8929 Other chronic pain: Secondary | ICD-10-CM | POA: Diagnosis not present

## 2016-11-30 DIAGNOSIS — M47816 Spondylosis without myelopathy or radiculopathy, lumbar region: Secondary | ICD-10-CM | POA: Diagnosis not present

## 2016-12-22 DIAGNOSIS — Z96651 Presence of right artificial knee joint: Secondary | ICD-10-CM | POA: Diagnosis not present

## 2016-12-22 DIAGNOSIS — M47812 Spondylosis without myelopathy or radiculopathy, cervical region: Secondary | ICD-10-CM | POA: Diagnosis not present

## 2016-12-22 DIAGNOSIS — G8929 Other chronic pain: Secondary | ICD-10-CM | POA: Diagnosis not present

## 2016-12-22 DIAGNOSIS — T402X5A Adverse effect of other opioids, initial encounter: Secondary | ICD-10-CM | POA: Diagnosis not present

## 2016-12-22 DIAGNOSIS — M170 Bilateral primary osteoarthritis of knee: Secondary | ICD-10-CM | POA: Diagnosis not present

## 2016-12-22 DIAGNOSIS — G894 Chronic pain syndrome: Secondary | ICD-10-CM | POA: Diagnosis not present

## 2016-12-22 DIAGNOSIS — M791 Myalgia: Secondary | ICD-10-CM | POA: Diagnosis not present

## 2016-12-22 DIAGNOSIS — M1712 Unilateral primary osteoarthritis, left knee: Secondary | ICD-10-CM | POA: Diagnosis not present

## 2016-12-22 DIAGNOSIS — M47816 Spondylosis without myelopathy or radiculopathy, lumbar region: Secondary | ICD-10-CM | POA: Diagnosis not present

## 2016-12-22 DIAGNOSIS — K5903 Drug induced constipation: Secondary | ICD-10-CM | POA: Diagnosis not present

## 2016-12-22 DIAGNOSIS — Z79891 Long term (current) use of opiate analgesic: Secondary | ICD-10-CM | POA: Diagnosis not present

## 2016-12-23 DIAGNOSIS — G629 Polyneuropathy, unspecified: Secondary | ICD-10-CM | POA: Diagnosis not present

## 2016-12-23 DIAGNOSIS — R7303 Prediabetes: Secondary | ICD-10-CM | POA: Diagnosis not present

## 2016-12-23 DIAGNOSIS — D509 Iron deficiency anemia, unspecified: Secondary | ICD-10-CM | POA: Diagnosis not present

## 2016-12-23 DIAGNOSIS — I739 Peripheral vascular disease, unspecified: Secondary | ICD-10-CM | POA: Diagnosis not present

## 2016-12-23 DIAGNOSIS — E785 Hyperlipidemia, unspecified: Secondary | ICD-10-CM | POA: Diagnosis not present

## 2016-12-23 DIAGNOSIS — M1710 Unilateral primary osteoarthritis, unspecified knee: Secondary | ICD-10-CM | POA: Diagnosis not present

## 2016-12-23 DIAGNOSIS — K219 Gastro-esophageal reflux disease without esophagitis: Secondary | ICD-10-CM | POA: Diagnosis not present

## 2016-12-23 DIAGNOSIS — K589 Irritable bowel syndrome without diarrhea: Secondary | ICD-10-CM | POA: Diagnosis not present

## 2016-12-23 DIAGNOSIS — F418 Other specified anxiety disorders: Secondary | ICD-10-CM | POA: Diagnosis not present

## 2016-12-23 DIAGNOSIS — G4700 Insomnia, unspecified: Secondary | ICD-10-CM | POA: Diagnosis not present

## 2017-01-03 DIAGNOSIS — M791 Myalgia: Secondary | ICD-10-CM | POA: Diagnosis not present

## 2017-01-03 DIAGNOSIS — Z79891 Long term (current) use of opiate analgesic: Secondary | ICD-10-CM | POA: Diagnosis not present

## 2017-01-03 DIAGNOSIS — M47816 Spondylosis without myelopathy or radiculopathy, lumbar region: Secondary | ICD-10-CM | POA: Diagnosis not present

## 2017-01-03 DIAGNOSIS — G894 Chronic pain syndrome: Secondary | ICD-10-CM | POA: Diagnosis not present

## 2017-01-03 DIAGNOSIS — M170 Bilateral primary osteoarthritis of knee: Secondary | ICD-10-CM | POA: Diagnosis not present

## 2017-01-03 DIAGNOSIS — K5903 Drug induced constipation: Secondary | ICD-10-CM | POA: Diagnosis not present

## 2017-01-03 DIAGNOSIS — T402X5A Adverse effect of other opioids, initial encounter: Secondary | ICD-10-CM | POA: Diagnosis not present

## 2017-01-03 DIAGNOSIS — G8929 Other chronic pain: Secondary | ICD-10-CM | POA: Diagnosis not present

## 2017-01-03 DIAGNOSIS — M47812 Spondylosis without myelopathy or radiculopathy, cervical region: Secondary | ICD-10-CM | POA: Diagnosis not present

## 2017-01-21 DIAGNOSIS — F411 Generalized anxiety disorder: Secondary | ICD-10-CM | POA: Diagnosis not present

## 2017-01-21 DIAGNOSIS — F331 Major depressive disorder, recurrent, moderate: Secondary | ICD-10-CM | POA: Diagnosis not present

## 2017-01-27 DIAGNOSIS — R7303 Prediabetes: Secondary | ICD-10-CM | POA: Diagnosis not present

## 2017-01-27 DIAGNOSIS — M25572 Pain in left ankle and joints of left foot: Secondary | ICD-10-CM | POA: Diagnosis not present

## 2017-01-27 DIAGNOSIS — K589 Irritable bowel syndrome without diarrhea: Secondary | ICD-10-CM | POA: Diagnosis not present

## 2017-01-27 DIAGNOSIS — F418 Other specified anxiety disorders: Secondary | ICD-10-CM | POA: Diagnosis not present

## 2017-01-27 DIAGNOSIS — D509 Iron deficiency anemia, unspecified: Secondary | ICD-10-CM | POA: Diagnosis not present

## 2017-01-27 DIAGNOSIS — E785 Hyperlipidemia, unspecified: Secondary | ICD-10-CM | POA: Diagnosis not present

## 2017-01-27 DIAGNOSIS — K219 Gastro-esophageal reflux disease without esophagitis: Secondary | ICD-10-CM | POA: Diagnosis not present

## 2017-01-27 DIAGNOSIS — G4700 Insomnia, unspecified: Secondary | ICD-10-CM | POA: Diagnosis not present

## 2017-01-27 DIAGNOSIS — G629 Polyneuropathy, unspecified: Secondary | ICD-10-CM | POA: Diagnosis not present

## 2017-02-02 ENCOUNTER — Other Ambulatory Visit: Payer: Self-pay | Admitting: Gastroenterology

## 2017-02-02 DIAGNOSIS — R634 Abnormal weight loss: Secondary | ICD-10-CM

## 2017-02-02 DIAGNOSIS — R609 Edema, unspecified: Secondary | ICD-10-CM | POA: Diagnosis not present

## 2017-02-02 DIAGNOSIS — R0602 Shortness of breath: Secondary | ICD-10-CM | POA: Diagnosis not present

## 2017-02-02 DIAGNOSIS — R140 Abdominal distension (gaseous): Secondary | ICD-10-CM | POA: Diagnosis not present

## 2017-02-04 ENCOUNTER — Ambulatory Visit
Admission: RE | Admit: 2017-02-04 | Discharge: 2017-02-04 | Disposition: A | Discharge status: Home / Self Care | Payer: Medicare Other | Source: Ambulatory Visit | Attending: Gastroenterology | Admitting: Gastroenterology

## 2017-02-04 DIAGNOSIS — R634 Abnormal weight loss: Secondary | ICD-10-CM

## 2017-02-04 DIAGNOSIS — R1900 Intra-abdominal and pelvic swelling, mass and lump, unspecified site: Secondary | ICD-10-CM | POA: Diagnosis not present

## 2017-02-04 MED ORDER — IOPAMIDOL (ISOVUE-300) INJECTION 61%
100.0000 mL | Freq: Once | INTRAVENOUS | Status: AC | PRN
  Administered 2017-02-04: 100 mL via INTRAVENOUS

## 2017-02-07 DIAGNOSIS — G894 Chronic pain syndrome: Secondary | ICD-10-CM | POA: Diagnosis not present

## 2017-02-07 DIAGNOSIS — Z79891 Long term (current) use of opiate analgesic: Secondary | ICD-10-CM | POA: Diagnosis not present

## 2017-02-07 DIAGNOSIS — M47812 Spondylosis without myelopathy or radiculopathy, cervical region: Secondary | ICD-10-CM | POA: Diagnosis not present

## 2017-02-07 DIAGNOSIS — G8929 Other chronic pain: Secondary | ICD-10-CM | POA: Diagnosis not present

## 2017-02-07 DIAGNOSIS — M7918 Myalgia, other site: Secondary | ICD-10-CM | POA: Diagnosis not present

## 2017-02-07 DIAGNOSIS — M170 Bilateral primary osteoarthritis of knee: Secondary | ICD-10-CM | POA: Diagnosis not present

## 2017-02-07 DIAGNOSIS — M47816 Spondylosis without myelopathy or radiculopathy, lumbar region: Secondary | ICD-10-CM | POA: Diagnosis not present

## 2017-02-23 DIAGNOSIS — G8929 Other chronic pain: Secondary | ICD-10-CM | POA: Diagnosis not present

## 2017-02-23 DIAGNOSIS — M7918 Myalgia, other site: Secondary | ICD-10-CM | POA: Diagnosis not present

## 2017-03-07 DIAGNOSIS — R140 Abdominal distension (gaseous): Secondary | ICD-10-CM | POA: Diagnosis not present

## 2017-03-23 DIAGNOSIS — H04123 Dry eye syndrome of bilateral lacrimal glands: Secondary | ICD-10-CM | POA: Diagnosis not present

## 2017-03-23 DIAGNOSIS — H40033 Anatomical narrow angle, bilateral: Secondary | ICD-10-CM | POA: Diagnosis not present

## 2017-03-25 DIAGNOSIS — G8929 Other chronic pain: Secondary | ICD-10-CM | POA: Diagnosis not present

## 2017-03-25 DIAGNOSIS — M4722 Other spondylosis with radiculopathy, cervical region: Secondary | ICD-10-CM | POA: Diagnosis not present

## 2017-03-25 DIAGNOSIS — M47812 Spondylosis without myelopathy or radiculopathy, cervical region: Secondary | ICD-10-CM | POA: Diagnosis not present

## 2017-03-25 DIAGNOSIS — M170 Bilateral primary osteoarthritis of knee: Secondary | ICD-10-CM | POA: Diagnosis not present

## 2017-03-25 DIAGNOSIS — M7918 Myalgia, other site: Secondary | ICD-10-CM | POA: Diagnosis not present

## 2017-03-25 DIAGNOSIS — G894 Chronic pain syndrome: Secondary | ICD-10-CM | POA: Diagnosis not present

## 2017-03-25 DIAGNOSIS — M47816 Spondylosis without myelopathy or radiculopathy, lumbar region: Secondary | ICD-10-CM | POA: Diagnosis not present

## 2017-04-27 DIAGNOSIS — F331 Major depressive disorder, recurrent, moderate: Secondary | ICD-10-CM | POA: Diagnosis not present

## 2017-04-27 DIAGNOSIS — F411 Generalized anxiety disorder: Secondary | ICD-10-CM | POA: Diagnosis not present

## 2017-05-02 DIAGNOSIS — G629 Polyneuropathy, unspecified: Secondary | ICD-10-CM | POA: Diagnosis not present

## 2017-05-02 DIAGNOSIS — E785 Hyperlipidemia, unspecified: Secondary | ICD-10-CM | POA: Diagnosis not present

## 2017-05-02 DIAGNOSIS — R609 Edema, unspecified: Secondary | ICD-10-CM | POA: Diagnosis not present

## 2017-05-02 DIAGNOSIS — M25579 Pain in unspecified ankle and joints of unspecified foot: Secondary | ICD-10-CM | POA: Diagnosis not present

## 2017-05-02 DIAGNOSIS — K589 Irritable bowel syndrome without diarrhea: Secondary | ICD-10-CM | POA: Diagnosis not present

## 2017-05-02 DIAGNOSIS — G4700 Insomnia, unspecified: Secondary | ICD-10-CM | POA: Diagnosis not present

## 2017-05-02 DIAGNOSIS — E669 Obesity, unspecified: Secondary | ICD-10-CM | POA: Diagnosis not present

## 2017-05-02 DIAGNOSIS — D509 Iron deficiency anemia, unspecified: Secondary | ICD-10-CM | POA: Diagnosis not present

## 2017-05-02 DIAGNOSIS — K219 Gastro-esophageal reflux disease without esophagitis: Secondary | ICD-10-CM | POA: Diagnosis not present

## 2017-05-02 DIAGNOSIS — F418 Other specified anxiety disorders: Secondary | ICD-10-CM | POA: Diagnosis not present

## 2017-05-02 DIAGNOSIS — R7303 Prediabetes: Secondary | ICD-10-CM | POA: Diagnosis not present

## 2017-05-25 DIAGNOSIS — D509 Iron deficiency anemia, unspecified: Secondary | ICD-10-CM | POA: Diagnosis not present

## 2017-05-25 DIAGNOSIS — G4700 Insomnia, unspecified: Secondary | ICD-10-CM | POA: Diagnosis not present

## 2017-05-25 DIAGNOSIS — F418 Other specified anxiety disorders: Secondary | ICD-10-CM | POA: Diagnosis not present

## 2017-05-25 DIAGNOSIS — R7303 Prediabetes: Secondary | ICD-10-CM | POA: Diagnosis not present

## 2017-05-25 DIAGNOSIS — K589 Irritable bowel syndrome without diarrhea: Secondary | ICD-10-CM | POA: Diagnosis not present

## 2017-05-25 DIAGNOSIS — E785 Hyperlipidemia, unspecified: Secondary | ICD-10-CM | POA: Diagnosis not present

## 2017-05-25 DIAGNOSIS — G629 Polyneuropathy, unspecified: Secondary | ICD-10-CM | POA: Diagnosis not present

## 2017-05-25 DIAGNOSIS — M25579 Pain in unspecified ankle and joints of unspecified foot: Secondary | ICD-10-CM | POA: Diagnosis not present

## 2017-05-25 DIAGNOSIS — E669 Obesity, unspecified: Secondary | ICD-10-CM | POA: Diagnosis not present

## 2017-05-25 DIAGNOSIS — R609 Edema, unspecified: Secondary | ICD-10-CM | POA: Diagnosis not present

## 2017-05-25 DIAGNOSIS — K219 Gastro-esophageal reflux disease without esophagitis: Secondary | ICD-10-CM | POA: Diagnosis not present

## 2017-05-30 DIAGNOSIS — G4700 Insomnia, unspecified: Secondary | ICD-10-CM | POA: Diagnosis not present

## 2017-05-30 DIAGNOSIS — M25579 Pain in unspecified ankle and joints of unspecified foot: Secondary | ICD-10-CM | POA: Diagnosis not present

## 2017-05-30 DIAGNOSIS — R7303 Prediabetes: Secondary | ICD-10-CM | POA: Diagnosis not present

## 2017-05-30 DIAGNOSIS — K589 Irritable bowel syndrome without diarrhea: Secondary | ICD-10-CM | POA: Diagnosis not present

## 2017-05-30 DIAGNOSIS — K219 Gastro-esophageal reflux disease without esophagitis: Secondary | ICD-10-CM | POA: Diagnosis not present

## 2017-05-30 DIAGNOSIS — G629 Polyneuropathy, unspecified: Secondary | ICD-10-CM | POA: Diagnosis not present

## 2017-05-30 DIAGNOSIS — D509 Iron deficiency anemia, unspecified: Secondary | ICD-10-CM | POA: Diagnosis not present

## 2017-05-30 DIAGNOSIS — R609 Edema, unspecified: Secondary | ICD-10-CM | POA: Diagnosis not present

## 2017-05-30 DIAGNOSIS — E785 Hyperlipidemia, unspecified: Secondary | ICD-10-CM | POA: Diagnosis not present

## 2017-05-30 DIAGNOSIS — E669 Obesity, unspecified: Secondary | ICD-10-CM | POA: Diagnosis not present

## 2017-05-30 DIAGNOSIS — F418 Other specified anxiety disorders: Secondary | ICD-10-CM | POA: Diagnosis not present

## 2017-06-02 DIAGNOSIS — M47897 Other spondylosis, lumbosacral region: Secondary | ICD-10-CM | POA: Diagnosis not present

## 2017-06-02 DIAGNOSIS — M7918 Myalgia, other site: Secondary | ICD-10-CM | POA: Diagnosis not present

## 2017-06-02 DIAGNOSIS — G8929 Other chronic pain: Secondary | ICD-10-CM | POA: Diagnosis not present

## 2017-06-02 DIAGNOSIS — M545 Low back pain: Secondary | ICD-10-CM | POA: Diagnosis not present

## 2017-06-02 DIAGNOSIS — G894 Chronic pain syndrome: Secondary | ICD-10-CM | POA: Diagnosis not present

## 2017-06-02 DIAGNOSIS — M5136 Other intervertebral disc degeneration, lumbar region: Secondary | ICD-10-CM | POA: Diagnosis not present

## 2017-06-02 DIAGNOSIS — M47896 Other spondylosis, lumbar region: Secondary | ICD-10-CM | POA: Diagnosis not present

## 2017-06-02 DIAGNOSIS — M47816 Spondylosis without myelopathy or radiculopathy, lumbar region: Secondary | ICD-10-CM | POA: Diagnosis not present

## 2017-06-10 DIAGNOSIS — G629 Polyneuropathy, unspecified: Secondary | ICD-10-CM | POA: Diagnosis not present

## 2017-06-13 DIAGNOSIS — R202 Paresthesia of skin: Secondary | ICD-10-CM | POA: Diagnosis not present

## 2017-06-13 DIAGNOSIS — F419 Anxiety disorder, unspecified: Secondary | ICD-10-CM | POA: Diagnosis not present

## 2017-06-29 DIAGNOSIS — R202 Paresthesia of skin: Secondary | ICD-10-CM | POA: Diagnosis not present

## 2017-06-29 DIAGNOSIS — G629 Polyneuropathy, unspecified: Secondary | ICD-10-CM | POA: Diagnosis not present

## 2017-06-30 DIAGNOSIS — M47816 Spondylosis without myelopathy or radiculopathy, lumbar region: Secondary | ICD-10-CM | POA: Diagnosis not present

## 2017-06-30 DIAGNOSIS — G894 Chronic pain syndrome: Secondary | ICD-10-CM | POA: Diagnosis not present

## 2017-06-30 DIAGNOSIS — M7918 Myalgia, other site: Secondary | ICD-10-CM | POA: Diagnosis not present

## 2017-07-11 DIAGNOSIS — F418 Other specified anxiety disorders: Secondary | ICD-10-CM | POA: Diagnosis not present

## 2017-07-11 DIAGNOSIS — E669 Obesity, unspecified: Secondary | ICD-10-CM | POA: Diagnosis not present

## 2017-07-11 DIAGNOSIS — G629 Polyneuropathy, unspecified: Secondary | ICD-10-CM | POA: Diagnosis not present

## 2017-07-11 DIAGNOSIS — D509 Iron deficiency anemia, unspecified: Secondary | ICD-10-CM | POA: Diagnosis not present

## 2017-07-11 DIAGNOSIS — K219 Gastro-esophageal reflux disease without esophagitis: Secondary | ICD-10-CM | POA: Diagnosis not present

## 2017-07-11 DIAGNOSIS — R609 Edema, unspecified: Secondary | ICD-10-CM | POA: Diagnosis not present

## 2017-07-11 DIAGNOSIS — R7303 Prediabetes: Secondary | ICD-10-CM | POA: Diagnosis not present

## 2017-07-11 DIAGNOSIS — G4700 Insomnia, unspecified: Secondary | ICD-10-CM | POA: Diagnosis not present

## 2017-07-11 DIAGNOSIS — K589 Irritable bowel syndrome without diarrhea: Secondary | ICD-10-CM | POA: Diagnosis not present

## 2017-07-11 DIAGNOSIS — M25579 Pain in unspecified ankle and joints of unspecified foot: Secondary | ICD-10-CM | POA: Diagnosis not present

## 2017-07-11 DIAGNOSIS — E785 Hyperlipidemia, unspecified: Secondary | ICD-10-CM | POA: Diagnosis not present

## 2017-07-11 DIAGNOSIS — I809 Phlebitis and thrombophlebitis of unspecified site: Secondary | ICD-10-CM | POA: Diagnosis not present

## 2017-07-14 DIAGNOSIS — M47816 Spondylosis without myelopathy or radiculopathy, lumbar region: Secondary | ICD-10-CM | POA: Diagnosis not present

## 2017-07-15 DIAGNOSIS — R202 Paresthesia of skin: Secondary | ICD-10-CM | POA: Diagnosis not present

## 2017-07-18 ENCOUNTER — Other Ambulatory Visit: Payer: Self-pay

## 2017-07-18 DIAGNOSIS — R609 Edema, unspecified: Secondary | ICD-10-CM

## 2017-07-19 ENCOUNTER — Encounter: Payer: Self-pay | Admitting: Vascular Surgery

## 2017-07-19 ENCOUNTER — Ambulatory Visit (HOSPITAL_COMMUNITY)
Admission: RE | Admit: 2017-07-19 | Discharge: 2017-07-19 | Disposition: A | Discharge status: Home / Self Care | Payer: Medicare Other | Source: Ambulatory Visit | Attending: Vascular Surgery | Admitting: Vascular Surgery

## 2017-07-19 ENCOUNTER — Ambulatory Visit (INDEPENDENT_AMBULATORY_CARE_PROVIDER_SITE_OTHER): Payer: Medicare Other | Admitting: Vascular Surgery

## 2017-07-19 VITALS — BP 120/76 | HR 91 | Resp 18 | Ht 63.0 in | Wt 176.0 lb

## 2017-07-19 DIAGNOSIS — R069 Unspecified abnormalities of breathing: Secondary | ICD-10-CM | POA: Insufficient documentation

## 2017-07-19 DIAGNOSIS — M7989 Other specified soft tissue disorders: Secondary | ICD-10-CM | POA: Insufficient documentation

## 2017-07-19 DIAGNOSIS — R609 Edema, unspecified: Secondary | ICD-10-CM

## 2017-07-19 DIAGNOSIS — I83893 Varicose veins of bilateral lower extremities with other complications: Secondary | ICD-10-CM

## 2017-07-19 NOTE — Progress Notes (Signed)
Subjective:     Patient ID: Tammy Sandoval, female   DOB: Jul 05, 1955, 62 y.o.   MRN: [id number]  HPI This 62 year old female is known to me having undergone laser ablation of bilateral great saphenous veins with multiple stab phlebectomies bilaterally in 2013 with a good result.  She states recently she developed some swelling in the left and right ankle areas and 2 small nodules were palpable.  She does have "restless leg syndrome" and develops numbness on the top of both feet.  She has been told she has neuropathy.  She is concerned about her venous system.  She has no history of DVT thrombophlebitis stasis ulcers or bleeding.  Past Medical History:  Diagnosis Date  . Anemia   . Anxiety   . Arthritis   . Cancer [facility])    skin-surgically removed  . Crescendo transient ischemic attacks    pt has no idea about this, has never had TIA's or strokes  . Depression   . Dyspnea    with exertion  . Generalized headaches   . GERD (gastroesophageal reflux disease)   . Gestational diabetes   . Insomnia   . Irritable bowel syndrome    mostly constipation  . Leg pain   . Low back pain   . Obesity   . Palpitations   . Restless leg   . Seizures ([place]) 2013   only one she has ever had, placed on neurontin  . Thrombophlebitis   . Ulcer    RLE  . Varicose veins     Social History   Tobacco Use  . Smoking status: Never Smoker  . Smokeless tobacco: Never Used  Substance Use Topics  . Alcohol use: No    Family History  Problem Relation Age of Onset  . Cancer Mother        pancreatic and breast  . Deep vein thrombosis Mother   . Diabetes Mother   . Heart disease Mother   . Other Mother        varicose veing  . Heart disease Father   . Hyperlipidemia Father   . Hypertension Father   . Heart attack Father   . COPD Father   . AAA (abdominal aortic aneurysm) Father   . Diabetes Brother   . Heart attack Brother   . Heart disease Brother     Allergies  Allergen Reactions  .  Latex Swelling    SWELLING REACTION UNSPECIFIED  Redness,swelling  . Wellbutrin [Bupropion Hcl] Itching and Other (See Comments)    HEAD TINGLES INTOLERANCE: MADE PT. LOOPY     Current Outpatient Medications:  .  ARIPiprazole (ABILIFY) 2 MG tablet, Take 1 tablet (2 mg total) by mouth daily., Disp: 30 tablet, Rfl: 0 .  ARIPiprazole (ABILIFY) 5 MG tablet, Take 5 mg by mouth daily. , Disp: , Rfl: 0 .  baclofen (LIORESAL) 10 MG tablet, , Disp: , Rfl: 0 .  cyclobenzaprine (FLEXERIL) 10 MG tablet, , Disp: , Rfl: 0 .  diclofenac (VOLTAREN) 75 MG EC tablet, Take by mouth., Disp: , Rfl:  .  furosemide (LASIX) 40 MG tablet, Take 40 mg by mouth daily., Disp: , Rfl: 0 .  gabapentin (NEURONTIN) 600 MG tablet, Take 600 mg by mouth 3 (three) times daily., Disp: , Rfl: 0 .  hydrOXYzine (ATARAX/VISTARIL) 25 MG tablet, take 1 tablet by mouth twice a day, Disp: , Rfl:  .  omeprazole (PRILOSEC) 20 MG capsule, Take 1 capsule (20 mg total) by mouth 2 (two) times  daily before a meal., Disp: , Rfl:  .  oxyCODONE (OXY IR/ROXICODONE) 5 MG immediate release tablet, Take 1-2 tablets (5-10 mg total) by mouth every 3 (three) hours as needed for breakthrough pain., Disp: 60 tablet, Rfl: 0 .  trazodone (DESYREL) 300 MG tablet, Take 300 mg by mouth at bedtime. , Disp: , Rfl: 0 .  venlafaxine XR (EFFEXOR-XR) 150 MG 24 hr capsule, Take 1 capsule (150 mg total) by mouth daily., Disp: , Rfl:  .  apixaban (ELIQUIS) 2.5 MG TABS tablet, Take 1 tablet (2.5 mg total) by mouth every 12 (twelve) hours. (Patient not taking: Reported on 07/19/2017), Disp: 60 tablet, Rfl: 0 .  docusate sodium (COLACE) 100 MG capsule, Take 1 capsule (100 mg total) by mouth 2 (two) times daily. (Patient not taking: Reported on 07/19/2017), Disp: 10 capsule, Rfl: 0 .  linaclotide (LINZESS) 145 MCG CAPS capsule, Take 1 capsule (145 mcg total) by mouth daily. (Patient not taking: Reported on 07/19/2017), Disp: 30 capsule, Rfl:  .  methocarbamol (ROBAXIN) 750 MG  tablet, Take 750 mg by mouth every 6 (six) hours as needed for muscle spasms., Disp: , Rfl:  .  ondansetron (ZOFRAN) 4 MG tablet, Take 1 tablet (4 mg total) by mouth every 6 (six) hours as needed for nausea. (Patient not taking: Reported on 07/19/2017), Disp: 20 tablet, Rfl: 0 .  senna (SENOKOT) 8.6 MG TABS tablet, Take 2 tablets (17.2 mg total) by mouth at bedtime. (Patient not taking: Reported on 07/19/2017), Disp: 120 each, Rfl: 0  Vitals:   07/19/17 1041  BP: 120/76  Pulse: 91  Resp: 18  SpO2: 98%  Weight: 176 lb (79.8 kg)  Height: 5' 3"  (1.6 m)    Body mass index is 31.18 kg/m.         Review of Systems Denies chest pain, dyspnea on exertion, PND, orthopnea, hemoptysis, claudication    Objective:   Physical Exam BP 120/76 (BP Location: Right Arm, Patient Position: Sitting, Cuff Size: Normal)   Pulse 91   Resp 18   Ht 5' 3"  (1.6 m)   Wt 176 lb (79.8 kg)   SpO2 98%   BMI 31.18 kg/m     Gen.-alert and oriented x3 in no apparent distress HEENT normal for age Lungs no rhonchi or wheezing Cardiovascular regular rhythm no murmurs carotid pulses 3+ palpable no bruits audible Abdomen soft nontender no palpable masses Musculoskeletal free of  major deformities Skin clear -no rashes Neurologic normal Lower extremities 3+ femoral and dorsalis pedis pulses palpable bilaterally with minimal edema No bulging varicosities noted.  A subcutaneous nodule measuring about 4 mm in diameter is palpable in the left ankle area anteriorly with some bluish discoloration overlying this.  This could represent a ganglion cyst or a thrombosed small varicose vein Right ankle has a similar palpable nodule laterally near the ankle with no bluish discoloration.  No hyperpigmentation or ulceration is noted bilaterally  Today ordered a venous duplex exam of both legs which I reviewed and interpreted.  There is no DVT.  Both great saphenous veins are totally closed from previous ablation.  There is  some reflux and small caliber anterior accessory branch great saphenous veins bilaterally.  There is some reflux in the small saphenous vein on the left but the vein is not large caliber       Assessment:     6 years status post bilateral great saphenous vein ablations with multiple stab phlebectomy with excellent result Some residual edema bilaterally in the  ankles likely due to deep vein reflux which was present at the time of the previous procedures    Plan:     #1 elevate foot of bed 2-3 inches 2.  Short-leg elastic compression stockings 20-30 mm gradient to be placed on early in the morning #3 no need for any further vascular evaluation She has appointment with orthopedic surgeon in the near future

## 2017-07-20 DIAGNOSIS — E785 Hyperlipidemia, unspecified: Secondary | ICD-10-CM | POA: Diagnosis not present

## 2017-07-20 DIAGNOSIS — I739 Peripheral vascular disease, unspecified: Secondary | ICD-10-CM | POA: Diagnosis not present

## 2017-08-02 DIAGNOSIS — F411 Generalized anxiety disorder: Secondary | ICD-10-CM | POA: Diagnosis not present

## 2017-08-02 DIAGNOSIS — F331 Major depressive disorder, recurrent, moderate: Secondary | ICD-10-CM | POA: Diagnosis not present

## 2017-08-08 DIAGNOSIS — M47816 Spondylosis without myelopathy or radiculopathy, lumbar region: Secondary | ICD-10-CM | POA: Diagnosis not present

## 2017-08-15 DIAGNOSIS — R2243 Localized swelling, mass and lump, lower limb, bilateral: Secondary | ICD-10-CM | POA: Diagnosis not present

## 2017-08-15 DIAGNOSIS — M25572 Pain in left ankle and joints of left foot: Secondary | ICD-10-CM | POA: Diagnosis not present

## 2017-08-15 DIAGNOSIS — M25571 Pain in right ankle and joints of right foot: Secondary | ICD-10-CM | POA: Diagnosis not present

## 2017-08-22 DIAGNOSIS — M47816 Spondylosis without myelopathy or radiculopathy, lumbar region: Secondary | ICD-10-CM | POA: Diagnosis not present

## 2017-08-23 DIAGNOSIS — R2243 Localized swelling, mass and lump, lower limb, bilateral: Secondary | ICD-10-CM | POA: Diagnosis not present

## 2017-08-30 DIAGNOSIS — M25571 Pain in right ankle and joints of right foot: Secondary | ICD-10-CM | POA: Diagnosis not present

## 2017-08-31 DIAGNOSIS — G609 Hereditary and idiopathic neuropathy, unspecified: Secondary | ICD-10-CM | POA: Diagnosis not present

## 2017-09-05 DIAGNOSIS — F419 Anxiety disorder, unspecified: Secondary | ICD-10-CM | POA: Diagnosis not present

## 2017-09-05 DIAGNOSIS — G629 Polyneuropathy, unspecified: Secondary | ICD-10-CM | POA: Diagnosis not present

## 2017-09-07 DIAGNOSIS — F418 Other specified anxiety disorders: Secondary | ICD-10-CM | POA: Diagnosis not present

## 2017-09-07 DIAGNOSIS — G894 Chronic pain syndrome: Secondary | ICD-10-CM | POA: Diagnosis not present

## 2017-09-19 DIAGNOSIS — M545 Low back pain: Secondary | ICD-10-CM | POA: Diagnosis not present

## 2017-09-19 DIAGNOSIS — M47816 Spondylosis without myelopathy or radiculopathy, lumbar region: Secondary | ICD-10-CM | POA: Diagnosis not present

## 2017-09-20 ENCOUNTER — Telehealth: Payer: Self-pay | Admitting: Hematology

## 2017-09-20 ENCOUNTER — Encounter: Payer: Self-pay | Admitting: Hematology

## 2017-09-20 NOTE — Telephone Encounter (Signed)
Referral received from Dr. Audelia Acton for the pt to see Dr. Irene Limbo. Pt has been called and scheduled to see Dr. Irene Limbo on 6/19 at 10am. Pt's address has been verified. Letter mailed.

## 2017-10-04 DIAGNOSIS — G629 Polyneuropathy, unspecified: Secondary | ICD-10-CM | POA: Diagnosis not present

## 2017-10-04 DIAGNOSIS — F419 Anxiety disorder, unspecified: Secondary | ICD-10-CM | POA: Diagnosis not present

## 2017-10-04 NOTE — Progress Notes (Signed)
HEMATOLOGY/ONCOLOGY CONSULTATION NOTE  Date of Service: 10/05/2017  Patient Care Team: Monico Blitz, MD as PCP - General (Internal Medicine) System, Provider Not In as Referring Physician (General Practice)  CHIEF COMPLAINTS/PURPOSE OF CONSULTATION:  Polyclonal gammopathy  HISTORY OF PRESENTING ILLNESS:   Tammy Sandoval is a wonderful 62 y.o. female who has been referred to [place] by her PCP Dr Mariann Barter for evaluation and management of Polyclonal gammopathy. She is accompanied today by her daughter.  The pt reports that she is doing well overall. She sees Dr Harley Alto for pain management.   Prior to our visit, the pt saw Dr. Cherylann Banas at [facility] on 09/16/17 for follow up regarding her peripheral neuropathy. A serum immunofixation electrophoresis is reported to have shown an apparent polyclonal gammopathy with elevated IgA level.  She has seen [facility] for a little over a month and she has developed neuropathy over the past 3 months. She notes that she had shooting pains in her bilateral ankles and feet, and endorses historical back problems with pain radiating into her legs. She notes that her pain stays the same wether she is walking or sitting, and her left foot and ankle is worse. She adds that she is pre-diabetic, and denies back injury or back surgeries. She receives steroid injections every 6-8 months for her chronic back pain. She also notes that she has had worsening dry mouth.   She notes that she has had nerve conduction studies as part of her neurological work up, and describes that her recently developed neuropathy is being attributed to her pre-diabetes.   The pt reports that she does not feel any differently in the last 6 months than compared to a year ago. She denies any fevers, chills, night sweats, unexpected weight loss, and new fatigue.   She notes that she took Vitamin B12 replacement due to a deficiency, but was discontinued last  year after her levels became elevated past normal. She was taking PO Vitamin B12, denying the sublingual preparation, and has not taken this in 6 months.   Outside lab results have not yet been made available.   On review of systems, pt reports weight gain, constipation, dry mouth, and denies new back pains, bone pains, rib pains, headaches, red/swollen/painful joints, unexpected weight loss, new fatigue, abdominal pains, and any other symptoms.   On PMHx the pt reports pre-diabetes, IBS, chronic venous insufficiency, degenerative disc and joint disease, spine surgery. On Social Hx the pt denies much ETOH use.  MEDICAL HISTORY:  Past Medical History:  Diagnosis Date  . Anemia   . Anxiety   . Arthritis   . Cancer [facility])    skin-surgically removed  . Crescendo transient ischemic attacks    pt has no idea about this, has never had TIA's or strokes  . Depression   . Dyspnea    with exertion  . Generalized headaches   . GERD (gastroesophageal reflux disease)   . Gestational diabetes   . Insomnia   . Irritable bowel syndrome    mostly constipation  . Leg pain   . Low back pain   . Obesity   . Palpitations   . Restless leg   . Seizures ([place]) 2013   only one she has ever had, placed on neurontin  . Thrombophlebitis   . Ulcer    RLE  . Varicose veins     SURGICAL HISTORY: Past Surgical History:  Procedure Laterality Date  . APPENDECTOMY    .  BREAST LUMPECTOMY     bilateral  . CERVICAL DISC ARTHROPLASTY N/A 08/22/2015   Procedure: Cervical five - six Cervical six - seven arthroplasty;  Surgeon: Ashok Pall, MD;  Location: [facility];  Service: Neurosurgery;  Laterality: N/A;  C56 C67 arthroplasty  . CESAREAN SECTION    . COLONOSCOPY    . KNEE ARTHROPLASTY Right 02/23/2016   Procedure: COMPUTER ASSISTED TOTAL KNEE ARTHROPLASTY NAVIGATION;  Surgeon: Rod Can, MD;  Location: [facility];  Service: Orthopedics;  Laterality: Right;  . LEG SURGERY  2004   mass removed on right  leg  . skin cancer removal  2012 & 2013   2 places on face  . TOTAL KNEE ARTHROPLASTY Right 02/23/2015    SOCIAL HISTORY: Social History   Socioeconomic History  . Marital status: Divorced    Spouse name: Not on file  . Number of children: Not on file  . Years of education: Not on file  . Highest education level: Not on file  Occupational History  . Not on file  Social Needs  . Financial resource strain: Not on file  . Food insecurity:    Worry: Not on file    Inability: Not on file  . Transportation needs:    Medical: Not on file    Non-medical: Not on file  Tobacco Use  . Smoking status: Never Smoker  . Smokeless tobacco: Never Used  Substance and Sexual Activity  . Alcohol use: No  . Drug use: No  . Sexual activity: Not on file  Lifestyle  . Physical activity:    Days per week: Not on file    Minutes per session: Not on file  . Stress: Not on file  Relationships  . Social connections:    Talks on phone: Not on file    Gets together: Not on file    Attends religious service: Not on file    Active member of club or organization: Not on file    Attends meetings of clubs or organizations: Not on file    Relationship status: Not on file  . Intimate partner violence:    Fear of current or ex partner: Not on file    Emotionally abused: Not on file    Physically abused: Not on file    Forced sexual activity: Not on file  Other Topics Concern  . Not on file  Social History Narrative  . Not on file    FAMILY HISTORY: Family History  Problem Relation Age of Onset  . Cancer Mother        pancreatic and breast  . Deep vein thrombosis Mother   . Diabetes Mother   . Heart disease Mother   . Other Mother        varicose veing  . Heart disease Father   . Hyperlipidemia Father   . Hypertension Father   . Heart attack Father   . COPD Father   . AAA (abdominal aortic aneurysm) Father   . Diabetes Brother   . Heart attack Brother   . Heart disease Brother      ALLERGIES:  is allergic to latex and wellbutrin [bupropion hcl].  MEDICATIONS:  Current Outpatient Medications  Medication Sig Dispense Refill  . apixaban (ELIQUIS) 2.5 MG TABS tablet Take 1 tablet (2.5 mg total) by mouth every 12 (twelve) hours. (Patient not taking: Reported on 07/19/2017) 60 tablet 0  . ARIPiprazole (ABILIFY) 2 MG tablet Take 1 tablet (2 mg total) by mouth daily. 30 tablet 0  .  ARIPiprazole (ABILIFY) 5 MG tablet Take 5 mg by mouth daily.   0  . baclofen (LIORESAL) 10 MG tablet   0  . cyclobenzaprine (FLEXERIL) 10 MG tablet   0  . diclofenac (VOLTAREN) 75 MG EC tablet Take by mouth.    . docusate sodium (COLACE) 100 MG capsule Take 1 capsule (100 mg total) by mouth 2 (two) times daily. (Patient not taking: Reported on 07/19/2017) 10 capsule 0  . furosemide (LASIX) 40 MG tablet Take 40 mg by mouth daily.  0  . gabapentin (NEURONTIN) 600 MG tablet Take 600 mg by mouth 3 (three) times daily.  0  . hydrOXYzine (ATARAX/VISTARIL) 25 MG tablet take 1 tablet by mouth twice a day    . linaclotide (LINZESS) 145 MCG CAPS capsule Take 1 capsule (145 mcg total) by mouth daily. (Patient not taking: Reported on 07/19/2017) 30 capsule   . methocarbamol (ROBAXIN) 750 MG tablet Take 750 mg by mouth every 6 (six) hours as needed for muscle spasms.    Marland Kitchen omeprazole (PRILOSEC) 20 MG capsule Take 1 capsule (20 mg total) by mouth 2 (two) times daily before a meal.    . ondansetron (ZOFRAN) 4 MG tablet Take 1 tablet (4 mg total) by mouth every 6 (six) hours as needed for nausea. (Patient not taking: Reported on 07/19/2017) 20 tablet 0  . oxyCODONE (OXY IR/ROXICODONE) 5 MG immediate release tablet Take 1-2 tablets (5-10 mg total) by mouth every 3 (three) hours as needed for breakthrough pain. 60 tablet 0  . senna (SENOKOT) 8.6 MG TABS tablet Take 2 tablets (17.2 mg total) by mouth at bedtime. (Patient not taking: Reported on 07/19/2017) 120 each 0  . trazodone (DESYREL) 300 MG tablet Take 300 mg by mouth at  bedtime.   0  . venlafaxine XR (EFFEXOR-XR) 150 MG 24 hr capsule Take 1 capsule (150 mg total) by mouth daily.     No current facility-administered medications for this visit.     REVIEW OF SYSTEMS:    A 10+ POINT REVIEW OF SYSTEMS WAS OBTAINED including neurology, dermatology, psychiatry, cardiac, respiratory, lymph, extremities, GI, GU, Musculoskeletal, constitutional, breasts, reproductive, HEENT.  All pertinent positives are noted in the HPI.  All others are negative.    PHYSICAL EXAMINATION:  . Vitals:   10/05/17 0957  BP: 137/86  Pulse: 92  Resp: 18  Temp: 98.7 F (37.1 C)  SpO2: 98%   Filed Weights   10/05/17 0957  Weight: 170 lb 8 oz (77.3 kg)   .Body mass index is 30.2 kg/m.  GENERAL:alert, in no acute distress and comfortable SKIN: no acute rashes, no significant lesions EYES: conjunctiva are pink and non-injected, sclera anicteric OROPHARYNX: MMM, no exudates, no oropharyngeal erythema or ulceration NECK: supple, no JVD LYMPH:  no palpable lymphadenopathy in the cervical, axillary or inguinal regions LUNGS: clear to auscultation b/l with normal respiratory effort HEART: regular rate & rhythm ABDOMEN:  normoactive bowel sounds , non tender, not distended. Extremity: no pedal edema PSYCH: alert & oriented x 3 with fluent speech NEURO: no focal motor/sensory deficits   LABORATORY DATA:  I have reviewed the data as listed  . CBC Latest Ref Rng & Units 10/05/2017 02/24/2016 02/12/2016  WBC 3.9 - 10.3 K/uL 6.5 6.9 5.9  Hemoglobin 11.6 - 15.9 g/dL 13.9 9.5(L) 13.3  Hematocrit 34.8 - 46.6 % 41.9 29.5(L) 41.0  Platelets 145 - 400 K/uL 331 252 312    . CMP Latest Ref Rng & Units 10/05/2017 02/24/2016 02/12/2016  Glucose 70 - 140 mg/dL 89 135(H) 92  BUN 7 - 26 mg/dL 14 15 12   Creatinine 0.60 - 1.10 mg/dL 0.92 0.74 0.71  Sodium 136 - 145 mmol/L 144 138 139  Potassium 3.5 - 5.1 mmol/L 3.8 4.2 3.9  Chloride 98 - 109 mmol/L 106 108 105  CO2 22 - 29 mmol/L 30(H) 27  27  Calcium 8.4 - 10.4 mg/dL 10.0 7.8(L) 9.1  Total Protein 6.4 - 8.3 g/dL 7.6 - -  Total Bilirubin 0.2 - 1.2 mg/dL 0.3 - -  Alkaline Phos 40 - 150 U/L 69 - -  AST 5 - 34 U/L 21 - -  ALT 0 - 55 U/L 25 - -      Component     Latest Ref Rng & Units 10/05/2017  Sed Rate     0 - 22 mm/hr 9  LDH     125 - 245 U/L 223   RADIOGRAPHIC STUDIES: I have personally reviewed the radiological images as listed and agreed with the findings in the report. No results found.  ASSESSMENT & PLAN:   62 y.o. female with apparent neuropathy vs radiculopathy noted to reportedly have elevated IgA level with polyclonal gammopathy  1. Polyclonal gammopathy Plan -Pt has been referred for an abnormal protein (reported elevated IgA level and polyclonal gammopathy) picked up in lab work, which has been described as a polyclonal gammopathy -Outside labs have not yet been made available -labs were done today and show no anemia, renal insuff, hypercalcemia. -myeloma panel shows only borderline IgA elevation with no M spike and normal Serum free light ratio. -normal sed rate and LDH. -no evidence of monoclonal gammopathy, myeloma and low likelihood of other plasma cell dyscraisa -Whole body skeletal survey was ordered as was 24h UPEP --currently pending.   Labs today Skeletal survey in 1 week RTC with Dr Irene Limbo in 3 weeks     All of the patients questions were answered with apparent satisfaction. The patient knows to call the clinic with any problems, questions or concerns.  The toal time spent in the appt was 35 minutes and more than 50% was on counseling and direct patient cares.    Sullivan Lone MD MS AAHIVMS [facility] Hematology/Oncology Physician [facility]  (Office):       [phone] (Work cell):  [phone] (Fax):           [phone]  10/05/2017 10:05 AM  I, Baldwin [place], am acting as a Education administrator for Dr Irene Limbo.   .I have reviewed the above documentation for accuracy and  completeness, and I agree with the above. Brunetta Genera MD

## 2017-10-05 ENCOUNTER — Other Ambulatory Visit: Payer: Self-pay

## 2017-10-05 ENCOUNTER — Encounter: Payer: Self-pay | Admitting: Hematology

## 2017-10-05 ENCOUNTER — Telehealth: Payer: Self-pay | Admitting: Hematology

## 2017-10-05 ENCOUNTER — Inpatient Hospital Stay: Payer: Medicare Other

## 2017-10-05 ENCOUNTER — Inpatient Hospital Stay: Payer: Medicare Other | Attending: Hematology | Admitting: Hematology

## 2017-10-05 VITALS — BP 137/86 | HR 92 | Temp 98.7°F | Resp 18 | Ht 63.0 in | Wt 170.5 lb

## 2017-10-05 DIAGNOSIS — G620 Drug-induced polyneuropathy: Secondary | ICD-10-CM | POA: Diagnosis not present

## 2017-10-05 DIAGNOSIS — R7303 Prediabetes: Secondary | ICD-10-CM

## 2017-10-05 DIAGNOSIS — D890 Polyclonal hypergammaglobulinemia: Secondary | ICD-10-CM | POA: Diagnosis not present

## 2017-10-05 DIAGNOSIS — Z800 Family history of malignant neoplasm of digestive organs: Secondary | ICD-10-CM | POA: Diagnosis not present

## 2017-10-05 DIAGNOSIS — D472 Monoclonal gammopathy: Secondary | ICD-10-CM

## 2017-10-05 DIAGNOSIS — Z803 Family history of malignant neoplasm of breast: Secondary | ICD-10-CM

## 2017-10-05 LAB — CBC WITH DIFFERENTIAL/PLATELET
Basophils Absolute: 0 10*3/uL (ref 0.0–0.1)
Basophils Relative: 1 %
Eosinophils Absolute: 0.2 10*3/uL (ref 0.0–0.5)
Eosinophils Relative: 2 %
HCT: 41.9 % (ref 34.8–46.6)
Hemoglobin: 13.9 g/dL (ref 11.6–15.9)
Lymphocytes Relative: 23 %
Lymphs Abs: 1.5 10*3/uL (ref 0.9–3.3)
MCH: 30 pg (ref 25.1–34.0)
MCHC: 33.2 g/dL (ref 31.5–36.0)
MCV: 90.4 fL (ref 79.5–101.0)
Monocytes Absolute: 0.3 10*3/uL (ref 0.1–0.9)
Monocytes Relative: 4 %
Neutro Abs: 4.6 10*3/uL (ref 1.5–6.5)
Neutrophils Relative %: 70 %
Platelets: 331 10*3/uL (ref 145–400)
RBC: 4.64 MIL/uL (ref 3.70–5.45)
RDW: 14.5 % (ref 11.2–14.5)
WBC: 6.5 10*3/uL (ref 3.9–10.3)

## 2017-10-05 LAB — CMP (CANCER CENTER ONLY)
ALT: 25 U/L (ref 0–55)
AST: 21 U/L (ref 5–34)
Albumin: 4.3 g/dL (ref 3.5–5.0)
Alkaline Phosphatase: 69 U/L (ref 40–150)
Anion gap: 8 (ref 3–11)
BUN: 14 mg/dL (ref 7–26)
CO2: 30 mmol/L — ABNORMAL HIGH (ref 22–29)
Calcium: 10 mg/dL (ref 8.4–10.4)
Chloride: 106 mmol/L (ref 98–109)
Creatinine: 0.92 mg/dL (ref 0.60–1.10)
GFR, Est AFR Am: >60 mL/min (ref >60)
GFR, Estimated: >60 mL/min (ref >60)
Glucose, Bld: 89 mg/dL (ref 70–140)
Potassium: 3.8 mmol/L (ref 3.5–5.1)
Sodium: 144 mmol/L (ref 136–145)
Total Bilirubin: 0.3 mg/dL (ref 0.2–1.2)
Total Protein: 7.6 g/dL (ref 6.4–8.3)

## 2017-10-05 LAB — SEDIMENTATION RATE: Sed Rate: 9 mm/hr (ref 0–22)

## 2017-10-05 LAB — LACTATE DEHYDROGENASE: LDH: 223 U/L (ref 125–245)

## 2017-10-05 NOTE — Telephone Encounter (Signed)
Gave pt avs and calendar with appts per 6/19 los.

## 2017-10-06 LAB — KAPPA/LAMBDA LIGHT CHAINS
Kappa free light chain: 24 mg/L — ABNORMAL HIGH (ref 3.3–19.4)
Kappa, lambda light chain ratio: 1.35 (ref 0.26–1.65)
Lambda free light chains: 17.8 mg/L (ref 5.7–26.3)

## 2017-10-07 DIAGNOSIS — Z1389 Encounter for screening for other disorder: Secondary | ICD-10-CM | POA: Diagnosis not present

## 2017-10-07 DIAGNOSIS — G629 Polyneuropathy, unspecified: Secondary | ICD-10-CM | POA: Diagnosis not present

## 2017-10-07 DIAGNOSIS — R7303 Prediabetes: Secondary | ICD-10-CM | POA: Diagnosis not present

## 2017-10-07 DIAGNOSIS — Z136 Encounter for screening for cardiovascular disorders: Secondary | ICD-10-CM | POA: Diagnosis not present

## 2017-10-07 DIAGNOSIS — G4700 Insomnia, unspecified: Secondary | ICD-10-CM | POA: Diagnosis not present

## 2017-10-07 DIAGNOSIS — R609 Edema, unspecified: Secondary | ICD-10-CM | POA: Diagnosis not present

## 2017-10-07 DIAGNOSIS — K589 Irritable bowel syndrome without diarrhea: Secondary | ICD-10-CM | POA: Diagnosis not present

## 2017-10-07 DIAGNOSIS — E669 Obesity, unspecified: Secondary | ICD-10-CM | POA: Diagnosis not present

## 2017-10-07 DIAGNOSIS — Z131 Encounter for screening for diabetes mellitus: Secondary | ICD-10-CM | POA: Diagnosis not present

## 2017-10-07 DIAGNOSIS — D509 Iron deficiency anemia, unspecified: Secondary | ICD-10-CM | POA: Diagnosis not present

## 2017-10-07 DIAGNOSIS — Z01118 Encounter for examination of ears and hearing with other abnormal findings: Secondary | ICD-10-CM | POA: Diagnosis not present

## 2017-10-07 DIAGNOSIS — F418 Other specified anxiety disorders: Secondary | ICD-10-CM | POA: Diagnosis not present

## 2017-10-07 DIAGNOSIS — K219 Gastro-esophageal reflux disease without esophagitis: Secondary | ICD-10-CM | POA: Diagnosis not present

## 2017-10-07 DIAGNOSIS — E785 Hyperlipidemia, unspecified: Secondary | ICD-10-CM | POA: Diagnosis not present

## 2017-10-07 DIAGNOSIS — Z113 Encounter for screening for infections with a predominantly sexual mode of transmission: Secondary | ICD-10-CM | POA: Diagnosis not present

## 2017-10-07 LAB — MULTIPLE MYELOMA PANEL, SERUM
Albumin SerPl Elph-Mcnc: 3.9 g/dL (ref 2.9–4.4)
Albumin/Glob SerPl: 1.3 (ref 0.7–1.7)
Alpha 1: 0.3 g/dL (ref 0.0–0.4)
Alpha2 Glob SerPl Elph-Mcnc: 0.7 g/dL (ref 0.4–1.0)
B-Globulin SerPl Elph-Mcnc: 1.2 g/dL (ref 0.7–1.3)
Gamma Glob SerPl Elph-Mcnc: 1.1 g/dL (ref 0.4–1.8)
Globulin, Total: 3.2 g/dL (ref 2.2–3.9)
IgA: 385 mg/dL — ABNORMAL HIGH (ref 87–352)
IgG (Immunoglobin G), Serum: 1162 mg/dL (ref 700–1600)
IgM (Immunoglobulin M), Srm: 82 mg/dL (ref 26–217)
Total Protein ELP: 7.1 g/dL (ref 6.0–8.5)

## 2017-10-17 ENCOUNTER — Ambulatory Visit (HOSPITAL_COMMUNITY)
Admission: RE | Admit: 2017-10-17 | Discharge: 2017-10-17 | Disposition: A | Discharge status: Home / Self Care | Payer: Medicare Other | Source: Ambulatory Visit | Attending: Hematology | Admitting: Hematology

## 2017-10-17 DIAGNOSIS — D890 Polyclonal hypergammaglobulinemia: Secondary | ICD-10-CM | POA: Insufficient documentation

## 2017-10-17 DIAGNOSIS — D472 Monoclonal gammopathy: Secondary | ICD-10-CM | POA: Diagnosis not present

## 2017-10-24 DIAGNOSIS — G629 Polyneuropathy, unspecified: Secondary | ICD-10-CM | POA: Diagnosis not present

## 2017-10-24 DIAGNOSIS — E785 Hyperlipidemia, unspecified: Secondary | ICD-10-CM | POA: Diagnosis not present

## 2017-10-24 DIAGNOSIS — E669 Obesity, unspecified: Secondary | ICD-10-CM | POA: Diagnosis not present

## 2017-10-24 DIAGNOSIS — R7303 Prediabetes: Secondary | ICD-10-CM | POA: Diagnosis not present

## 2017-10-24 DIAGNOSIS — K589 Irritable bowel syndrome without diarrhea: Secondary | ICD-10-CM | POA: Diagnosis not present

## 2017-10-24 DIAGNOSIS — Z0000 Encounter for general adult medical examination without abnormal findings: Secondary | ICD-10-CM | POA: Diagnosis not present

## 2017-10-24 DIAGNOSIS — F418 Other specified anxiety disorders: Secondary | ICD-10-CM | POA: Diagnosis not present

## 2017-10-24 DIAGNOSIS — K219 Gastro-esophageal reflux disease without esophagitis: Secondary | ICD-10-CM | POA: Diagnosis not present

## 2017-10-24 DIAGNOSIS — G4700 Insomnia, unspecified: Secondary | ICD-10-CM | POA: Diagnosis not present

## 2017-10-24 DIAGNOSIS — D509 Iron deficiency anemia, unspecified: Secondary | ICD-10-CM | POA: Diagnosis not present

## 2017-10-25 DIAGNOSIS — Z1231 Encounter for screening mammogram for malignant neoplasm of breast: Secondary | ICD-10-CM | POA: Diagnosis not present

## 2017-10-25 DIAGNOSIS — Z803 Family history of malignant neoplasm of breast: Secondary | ICD-10-CM | POA: Diagnosis not present

## 2017-10-26 DIAGNOSIS — F411 Generalized anxiety disorder: Secondary | ICD-10-CM | POA: Diagnosis not present

## 2017-10-26 DIAGNOSIS — F331 Major depressive disorder, recurrent, moderate: Secondary | ICD-10-CM | POA: Diagnosis not present

## 2017-10-27 NOTE — Progress Notes (Signed)
HEMATOLOGY/ONCOLOGY CONSULTATION NOTE  Date of Service: 10/28/2017  Patient Care Team: Monico Blitz, MD as PCP - General (Internal Medicine) System, Provider Not In as Referring Physician (General Practice)  CHIEF COMPLAINTS/PURPOSE OF CONSULTATION:  Polyclonal gammopathy  HISTORY OF PRESENTING ILLNESS:   Tammy Sandoval is a wonderful 62 y.o. female who has been referred to [place] by her PCP Dr Mariann Barter for evaluation and management of Polyclonal gammopathy. She is accompanied today by her daughter.  The pt reports that she is doing well overall. She sees Dr Harley Alto for pain management.   Prior to our visit, the pt saw Dr. Cherylann Banas at [facility] on 09/16/17 for follow up regarding her peripheral neuropathy. A serum immunofixation electrophoresis is reported to have shown an apparent polyclonal gammopathy with elevated IgA level.  She has seen [facility] for a little over a month and she has developed neuropathy over the past 3 months. She notes that she had shooting pains in her bilateral ankles and feet, and endorses historical back problems with pain radiating into her legs. She notes that her pain stays the same wether she is walking or sitting, and her left foot and ankle is worse. She adds that she is pre-diabetic, and denies back injury or back surgeries. She receives steroid injections every 6-8 months for her chronic back pain. She also notes that she has had worsening dry mouth.   She notes that she has had nerve conduction studies as part of her neurological work up, and describes that her recently developed neuropathy is being attributed to her pre-diabetes.   The pt reports that she does not feel any differently in the last 6 months than compared to a year ago. She denies any fevers, chills, night sweats, unexpected weight loss, and new fatigue.   She notes that she took Vitamin B12 replacement due to a deficiency, but was discontinued last  year after her levels became elevated past normal. She was taking PO Vitamin B12, denying the sublingual preparation, and has not taken this in 6 months.   Outside lab results have not yet been made available.   On review of systems, pt reports weight gain, constipation, dry mouth, and denies new back pains, bone pains, rib pains, headaches, red/swollen/painful joints, unexpected weight loss, new fatigue, abdominal pains, and any other symptoms.   On PMHx the pt reports pre-diabetes, IBS, chronic venous insufficiency, degenerative disc and joint disease, spine surgery. On Social Hx the pt denies much ETOH use.  Interval History:   Tammy Sandoval returns today regarding her gammopathy with multiple M spikes. The patient's last visit with [place] was on 10/05/17. The pt reports that she is doing well overall.   The pt reports that she forgot to collect her 24 hour urine study. She is happy to report that she has no new concerns or symptoms.   Of note since the patient's last visit, pt has had Bone Survey completed on 10/17/17 with results revealing No radiographic evidence of multiple myeloma.  Lab results (10/05/17) of CBC w/diff, CMP is as follows: all values are WNL except for CO2 at 30. Sed Rate WNL at 9 LDH WNL at 223 Kappa/Lambda 10/05/17 revealed Kappa slightly elevated at 24.0, K:L ratio WNL at 1.35. SPEP 10/05/17 revealed IgA slightly elevated at 385, with all other values WNL including an unobservable M spike  On review of systems, pt reports, stable neuropathy, good energy levels, and denies bone pains, and any other  symptoms.   MEDICAL HISTORY:  Past Medical History:  Diagnosis Date  . Anemia   . Anxiety   . Arthritis   . Cancer [facility])    skin-surgically removed  . Crescendo transient ischemic attacks    pt has no idea about this, has never had TIA's or strokes  . Depression   . Dyspnea    with exertion  . Generalized headaches   . GERD (gastroesophageal reflux disease)   .  Gestational diabetes   . Insomnia   . Irritable bowel syndrome    mostly constipation  . Leg pain   . Low back pain   . Obesity   . Palpitations   . Restless leg   . Seizures ([place]) 2013   only one she has ever had, placed on neurontin  . Thrombophlebitis   . Ulcer    RLE  . Varicose veins     SURGICAL HISTORY: Past Surgical History:  Procedure Laterality Date  . APPENDECTOMY    . BREAST LUMPECTOMY     bilateral  . CERVICAL DISC ARTHROPLASTY N/A 08/22/2015   Procedure: Cervical five - six Cervical six - seven arthroplasty;  Surgeon: Ashok Pall, MD;  Location: [place] [facility];  Service: Neurosurgery;  Laterality: N/A;  C56 C67 arthroplasty  . CESAREAN SECTION    . COLONOSCOPY    . KNEE ARTHROPLASTY Right 02/23/2016   Procedure: COMPUTER ASSISTED TOTAL KNEE ARTHROPLASTY NAVIGATION;  Surgeon: Rod Can, MD;  Location: [place];  Service: Orthopedics;  Laterality: Right;  . LEG SURGERY  2004   mass removed on right leg  . skin cancer removal  2012 & 2013   2 places on face  . TOTAL KNEE ARTHROPLASTY Right 02/23/2015    SOCIAL HISTORY: Social History   Socioeconomic History  . Marital status: Divorced    Spouse name: Not on file  . Number of children: Not on file  . Years of education: Not on file  . Highest education level: Not on file  Occupational History  . Not on file  Social Needs  . Financial resource strain: Not on file  . Food insecurity:    Worry: Not on file    Inability: Not on file  . Transportation needs:    Medical: Not on file    Non-medical: Not on file  Tobacco Use  . Smoking status: Never Smoker  . Smokeless tobacco: Never Used  Substance and Sexual Activity  . Alcohol use: No  . Drug use: No  . Sexual activity: Not on file  Lifestyle  . Physical activity:    Days per week: Not on file    Minutes per session: Not on file  . Stress: Not on file  Relationships  . Social connections:    Talks on phone: Not on file    Gets together: Not on  file    Attends religious service: Not on file    Active member of club or organization: Not on file    Attends meetings of clubs or organizations: Not on file    Relationship status: Not on file  . Intimate partner violence:    Fear of current or ex partner: Not on file    Emotionally abused: Not on file    Physically abused: Not on file    Forced sexual activity: Not on file  Other Topics Concern  . Not on file  Social History Narrative  . Not on file    FAMILY HISTORY: Family History  Problem Relation  Age of Onset  . Cancer Mother        pancreatic and breast  . Deep vein thrombosis Mother   . Diabetes Mother   . Heart disease Mother   . Other Mother        varicose veing  . Heart disease Father   . Hyperlipidemia Father   . Hypertension Father   . Heart attack Father   . COPD Father   . AAA (abdominal aortic aneurysm) Father   . Diabetes Brother   . Heart attack Brother   . Heart disease Brother     ALLERGIES:  is allergic to latex and wellbutrin [bupropion hcl].  MEDICATIONS:  Current Outpatient Medications  Medication Sig Dispense Refill  . apixaban (ELIQUIS) 2.5 MG TABS tablet Take 1 tablet (2.5 mg total) by mouth every 12 (twelve) hours. (Patient not taking: Reported on 07/19/2017) 60 tablet 0  . ARIPiprazole (ABILIFY) 2 MG tablet Take 1 tablet (2 mg total) by mouth daily. 30 tablet 0  . ARIPiprazole (ABILIFY) 5 MG tablet Take 5 mg by mouth daily.   0  . baclofen (LIORESAL) 10 MG tablet   0  . cyclobenzaprine (FLEXERIL) 10 MG tablet   0  . diclofenac (VOLTAREN) 75 MG EC tablet Take by mouth.    . furosemide (LASIX) 40 MG tablet Take 40 mg by mouth daily.  0  . gabapentin (NEURONTIN) 600 MG tablet Take 600 mg by mouth 3 (three) times daily.  0  . hydrOXYzine (ATARAX/VISTARIL) 25 MG tablet take 1 tablet by mouth twice a day    . linaclotide (LINZESS) 145 MCG CAPS capsule Take 1 capsule (145 mcg total) by mouth daily. (Patient not taking: Reported on 07/19/2017) 30  capsule   . methocarbamol (ROBAXIN) 750 MG tablet Take 750 mg by mouth every 6 (six) hours as needed for muscle spasms.    Marland Kitchen omeprazole (PRILOSEC) 20 MG capsule Take 1 capsule (20 mg total) by mouth 2 (two) times daily before a meal.    . oxyCODONE (OXY IR/ROXICODONE) 5 MG immediate release tablet Take 1-2 tablets (5-10 mg total) by mouth every 3 (three) hours as needed for breakthrough pain. 60 tablet 0  . senna (SENOKOT) 8.6 MG TABS tablet Take 2 tablets (17.2 mg total) by mouth at bedtime. (Patient not taking: Reported on 07/19/2017) 120 each 0  . trazodone (DESYREL) 300 MG tablet Take 300 mg by mouth at bedtime.   0  . venlafaxine XR (EFFEXOR-XR) 150 MG 24 hr capsule Take 1 capsule (150 mg total) by mouth daily.     No current facility-administered medications for this visit.     REVIEW OF SYSTEMS:    A 10+ POINT REVIEW OF SYSTEMS WAS OBTAINED including neurology, dermatology, psychiatry, cardiac, respiratory, lymph, extremities, GI, GU, Musculoskeletal, constitutional, breasts, reproductive, HEENT.  All pertinent positives are noted in the HPI.  All others are negative.   PHYSICAL EXAMINATION:  . Vitals:   10/28/17 1021  BP: 131/78  Pulse: 92  Resp: 18  Temp: 98.4 F (36.9 C)  SpO2: 96%   Filed Weights   10/28/17 1021  Weight: 169 lb 11.2 oz (77 kg)   .Body mass index is 30.06 kg/m.  GENERAL:alert, in no acute distress and comfortable SKIN: no acute rashes, no significant lesions EYES: conjunctiva are pink and non-injected, sclera anicteric OROPHARYNX: MMM, no exudates, no oropharyngeal erythema or ulceration NECK: supple, no JVD LYMPH:  no palpable lymphadenopathy in the cervical, axillary or inguinal regions LUNGS: clear to  auscultation b/l with normal respiratory effort HEART: regular rate & rhythm ABDOMEN:  normoactive bowel sounds , non tender, not distended. No palpable hepatosplenomegaly.  Extremity: no pedal edema PSYCH: alert & oriented x 3 with fluent  speech NEURO: no focal motor/sensory deficits   LABORATORY DATA:  I have reviewed the data as listed  . CBC Latest Ref Rng & Units 10/05/2017 02/24/2016 02/12/2016  WBC 3.9 - 10.3 K/uL 6.5 6.9 5.9  Hemoglobin 11.6 - 15.9 g/dL 13.9 9.5(L) 13.3  Hematocrit 34.8 - 46.6 % 41.9 29.5(L) 41.0  Platelets 145 - 400 K/uL 331 252 312    . CMP Latest Ref Rng & Units 10/05/2017 02/24/2016 02/12/2016  Glucose 70 - 140 mg/dL 89 135(H) 92  BUN 7 - 26 mg/dL _0 Creatinine 0.60 - 1.10 mg/dL 0.92 0.74 0.71  Sodium 136 - 145 mmol/L 144 138 139  Potassium 3.5 - 5.1 mmol/L 3.8 4.2 3.9  Chloride 98 - 109 mmol/L 106 108 105  CO2 22 - 29 mmol/L 30(H) 27 27  Calcium 8.4 - 10.4 mg/dL 10.0 7.8(L) 9.1  Total Protein 6.4 - 8.3 g/dL 7.6 - -  Total Bilirubin 0.2 - 1.2 mg/dL 0.3 - -  Alkaline Phos 40 - 150 U/L 69 - -  AST 5 - 34 U/L 21 - -  ALT 0 - 55 U/L 25 - -      Component     Latest Ref Rng & Units 10/05/2017  Sed Rate     0 - 22 mm/hr 9  LDH     125 - 245 U/L 223   RADIOGRAPHIC STUDIES: I have personally reviewed the radiological images as listed and agreed with the findings in the report. Dg Bone Survey Met  Result Date: 10/17/2017 CLINICAL DATA:  Myeloma, gammopathy with multiple M spikes EXAM: METASTATIC BONE SURVEY COMPARISON:  None FINDINGS: Prior cervical spine fusion with disc prostheses at C5-C6 and C6-C7. Bones appear diffusely demineralized. Degenerative disc disease changes with disc space narrowing and endplate spurring at Z0-C5. Prior RIGHT total knee arthroplasty. No lytic or destructive bone lesions are identified to suggest multiple myeloma. Normal heart size and mediastinal contours. Lungs clear. IMPRESSION: No radiographic evidence of multiple myeloma. Electronically Signed   By: Lavonia Dana M.D.   On: 10/17/2017 16:38    ASSESSMENT & PLAN:   62 y.o. female with apparent neuropathy vs radiculopathy noted to reportedly have elevated IgA level with polyclonal gammopathy  1.  Polyclonal gammopathy Plan: -Pt has been referred for an abnormal protein (reported elevated IgA level and polyclonal gammopathy) picked up in lab work, which has been described as a polyclonal gammopathy -no evidence of monoclonal gammopathy, myeloma and low likelihood of other plasma cell dyscraisa -Discussed pt labwork from 10/05/17; IgA slightly elevated at 385. Sed rate, LDH, blood counts all normal. Total Protein level has normalized. No renal insuff, no hypercalcemia. -Discussed 10/17/17 Bone Survey which revealed No radiographic evidence of multiple myeloma.  -No indication of plasma cell disorder -No evidence of multiple myeloma clinically, with imaging, or labs -Will share note with PCP Dr. Vista Lawman - will be happy to se pt again if any new concerns arise    RTC with Dr Irene Limbo as needed if any questions or concerns arise    All of the patients questions were answered with apparent satisfaction. The patient knows to call the clinic with any problems, questions or concerns.  The total time spent in the appt was 15 minutes and more  than 50% was on counseling and direct patient cares.    Sullivan Lone MD MS AAHIVMS [facility] Hematology/Oncology Physician [facility]  (Office):       [phone] (Work cell):  [phone] (Fax):           [phone]  10/28/2017 10:46 AM  I, Baldwin [place], am acting as a Education administrator for Dr Irene Limbo.   .I have reviewed the above documentation for accuracy and completeness, and I agree with the above. Brunetta Genera MD

## 2017-10-28 ENCOUNTER — Telehealth: Payer: Self-pay

## 2017-10-28 ENCOUNTER — Inpatient Hospital Stay: Payer: Medicare Other | Attending: Hematology | Admitting: Hematology

## 2017-10-28 ENCOUNTER — Encounter: Payer: Self-pay | Admitting: Hematology

## 2017-10-28 VITALS — BP 131/78 | HR 92 | Temp 98.4°F | Resp 18 | Ht 63.0 in | Wt 169.7 lb

## 2017-10-28 DIAGNOSIS — D890 Polyclonal hypergammaglobulinemia: Secondary | ICD-10-CM

## 2017-10-28 DIAGNOSIS — D472 Monoclonal gammopathy: Secondary | ICD-10-CM

## 2017-10-28 NOTE — Telephone Encounter (Signed)
RTC with Dr Irene Limbo as needed if any questions or concerns arise. Per 7/12 no los

## 2017-11-01 DIAGNOSIS — G894 Chronic pain syndrome: Secondary | ICD-10-CM | POA: Diagnosis not present

## 2017-11-01 DIAGNOSIS — M47816 Spondylosis without myelopathy or radiculopathy, lumbar region: Secondary | ICD-10-CM | POA: Diagnosis not present

## 2017-11-01 DIAGNOSIS — M7918 Myalgia, other site: Secondary | ICD-10-CM | POA: Diagnosis not present

## 2017-11-01 DIAGNOSIS — M545 Low back pain: Secondary | ICD-10-CM | POA: Diagnosis not present

## 2017-11-16 DIAGNOSIS — F419 Anxiety disorder, unspecified: Secondary | ICD-10-CM | POA: Diagnosis not present

## 2017-11-16 DIAGNOSIS — G629 Polyneuropathy, unspecified: Secondary | ICD-10-CM | POA: Diagnosis not present

## 2017-12-28 DIAGNOSIS — M1712 Unilateral primary osteoarthritis, left knee: Secondary | ICD-10-CM | POA: Diagnosis not present

## 2018-01-12 DIAGNOSIS — G629 Polyneuropathy, unspecified: Secondary | ICD-10-CM | POA: Diagnosis not present

## 2018-01-12 DIAGNOSIS — F419 Anxiety disorder, unspecified: Secondary | ICD-10-CM | POA: Diagnosis not present

## 2018-01-24 DIAGNOSIS — F411 Generalized anxiety disorder: Secondary | ICD-10-CM | POA: Diagnosis not present

## 2018-01-24 DIAGNOSIS — F331 Major depressive disorder, recurrent, moderate: Secondary | ICD-10-CM | POA: Diagnosis not present

## 2018-01-26 DIAGNOSIS — R7303 Prediabetes: Secondary | ICD-10-CM | POA: Diagnosis not present

## 2018-01-26 DIAGNOSIS — E669 Obesity, unspecified: Secondary | ICD-10-CM | POA: Diagnosis not present

## 2018-01-26 DIAGNOSIS — G629 Polyneuropathy, unspecified: Secondary | ICD-10-CM | POA: Diagnosis not present

## 2018-01-26 DIAGNOSIS — R739 Hyperglycemia, unspecified: Secondary | ICD-10-CM | POA: Diagnosis not present

## 2018-01-26 DIAGNOSIS — D509 Iron deficiency anemia, unspecified: Secondary | ICD-10-CM | POA: Diagnosis not present

## 2018-01-26 DIAGNOSIS — K589 Irritable bowel syndrome without diarrhea: Secondary | ICD-10-CM | POA: Diagnosis not present

## 2018-01-26 DIAGNOSIS — F418 Other specified anxiety disorders: Secondary | ICD-10-CM | POA: Diagnosis not present

## 2018-01-26 DIAGNOSIS — K219 Gastro-esophageal reflux disease without esophagitis: Secondary | ICD-10-CM | POA: Diagnosis not present

## 2018-01-26 DIAGNOSIS — Z23 Encounter for immunization: Secondary | ICD-10-CM | POA: Diagnosis not present

## 2018-01-27 DIAGNOSIS — M1712 Unilateral primary osteoarthritis, left knee: Secondary | ICD-10-CM | POA: Diagnosis not present

## 2018-01-31 DIAGNOSIS — Z01811 Encounter for preprocedural respiratory examination: Secondary | ICD-10-CM | POA: Diagnosis not present

## 2018-01-31 DIAGNOSIS — R7303 Prediabetes: Secondary | ICD-10-CM | POA: Diagnosis not present

## 2018-01-31 DIAGNOSIS — Z01810 Encounter for preprocedural cardiovascular examination: Secondary | ICD-10-CM | POA: Diagnosis not present

## 2018-02-01 ENCOUNTER — Ambulatory Visit: Payer: Self-pay | Admitting: Orthopedic Surgery

## 2018-02-01 DIAGNOSIS — M47816 Spondylosis without myelopathy or radiculopathy, lumbar region: Secondary | ICD-10-CM | POA: Diagnosis not present

## 2018-02-01 DIAGNOSIS — M47892 Other spondylosis, cervical region: Secondary | ICD-10-CM | POA: Diagnosis not present

## 2018-02-01 DIAGNOSIS — G894 Chronic pain syndrome: Secondary | ICD-10-CM | POA: Diagnosis not present

## 2018-02-01 DIAGNOSIS — M47812 Spondylosis without myelopathy or radiculopathy, cervical region: Secondary | ICD-10-CM | POA: Diagnosis not present

## 2018-02-16 DIAGNOSIS — M1712 Unilateral primary osteoarthritis, left knee: Secondary | ICD-10-CM | POA: Diagnosis not present

## 2018-02-20 ENCOUNTER — Ambulatory Visit: Payer: Self-pay | Admitting: Orthopedic Surgery

## 2018-02-20 NOTE — H&P (View-Only) (Signed)
TOTAL KNEE ADMISSION H&P  Patient is being admitted for left total knee arthroplasty.  Subjective:  Chief Complaint:left knee pain.  HPI: Tammy Sandoval, 62 y.o. female, has a history of pain and functional disability in the left knee due to arthritis and has failed non-surgical conservative treatments for greater than 12 weeks to includeNSAID's and/or analgesics, corticosteriod injections, viscosupplementation injections, flexibility and strengthening excercises, use of assistive devices, weight reduction as appropriate and activity modification.  Onset of symptoms was gradual, starting 2 years ago with rapidlly worsening course since that time. The patient noted no past surgery on the left knee(s).  Patient currently rates pain in the left knee(s) at 10 out of 10 with activity. Patient has night pain, worsening of pain with activity and weight bearing, pain that interferes with activities of daily living, pain with passive range of motion, crepitus and joint swelling.  Patient has evidence of subchondral cysts, subchondral sclerosis, periarticular osteophytes and joint space narrowing by imaging studies. There is no active infection.  Patient Active Problem List   Diagnosis Date Noted  . Osteoarthritis of right knee 02/23/2016  . Major depression 09/23/2015  . Osteoarthritis of spine with radiculopathy, cervical region 08/22/2015  . Varicose veins of bilateral lower extremities with other complications 01/75/1025  . Chronic venous insufficiency 08/13/2011   Past Medical History:  Diagnosis Date  . Anemia   . Anxiety   . Arthritis   . Cancer [facility])    skin-surgically removed  . Crescendo transient ischemic attacks    pt has no idea about this, has never had TIA's or strokes  . Depression   . Dyspnea    with exertion  . Generalized headaches   . GERD (gastroesophageal reflux disease)   . Gestational diabetes   . Insomnia   . Irritable bowel syndrome    mostly constipation  . Leg  pain   . Low back pain   . Obesity   . Palpitations   . Restless leg   . Seizures ([place]) 2013   only one she has ever had, placed on neurontin  . Thrombophlebitis   . Ulcer    RLE  . Varicose veins     Past Surgical History:  Procedure Laterality Date  . APPENDECTOMY    . BREAST LUMPECTOMY     bilateral  . CERVICAL DISC ARTHROPLASTY N/A 08/22/2015   Procedure: Cervical five - six Cervical six - seven arthroplasty;  Surgeon: Ashok Pall, MD;  Location: [place] [facility];  Service: Neurosurgery;  Laterality: N/A;  C56 C67 arthroplasty  . CESAREAN SECTION    . COLONOSCOPY    . KNEE ARTHROPLASTY Right 02/23/2016   Procedure: COMPUTER ASSISTED TOTAL KNEE ARTHROPLASTY NAVIGATION;  Surgeon: Rod Can, MD;  Location: [place];  Service: Orthopedics;  Laterality: Right;  . LEG SURGERY  2004   mass removed on right leg  . skin cancer removal  2012 & 2013   2 places on face  . TOTAL KNEE ARTHROPLASTY Right 02/23/2015    Current Outpatient Medications  Medication Sig Dispense Refill Last Dose  . apixaban (ELIQUIS) 2.5 MG TABS tablet Take 1 tablet (2.5 mg total) by mouth every 12 (twelve) hours. (Patient not taking: Reported on 07/19/2017) 60 tablet 0 Not Taking  . ARIPiprazole (ABILIFY) 2 MG tablet Take 1 tablet (2 mg total) by mouth daily. 30 tablet 0 Taking  . ARIPiprazole (ABILIFY) 5 MG tablet Take 5 mg by mouth daily.   0 Taking  . baclofen (LIORESAL) 10 MG  tablet   0 Taking  . cyclobenzaprine (FLEXERIL) 10 MG tablet   0 Taking  . diclofenac (VOLTAREN) 75 MG EC tablet Take by mouth.   Taking  . furosemide (LASIX) 40 MG tablet Take 40 mg by mouth daily.  0 Taking  . gabapentin (NEURONTIN) 600 MG tablet Take 600 mg by mouth 3 (three) times daily.  0 Taking  . hydrOXYzine (ATARAX/VISTARIL) 25 MG tablet take 1 tablet by mouth twice a day   Taking  . linaclotide (LINZESS) 145 MCG CAPS capsule Take 1 capsule (145 mcg total) by mouth daily. (Patient not taking: Reported on 07/19/2017) 30 capsule  Not  Taking  . methocarbamol (ROBAXIN) 750 MG tablet Take 750 mg by mouth every 6 (six) hours as needed for muscle spasms.   Not Taking  . omeprazole (PRILOSEC) 20 MG capsule Take 1 capsule (20 mg total) by mouth 2 (two) times daily before a meal.   Taking  . oxyCODONE (OXY IR/ROXICODONE) 5 MG immediate release tablet Take 1-2 tablets (5-10 mg total) by mouth every 3 (three) hours as needed for breakthrough pain. 60 tablet 0 Taking  . senna (SENOKOT) 8.6 MG TABS tablet Take 2 tablets (17.2 mg total) by mouth at bedtime. (Patient not taking: Reported on 07/19/2017) 120 each 0 Not Taking  . trazodone (DESYREL) 300 MG tablet Take 300 mg by mouth at bedtime.   0 Not Taking  . venlafaxine XR (EFFEXOR-XR) 150 MG 24 hr capsule Take 1 capsule (150 mg total) by mouth daily.   Taking   No current facility-administered medications for this visit.    Allergies  Allergen Reactions  . Latex Swelling    SWELLING REACTION UNSPECIFIED  Redness,swelling  . Wellbutrin [Bupropion Hcl] Itching and Other (See Comments)    HEAD TINGLES INTOLERANCE: MADE PT. LOOPY    Social History   Tobacco Use  . Smoking status: Never Smoker  . Smokeless tobacco: Never Used  Substance Use Topics  . Alcohol use: No    Family History  Problem Relation Age of Onset  . Cancer Mother        pancreatic and breast  . Deep vein thrombosis Mother   . Diabetes Mother   . Heart disease Mother   . Other Mother        varicose veing  . Heart disease Father   . Hyperlipidemia Father   . Hypertension Father   . Heart attack Father   . COPD Father   . AAA (abdominal aortic aneurysm) Father   . Diabetes Brother   . Heart attack Brother   . Heart disease Brother      Review of Systems  Constitutional: Positive for malaise/fatigue.  Eyes: Positive for blurred vision.  Respiratory: Positive for shortness of breath.   Cardiovascular: Negative.   Gastrointestinal: Positive for constipation.  Genitourinary: Negative.    Musculoskeletal: Positive for back pain and joint pain.  Skin: Negative.   Neurological: Negative.   Endo/Heme/Allergies: Negative.   Psychiatric/Behavioral: Negative.     Objective:  Physical Exam  Vitals reviewed. Constitutional: She is oriented to person, place, and time. She appears well-developed and well-nourished.  HENT:  Head: Normocephalic and atraumatic.  Eyes: Pupils are equal, round, and reactive to light. Conjunctivae and EOM are normal.  Neck: Normal range of motion. Neck supple.  Cardiovascular: Normal rate, regular rhythm and intact distal pulses.  Respiratory: Effort normal. No respiratory distress.  GI: Soft. She exhibits no distension.  Genitourinary:  Genitourinary Comments: Deferred  Neurological: She  is alert and oriented to person, place, and time. She has normal reflexes.  Skin: Skin is warm and dry.  Psychiatric: She has a normal mood and affect. Her behavior is normal. Judgment and thought content normal.    Vital signs in last 24 hours: @VSRANGES @  Labs:   Estimated body mass index is 30.06 kg/m as calculated from the following:   Height as of 10/28/17: 5' 3"  (1.6 m).   Weight as of 10/28/17: 77 kg.   Imaging Review Plain radiographs demonstrate severe degenerative joint disease of the left knee(s). The overall alignment issignificant varus. The bone quality appears to be adequate for age and reported activity level.   Preoperative templating of the joint replacement has been completed, documented, and submitted to the Operating Room personnel in order to optimize intra-operative equipment management.   Anticipated LOS equal to or greater than 2 midnights due to - Age 29 and older with one or more of the following:  - Obesity  - Expected need for hospital services (PT, OT, Nursing) required for safe  discharge  - Anticipated need for postoperative skilled nursing care or inpatient rehab  - Active co-morbidities: Chronic pain requiring  opiods OR   - Unanticipated findings during/Post Surgery: None  - Patient is a high risk of re-admission due to: None     Assessment/Plan:  End stage arthritis, left knee   The patient history, physical examination, clinical judgment of the provider and imaging studies are consistent with end stage degenerative joint disease of the left knee(s) and total knee arthroplasty is deemed medically necessary. The treatment options including medical management, injection therapy arthroscopy and arthroplasty were discussed at length. The risks and benefits of total knee arthroplasty were presented and reviewed. The risks due to aseptic loosening, infection, stiffness, patella tracking problems, thromboembolic complications and other imponderables were discussed. The patient acknowledged the explanation, agreed to proceed with the plan and consent was signed. Patient is being admitted for inpatient treatment for surgery, pain control, PT, OT, prophylactic antibiotics, VTE prophylaxis, progressive ambulation and ADL's and discharge planning. The patient is planning to be discharged home with virtual PT. Already has DME. Takes Percocet 7.5/325 chronically.

## 2018-02-20 NOTE — H&P (Signed)
TOTAL KNEE ADMISSION H&P  Patient is being admitted for left total knee arthroplasty.  Subjective:  Chief Complaint:left knee pain.  HPI: Tammy Sandoval, 62 y.o. female, has a history of pain and functional disability in the left knee due to arthritis and has failed non-surgical conservative treatments for greater than 12 weeks to includeNSAID's and/or analgesics, corticosteriod injections, viscosupplementation injections, flexibility and strengthening excercises, use of assistive devices, weight reduction as appropriate and activity modification.  Onset of symptoms was gradual, starting 2 years ago with rapidlly worsening course since that time. The patient noted no past surgery on the left knee(s).  Patient currently rates pain in the left knee(s) at 10 out of 10 with activity. Patient has night pain, worsening of pain with activity and weight bearing, pain that interferes with activities of daily living, pain with passive range of motion, crepitus and joint swelling.  Patient has evidence of subchondral cysts, subchondral sclerosis, periarticular osteophytes and joint space narrowing by imaging studies. There is no active infection.  Patient Active Problem List   Diagnosis Date Noted  . Osteoarthritis of right knee 02/23/2016  . Major depression 09/23/2015  . Osteoarthritis of spine with radiculopathy, cervical region 08/22/2015  . Varicose veins of bilateral lower extremities with other complications 65/06/5463  . Chronic venous insufficiency 08/13/2011   Past Medical History:  Diagnosis Date  . Anemia   . Anxiety   . Arthritis   . Cancer [facility])    skin-surgically removed  . [place] transient ischemic attacks    pt has no idea about this, has never had TIA's or strokes  . Depression   . Dyspnea    with exertion  . Generalized headaches   . GERD (gastroesophageal reflux disease)   . Gestational diabetes   . Insomnia   . Irritable bowel syndrome    mostly constipation  . Leg  pain   . Low back pain   . Obesity   . Palpitations   . Restless leg   . Seizures ([place]) 2013   only one she has ever had, placed on neurontin  . Thrombophlebitis   . Ulcer    RLE  . Varicose veins     Past Surgical History:  Procedure Laterality Date  . APPENDECTOMY    . BREAST LUMPECTOMY     bilateral  . CERVICAL DISC ARTHROPLASTY N/A 08/22/2015   Procedure: Cervical five - six Cervical six - seven arthroplasty;  Surgeon: Ashok Pall, MD;  Location: [facility]UR[facility] ORS;  Service: Neurosurgery;  Laterality: N/A;  C56 C67 arthroplasty  . CESAREAN SECTION    . COLONOSCOPY    . KNEE ARTHROPLASTY Right 02/23/2016   Procedure: COMPUTER ASSISTED TOTAL KNEE ARTHROPLASTY NAVIGATION;  Surgeon: Rod Can, MD;  Location: [facility];  Service: Orthopedics;  Laterality: Right;  . LEG SURGERY  2004   mass removed on right leg  . skin cancer removal  2012 & 2013   2 places on face  . TOTAL KNEE ARTHROPLASTY Right 02/23/2015    Current Outpatient Medications  Medication Sig Dispense Refill Last Dose  . apixaban (ELIQUIS) 2.5 MG TABS tablet Take 1 tablet (2.5 mg total) by mouth every 12 (twelve) hours. (Patient not taking: Reported on 07/19/2017) 60 tablet 0 Not Taking  . ARIPiprazole (ABILIFY) 2 MG tablet Take 1 tablet (2 mg total) by mouth daily. 30 tablet 0 Taking  . ARIPiprazole (ABILIFY) 5 MG tablet Take 5 mg by mouth daily.   0 Taking  . baclofen (LIORESAL) 10 MG  tablet   0 Taking  . cyclobenzaprine (FLEXERIL) 10 MG tablet   0 Taking  . diclofenac (VOLTAREN) 75 MG EC tablet Take by mouth.   Taking  . furosemide (LASIX) 40 MG tablet Take 40 mg by mouth daily.  0 Taking  . gabapentin (NEURONTIN) 600 MG tablet Take 600 mg by mouth 3 (three) times daily.  0 Taking  . hydrOXYzine (ATARAX/VISTARIL) 25 MG tablet take 1 tablet by mouth twice a day   Taking  . linaclotide (LINZESS) 145 MCG CAPS capsule Take 1 capsule (145 mcg total) by mouth daily. (Patient not taking: Reported on 07/19/2017) 30 capsule  Not  Taking  . methocarbamol (ROBAXIN) 750 MG tablet Take 750 mg by mouth every 6 (six) hours as needed for muscle spasms.   Not Taking  . omeprazole (PRILOSEC) 20 MG capsule Take 1 capsule (20 mg total) by mouth 2 (two) times daily before a meal.   Taking  . oxyCODONE (OXY IR/ROXICODONE) 5 MG immediate release tablet Take 1-2 tablets (5-10 mg total) by mouth every 3 (three) hours as needed for breakthrough pain. 60 tablet 0 Taking  . senna (SENOKOT) 8.6 MG TABS tablet Take 2 tablets (17.2 mg total) by mouth at bedtime. (Patient not taking: Reported on 07/19/2017) 120 each 0 Not Taking  . trazodone (DESYREL) 300 MG tablet Take 300 mg by mouth at bedtime.   0 Not Taking  . venlafaxine XR (EFFEXOR-XR) 150 MG 24 hr capsule Take 1 capsule (150 mg total) by mouth daily.   Taking   No current facility-administered medications for this visit.    Allergies  Allergen Reactions  . Latex Swelling    SWELLING REACTION UNSPECIFIED  Redness,swelling  . Wellbutrin [Bupropion Hcl] Itching and Other (See Comments)    HEAD TINGLES INTOLERANCE: MADE PT. LOOPY    Social History   Tobacco Use  . Smoking status: Never Smoker  . Smokeless tobacco: Never Used  Substance Use Topics  . Alcohol use: No    Family History  Problem Relation Age of Onset  . Cancer Mother        pancreatic and breast  . Deep vein thrombosis Mother   . Diabetes Mother   . Heart disease Mother   . Other Mother        varicose veing  . Heart disease Father   . Hyperlipidemia Father   . Hypertension Father   . Heart attack Father   . COPD Father   . AAA (abdominal aortic aneurysm) Father   . Diabetes Brother   . Heart attack Brother   . Heart disease Brother      Review of Systems  Constitutional: Positive for malaise/fatigue.  Eyes: Positive for blurred vision.  Respiratory: Positive for shortness of breath.   Cardiovascular: Negative.   Gastrointestinal: Positive for constipation.  Genitourinary: Negative.    Musculoskeletal: Positive for back pain and joint pain.  Skin: Negative.   Neurological: Negative.   Endo/Heme/Allergies: Negative.   Psychiatric/Behavioral: Negative.     Objective:  Physical Exam  Vitals reviewed. Constitutional: She is oriented to person, place, and time. She appears well-developed and well-nourished.  HENT:  Head: Normocephalic and atraumatic.  Eyes: Pupils are equal, round, and reactive to light. Conjunctivae and EOM are normal.  Neck: Normal range of motion. Neck supple.  Cardiovascular: Normal rate, regular rhythm and intact distal pulses.  Respiratory: Effort normal. No respiratory distress.  GI: Soft. She exhibits no distension.  Genitourinary:  Genitourinary Comments: Deferred  Neurological: She  is alert and oriented to person, place, and time. She has normal reflexes.  Skin: Skin is warm and dry.  Psychiatric: She has a normal mood and affect. Her behavior is normal. Judgment and thought content normal.    Vital signs in last 24 hours: @VSRANGES @  Labs:   Estimated body mass index is 30.06 kg/m as calculated from the following:   Height as of 10/28/17: 5' 3"  (1.6 m).   Weight as of 10/28/17: 77 kg.   Imaging Review Plain radiographs demonstrate severe degenerative joint disease of the left knee(s). The overall alignment issignificant varus. The bone quality appears to be adequate for age and reported activity level.   Preoperative templating of the joint replacement has been completed, documented, and submitted to the Operating Room personnel in order to optimize intra-operative equipment management.   Anticipated LOS equal to or greater than 2 midnights due to - Age 46 and older with one or more of the following:  - Obesity  - Expected need for hospital services (PT, OT, Nursing) required for safe  discharge  - Anticipated need for postoperative skilled nursing care or inpatient rehab  - Active co-morbidities: Chronic pain requiring  opiods OR   - Unanticipated findings during/Post Surgery: None  - Patient is a high risk of re-admission due to: None     Assessment/Plan:  End stage arthritis, left knee   The patient history, physical examination, clinical judgment of the provider and imaging studies are consistent with end stage degenerative joint disease of the left knee(s) and total knee arthroplasty is deemed medically necessary. The treatment options including medical management, injection therapy arthroscopy and arthroplasty were discussed at length. The risks and benefits of total knee arthroplasty were presented and reviewed. The risks due to aseptic loosening, infection, stiffness, patella tracking problems, thromboembolic complications and other imponderables were discussed. The patient acknowledged the explanation, agreed to proceed with the plan and consent was signed. Patient is being admitted for inpatient treatment for surgery, pain control, PT, OT, prophylactic antibiotics, VTE prophylaxis, progressive ambulation and ADL's and discharge planning. The patient is planning to be discharged home with virtual PT. Already has DME. Takes Percocet 7.5/325 chronically.

## 2018-02-22 DIAGNOSIS — M47812 Spondylosis without myelopathy or radiculopathy, cervical region: Secondary | ICD-10-CM | POA: Diagnosis not present

## 2018-02-28 DIAGNOSIS — F419 Anxiety disorder, unspecified: Secondary | ICD-10-CM | POA: Diagnosis not present

## 2018-02-28 DIAGNOSIS — G629 Polyneuropathy, unspecified: Secondary | ICD-10-CM | POA: Diagnosis not present

## 2018-02-28 DIAGNOSIS — M545 Low back pain: Secondary | ICD-10-CM | POA: Diagnosis not present

## 2018-03-02 ENCOUNTER — Other Ambulatory Visit (HOSPITAL_COMMUNITY): Payer: Self-pay | Admitting: *Deleted

## 2018-03-02 NOTE — Progress Notes (Signed)
Medical clearance dr [id number] on chart

## 2018-03-02 NOTE — Patient Instructions (Addendum)
KIMBERLI WINNE  03/02/2018   Your procedure is scheduled on: 03-15-18  Report to [facility] Main  Entrance  Report to admitting at 800 AM    Call this number if you have problems the morning of surgery [phone]   Remember: Do not eat food or drink liquids :After Midnight. BRUSH YOUR TEETH MORNING OF SURGERY AND RINSE YOUR MOUTH OUT, NO CHEWING GUM CANDY OR MINTS.     Take these medicines the morning of surgery with A SIP OF WATER: GABAPENTIN (NEURONTIN), VENLAFAXINE XR (EFFEXOR),  XR),OMEPRAZOLE (PRILOSEC) ARIPRAZOLE (ABILIFY), HYDROXYZINE, OXYCODONE IF NEEDED                               You may not have any metal on your body including hair pins and              piercings  Do not wear jewelry, make-up, lotions, powders or perfumes, deodorant             Do not wear nail polish.  Do not shave  48 hours prior to surgery.               Do not bring valuables to the hospital. [place].  Contacts, dentures or bridgework may not be worn into surgery.  Leave suitcase in the car. After surgery it may be brought to your room.                 Please read over the following fact sheets you were given: _____________________________________________________________________             [facility] - Preparing for Surgery Before surgery, you can play an important role.  Because skin is not sterile, your skin needs to be as free of germs as possible.  You can reduce the number of germs on your skin by washing with CHG (chlorahexidine gluconate) soap before surgery.  CHG is an antiseptic cleaner which kills germs and bonds with the skin to continue killing germs even after washing. Please DO NOT use if you have an allergy to CHG or antibacterial soaps.  If your skin becomes reddened/irritated stop using the CHG and inform your nurse when you arrive at Short Stay. Do not shave (including legs and underarms) for at  least 48 hours prior to the first CHG shower.  You may shave your face/neck. Please follow these instructions carefully:  1.  Shower with CHG Soap the night before surgery and the  morning of Surgery.  2.  If you choose to wash your hair, wash your hair first as usual with your  normal  shampoo.  3.  After you shampoo, rinse your hair and body thoroughly to remove the  shampoo.                           4.  Use CHG as you would any other liquid soap.  You can apply chg directly  to the skin and wash                       Gently with a scrungie or clean washcloth.  5.  Apply the CHG Soap to your body ONLY  FROM THE NECK DOWN.   Do not use on face/ open                           Wound or open sores. Avoid contact with eyes, ears mouth and genitals (private parts).                       Wash face,  Genitals (private parts) with your normal soap.             6.  Wash thoroughly, paying special attention to the area where your surgery  will be performed.  7.  Thoroughly rinse your body with warm water from the neck down.  8.  DO NOT shower/wash with your normal soap after using and rinsing off  the CHG Soap.                9.  Pat yourself dry with a clean towel.            10.  Wear clean pajamas.            11.  Place clean sheets on your bed the night of your first shower and do not  sleep with pets. Day of Surgery : Do not apply any lotions/deodorants the morning of surgery.  Please wear clean clothes to the hospital/surgery center.  FAILURE TO FOLLOW THESE INSTRUCTIONS MAY RESULT IN THE CANCELLATION OF YOUR SURGERY PATIENT SIGNATURE_________________________________  NURSE SIGNATURE__________________________________  ________________________________________________________________________   Adam Phenix  An incentive spirometer is a tool that can help keep your lungs clear and active. This tool measures how well you are filling your lungs with each breath. Taking long deep breaths  may help reverse or decrease the chance of developing breathing (pulmonary) problems (especially infection) following:  A long period of time when you are unable to move or be active. BEFORE THE PROCEDURE   If the spirometer includes an indicator to show your best effort, your nurse or respiratory therapist will set it to a desired goal.  If possible, sit up straight or lean slightly forward. Try not to slouch.  Hold the incentive spirometer in an upright position. INSTRUCTIONS FOR USE  1. Sit on the edge of your bed if possible, or sit up as far as you can in bed or on a chair. 2. Hold the incentive spirometer in an upright position. 3. Breathe out normally. 4. Place the mouthpiece in your mouth and seal your lips tightly around it. 5. Breathe in slowly and as deeply as possible, raising the piston or the ball toward the top of the column. 6. Hold your breath for 3-5 seconds or for as long as possible. Allow the piston or ball to fall to the bottom of the column. 7. Remove the mouthpiece from your mouth and breathe out normally. 8. Rest for a few seconds and repeat Steps 1 through 7 at least 10 times every 1-2 hours when you are awake. Take your time and take a few normal breaths between deep breaths. 9. The spirometer may include an indicator to show your best effort. Use the indicator as a goal to work toward during each repetition. 10. After each set of 10 deep breaths, practice coughing to be sure your lungs are clear. If you have an incision (the cut made at the time of surgery), support your incision when coughing by placing a pillow or rolled up towels firmly against it. Once you  are able to get out of bed, walk around indoors and cough well. You may stop using the incentive spirometer when instructed by your caregiver.  RISKS AND COMPLICATIONS  Take your time so you do not get dizzy or light-headed.  If you are in pain, you may need to take or ask for pain medication before doing  incentive spirometry. It is harder to take a deep breath if you are having pain. AFTER USE  Rest and breathe slowly and easily.  It can be helpful to keep track of a log of your progress. Your caregiver can provide you with a simple table to help with this. If you are using the spirometer at home, follow these instructions: [place] IF:   You are having difficultly using the spirometer.  You have trouble using the spirometer as often as instructed.  Your pain medication is not giving enough relief while using the spirometer.  You develop fever of 100.5 F (38.1 C) or higher. SEEK IMMEDIATE MEDICAL CARE IF:   You cough up bloody sputum that had not been present before.  You develop fever of 102 F (38.9 C) or greater.  You develop worsening pain at or near the incision site. MAKE SURE YOU:   Understand these instructions.  Will watch your condition.  Will get help right away if you are not doing well or get worse. Document Released: 08/16/2006 Document Revised: 06/28/2011 Document Reviewed: 10/17/2006 Exit[employer] Patient Information 2014 Exit[employer], [place].   ________________________________________________________________________  WHAT IS A BLOOD TRANSFUSION? Blood Transfusion Information  A transfusion is the replacement of blood or some of its parts. Blood is made up of multiple cells which provide different functions.  Red blood cells carry oxygen and are used for blood loss replacement.  White blood cells fight against infection.  Platelets control bleeding.  Plasma helps clot blood.  Other blood products are available for specialized needs, such as hemophilia or other clotting disorders. BEFORE THE TRANSFUSION  Who gives blood for transfusions?   Healthy volunteers who are fully evaluated to make sure their blood is safe. This is blood bank blood. Transfusion therapy is the safest it has ever been in the practice of medicine. Before blood is taken from a  donor, a complete history is taken to make sure that person has no history of diseases nor engages in risky social behavior (examples are intravenous drug use or sexual activity with multiple partners). The donor's travel history is screened to minimize risk of transmitting infections, such as malaria. The donated blood is tested for signs of infectious diseases, such as HIV and hepatitis. The blood is then tested to be sure it is compatible with you in order to minimize the chance of a transfusion reaction. If you or a relative donates blood, this is often done in anticipation of surgery and is not appropriate for emergency situations. It takes many days to process the donated blood. RISKS AND COMPLICATIONS Although transfusion therapy is very safe and saves many lives, the main dangers of transfusion include:   Getting an infectious disease.  Developing a transfusion reaction. This is an allergic reaction to something in the blood you were given. Every precaution is taken to prevent this. The decision to have a blood transfusion has been considered carefully by your caregiver before blood is given. Blood is not given unless the benefits outweigh the risks. AFTER THE TRANSFUSION  Right after receiving a blood transfusion, you will usually feel much better and more energetic. This is  especially true if your red blood cells have gotten low (anemic). The transfusion raises the level of the red blood cells which carry oxygen, and this usually causes an energy increase.  The nurse administering the transfusion will monitor you carefully for complications. HOME CARE INSTRUCTIONS  No special instructions are needed after a transfusion. You may find your energy is better. Speak with your caregiver about any limitations on activity for underlying diseases you may have. SEEK MEDICAL CARE IF:   Your condition is not improving after your transfusion.  You develop redness or irritation at the intravenous (IV)  site. SEEK IMMEDIATE MEDICAL CARE IF:  Any of the following symptoms occur over the next 12 hours:  Shaking chills.  You have a temperature by mouth above 102 F (38.9 C), not controlled by medicine.  Chest, back, or muscle pain.  People around you feel you are not acting correctly or are confused.  Shortness of breath or difficulty breathing.  Dizziness and fainting.  You get a rash or develop hives.  You have a decrease in urine output.  Your urine turns a dark color or changes to pink, red, or brown. Any of the following symptoms occur over the next 10 days:  You have a temperature by mouth above 102 F (38.9 C), not controlled by medicine.  Shortness of breath.  Weakness after normal activity.  The white part of the eye turns yellow (jaundice).  You have a decrease in the amount of urine or are urinating less often.  Your urine turns a dark color or changes to pink, red, or brown. Document Released: 04/02/2000 Document Revised: 06/28/2011 Document Reviewed: 11/20/2007 [facility] Patient Information 2014 [place], [place].  _______________________________________________________________________

## 2018-03-09 ENCOUNTER — Encounter (HOSPITAL_COMMUNITY)
Admission: RE | Admit: 2018-03-09 | Discharge: 2018-03-09 | Disposition: A | Discharge status: Home / Self Care | Payer: Medicare Other | Source: Ambulatory Visit | Attending: Orthopedic Surgery | Admitting: Orthopedic Surgery

## 2018-03-09 ENCOUNTER — Encounter (HOSPITAL_COMMUNITY): Payer: Self-pay

## 2018-03-09 ENCOUNTER — Other Ambulatory Visit: Payer: Self-pay

## 2018-03-09 DIAGNOSIS — M1712 Unilateral primary osteoarthritis, left knee: Secondary | ICD-10-CM | POA: Insufficient documentation

## 2018-03-09 DIAGNOSIS — Z01812 Encounter for preprocedural laboratory examination: Secondary | ICD-10-CM | POA: Diagnosis not present

## 2018-03-09 HISTORY — DX: Prediabetes: R73.03

## 2018-03-09 LAB — ABO/RH: ABO/RH(D): AB POS

## 2018-03-09 LAB — BASIC METABOLIC PANEL
Anion gap: 7 (ref 5–15)
BUN: 14 mg/dL (ref 8–23)
CO2: 28 mmol/L (ref 22–32)
Calcium: 8.9 mg/dL (ref 8.9–10.3)
Chloride: 105 mmol/L (ref 98–111)
Creatinine, Ser: 0.8 mg/dL (ref 0.44–1.00)
GFR calc Af Amer: >60 mL/min (ref >60)
GFR calc non Af Amer: >60 mL/min (ref >60)
Glucose, Bld: 90 mg/dL (ref 70–99)
Potassium: 4.1 mmol/L (ref 3.5–5.1)
Sodium: 140 mmol/L (ref 135–145)

## 2018-03-09 LAB — CBC
HCT: 43.5 % (ref 36.0–46.0)
Hemoglobin: 13.7 g/dL (ref 12.0–15.0)
MCH: 29.7 pg (ref 26.0–34.0)
MCHC: 31.5 g/dL (ref 30.0–36.0)
MCV: 94.4 fL (ref 80.0–100.0)
Platelets: 417 10*3/uL — ABNORMAL HIGH (ref 150–400)
RBC: 4.61 MIL/uL (ref 3.87–5.11)
RDW: 13.8 % (ref 11.5–15.5)
WBC: 7.9 10*3/uL (ref 4.0–10.5)
nRBC: 0 % (ref 0.0–0.2)

## 2018-03-09 LAB — HEMOGLOBIN A1C
Hgb A1c MFr Bld: 5.3 % (ref 4.8–5.6)
Mean Plasma Glucose: 105.41 mg/dL

## 2018-03-09 LAB — SURGICAL PCR SCREEN
MRSA, PCR: NEGATIVE
Staphylococcus aureus: POSITIVE — AB

## 2018-03-13 ENCOUNTER — Encounter (HOSPITAL_COMMUNITY): Payer: Self-pay

## 2018-03-15 ENCOUNTER — Other Ambulatory Visit: Payer: Self-pay

## 2018-03-15 ENCOUNTER — Encounter (HOSPITAL_COMMUNITY)
Admission: RE | Disposition: A | Discharge status: Home / Self Care | Payer: Self-pay | Source: Ambulatory Visit | Attending: Orthopedic Surgery

## 2018-03-15 ENCOUNTER — Encounter (HOSPITAL_COMMUNITY): Payer: Self-pay | Admitting: *Deleted

## 2018-03-15 ENCOUNTER — Inpatient Hospital Stay (HOSPITAL_COMMUNITY)
Admission: RE | Admit: 2018-03-15 | Discharge: 2018-03-17 | DRG: 470 | Disposition: A | Discharge status: Home / Self Care | Payer: Medicare Other | Source: Ambulatory Visit | Attending: Orthopedic Surgery | Admitting: Orthopedic Surgery

## 2018-03-15 ENCOUNTER — Inpatient Hospital Stay (HOSPITAL_COMMUNITY): Payer: Medicare Other

## 2018-03-15 ENCOUNTER — Inpatient Hospital Stay (HOSPITAL_COMMUNITY): Payer: Medicare Other | Admitting: Certified Registered"

## 2018-03-15 DIAGNOSIS — E669 Obesity, unspecified: Secondary | ICD-10-CM | POA: Diagnosis present

## 2018-03-15 DIAGNOSIS — Z96652 Presence of left artificial knee joint: Secondary | ICD-10-CM | POA: Diagnosis not present

## 2018-03-15 DIAGNOSIS — I8390 Asymptomatic varicose veins of unspecified lower extremity: Secondary | ICD-10-CM | POA: Diagnosis present

## 2018-03-15 DIAGNOSIS — F329 Major depressive disorder, single episode, unspecified: Secondary | ICD-10-CM | POA: Diagnosis present

## 2018-03-15 DIAGNOSIS — R569 Unspecified convulsions: Secondary | ICD-10-CM | POA: Diagnosis present

## 2018-03-15 DIAGNOSIS — Z8672 Personal history of thrombophlebitis: Secondary | ICD-10-CM

## 2018-03-15 DIAGNOSIS — Z825 Family history of asthma and other chronic lower respiratory diseases: Secondary | ICD-10-CM

## 2018-03-15 DIAGNOSIS — Z7901 Long term (current) use of anticoagulants: Secondary | ICD-10-CM

## 2018-03-15 DIAGNOSIS — Z8249 Family history of ischemic heart disease and other diseases of the circulatory system: Secondary | ICD-10-CM

## 2018-03-15 DIAGNOSIS — Z79899 Other long term (current) drug therapy: Secondary | ICD-10-CM

## 2018-03-15 DIAGNOSIS — Z96651 Presence of right artificial knee joint: Secondary | ICD-10-CM | POA: Diagnosis present

## 2018-03-15 DIAGNOSIS — Z888 Allergy status to other drugs, medicaments and biological substances status: Secondary | ICD-10-CM

## 2018-03-15 DIAGNOSIS — Z9049 Acquired absence of other specified parts of digestive tract: Secondary | ICD-10-CM

## 2018-03-15 DIAGNOSIS — G2581 Restless legs syndrome: Secondary | ICD-10-CM | POA: Diagnosis present

## 2018-03-15 DIAGNOSIS — Z471 Aftercare following joint replacement surgery: Secondary | ICD-10-CM | POA: Diagnosis not present

## 2018-03-15 DIAGNOSIS — Z803 Family history of malignant neoplasm of breast: Secondary | ICD-10-CM | POA: Diagnosis not present

## 2018-03-15 DIAGNOSIS — K589 Irritable bowel syndrome without diarrhea: Secondary | ICD-10-CM | POA: Diagnosis present

## 2018-03-15 DIAGNOSIS — G4700 Insomnia, unspecified: Secondary | ICD-10-CM | POA: Diagnosis present

## 2018-03-15 DIAGNOSIS — Z8673 Personal history of transient ischemic attack (TIA), and cerebral infarction without residual deficits: Secondary | ICD-10-CM

## 2018-03-15 DIAGNOSIS — Z91040 Latex allergy status: Secondary | ICD-10-CM

## 2018-03-15 DIAGNOSIS — G8918 Other acute postprocedural pain: Secondary | ICD-10-CM | POA: Diagnosis not present

## 2018-03-15 DIAGNOSIS — F419 Anxiety disorder, unspecified: Secondary | ICD-10-CM | POA: Diagnosis present

## 2018-03-15 DIAGNOSIS — Z8349 Family history of other endocrine, nutritional and metabolic diseases: Secondary | ICD-10-CM

## 2018-03-15 DIAGNOSIS — M1712 Unilateral primary osteoarthritis, left knee: Secondary | ICD-10-CM | POA: Diagnosis present

## 2018-03-15 DIAGNOSIS — Z833 Family history of diabetes mellitus: Secondary | ICD-10-CM | POA: Diagnosis not present

## 2018-03-15 DIAGNOSIS — Z800 Family history of malignant neoplasm of digestive organs: Secondary | ICD-10-CM | POA: Diagnosis not present

## 2018-03-15 DIAGNOSIS — Z6829 Body mass index (BMI) 29.0-29.9, adult: Secondary | ICD-10-CM | POA: Diagnosis not present

## 2018-03-15 HISTORY — PX: KNEE ARTHROPLASTY: SHX992

## 2018-03-15 LAB — TYPE AND SCREEN
ABO/RH(D): AB POS
Antibody Screen: NEGATIVE

## 2018-03-15 SURGERY — ARTHROPLASTY, KNEE, TOTAL, USING IMAGELESS COMPUTER-ASSISTED NAVIGATION
Anesthesia: Regional | Laterality: Left

## 2018-03-15 MED ORDER — LACTATED RINGERS IV SOLN
INTRAVENOUS | Status: DC
  Administered 2018-03-15 (×2): via INTRAVENOUS

## 2018-03-15 MED ORDER — ACETAMINOPHEN 10 MG/ML IV SOLN
1000.0000 mg | INTRAVENOUS | Status: AC
  Administered 2018-03-15: 1000 mg via INTRAVENOUS
  Filled 2018-03-15: qty 100

## 2018-03-15 MED ORDER — OXYCODONE HCL ER 10 MG PO T12A
10.0000 mg | EXTENDED_RELEASE_TABLET | Freq: Two times a day (BID) | ORAL | Status: DC
  Administered 2018-03-15 – 2018-03-17 (×4): 10 mg via ORAL
  Filled 2018-03-15 (×4): qty 1

## 2018-03-15 MED ORDER — SODIUM CHLORIDE (PF) 0.9 % IJ SOLN
INTRAMUSCULAR | Status: DC | PRN
  Administered 2018-03-15: 30 mL

## 2018-03-15 MED ORDER — ONDANSETRON HCL 4 MG PO TABS: 4.0000 mg | ORAL_TABLET | Freq: Four times a day (QID) | ORAL | Status: DC | PRN

## 2018-03-15 MED ORDER — ISOPROPYL ALCOHOL 70 % SOLN
Status: DC | PRN
  Administered 2018-03-15: 1 via TOPICAL

## 2018-03-15 MED ORDER — CYCLOSPORINE 0.05 % OP EMUL
1.0000 [drp] | Freq: Every day | OPHTHALMIC | Status: DC | PRN
  Administered 2018-03-16: 1 [drp] via OPHTHALMIC
  Filled 2018-03-15 (×2): qty 1

## 2018-03-15 MED ORDER — METHOCARBAMOL 500 MG IVPB - SIMPLE MED
500.0000 mg | Freq: Four times a day (QID) | INTRAVENOUS | Status: DC | PRN
  Administered 2018-03-15: 500 mg via INTRAVENOUS
  Filled 2018-03-15: qty 50

## 2018-03-15 MED ORDER — DOCUSATE SODIUM 100 MG PO CAPS
100.0000 mg | ORAL_CAPSULE | Freq: Two times a day (BID) | ORAL | Status: DC
  Administered 2018-03-15 – 2018-03-17 (×4): 100 mg via ORAL
  Filled 2018-03-15 (×4): qty 1

## 2018-03-15 MED ORDER — BUPIVACAINE-EPINEPHRINE 0.25% -1:200000 IJ SOLN
INTRAMUSCULAR | Status: DC | PRN
  Administered 2018-03-15: 30 mL

## 2018-03-15 MED ORDER — PHENYLEPHRINE 40 MCG/ML (10ML) SYRINGE FOR IV PUSH (FOR BLOOD PRESSURE SUPPORT)
PREFILLED_SYRINGE | INTRAVENOUS | Status: DC | PRN
  Administered 2018-03-15 (×3): 80 ug via INTRAVENOUS
  Administered 2018-03-15: 40 ug via INTRAVENOUS
  Administered 2018-03-15: 80 ug via INTRAVENOUS

## 2018-03-15 MED ORDER — CEFAZOLIN SODIUM-DEXTROSE 2-4 GM/100ML-% IV SOLN
2.0000 g | Freq: Four times a day (QID) | INTRAVENOUS | Status: AC
  Administered 2018-03-15 (×2): 2 g via INTRAVENOUS
  Filled 2018-03-15 (×2): qty 100

## 2018-03-15 MED ORDER — LIDOCAINE 2% (20 MG/ML) 5 ML SYRINGE
INTRAMUSCULAR | Status: DC | PRN
  Administered 2018-03-15: 40 mg via INTRAVENOUS

## 2018-03-15 MED ORDER — DIPHENHYDRAMINE HCL 12.5 MG/5ML PO ELIX
12.5000 mg | ORAL_SOLUTION | ORAL | Status: DC | PRN
  Filled 2018-03-15: qty 10

## 2018-03-15 MED ORDER — MENTHOL 3 MG MT LOZG: 1.0000 | LOZENGE | OROMUCOSAL | Status: DC | PRN

## 2018-03-15 MED ORDER — FENTANYL CITRATE (PF) 100 MCG/2ML IJ SOLN
INTRAMUSCULAR | Status: AC
  Filled 2018-03-15: qty 2

## 2018-03-15 MED ORDER — GABAPENTIN 400 MG PO CAPS
1200.0000 mg | ORAL_CAPSULE | Freq: Every day | ORAL | Status: DC
  Administered 2018-03-16 – 2018-03-17 (×2): 1200 mg via ORAL
  Filled 2018-03-15 (×2): qty 3

## 2018-03-15 MED ORDER — DEXAMETHASONE SODIUM PHOSPHATE 10 MG/ML IJ SOLN
INTRAMUSCULAR | Status: AC
  Filled 2018-03-15: qty 1

## 2018-03-15 MED ORDER — ONDANSETRON HCL 4 MG PO TABS: 4.0000 mg | ORAL_TABLET | Freq: Three times a day (TID) | ORAL | 0 refills | Status: DC | PRN

## 2018-03-15 MED ORDER — DEXAMETHASONE SODIUM PHOSPHATE 10 MG/ML IJ SOLN
10.0000 mg | Freq: Once | INTRAMUSCULAR | Status: AC
  Administered 2018-03-16: 10 mg via INTRAVENOUS
  Filled 2018-03-15: qty 1

## 2018-03-15 MED ORDER — TRANEXAMIC ACID-NACL 1000-0.7 MG/100ML-% IV SOLN
1000.0000 mg | INTRAVENOUS | Status: AC
  Administered 2018-03-15: 1000 mg via INTRAVENOUS
  Filled 2018-03-15: qty 100

## 2018-03-15 MED ORDER — METHOCARBAMOL 500 MG IVPB - SIMPLE MED
INTRAVENOUS | Status: AC
  Filled 2018-03-15: qty 50

## 2018-03-15 MED ORDER — PANTOPRAZOLE SODIUM 40 MG PO TBEC
80.0000 mg | DELAYED_RELEASE_TABLET | Freq: Every day | ORAL | Status: DC
  Administered 2018-03-16 – 2018-03-17 (×2): 80 mg via ORAL
  Filled 2018-03-15 (×2): qty 2

## 2018-03-15 MED ORDER — ONDANSETRON HCL 4 MG/2ML IJ SOLN: 4.0000 mg | Freq: Four times a day (QID) | INTRAMUSCULAR | Status: DC | PRN

## 2018-03-15 MED ORDER — ONDANSETRON HCL 4 MG/2ML IJ SOLN
INTRAMUSCULAR | Status: DC | PRN
  Administered 2018-03-15: 4 mg via INTRAVENOUS

## 2018-03-15 MED ORDER — METOCLOPRAMIDE HCL 5 MG PO TABS: 5.0000 mg | ORAL_TABLET | Freq: Three times a day (TID) | ORAL | Status: DC | PRN

## 2018-03-15 MED ORDER — PHENOL 1.4 % MT LIQD: 1.0000 | OROMUCOSAL | Status: DC | PRN

## 2018-03-15 MED ORDER — SODIUM CHLORIDE (PF) 0.9 % IJ SOLN
INTRAMUSCULAR | Status: AC
  Filled 2018-03-15: qty 50

## 2018-03-15 MED ORDER — VENLAFAXINE HCL ER 150 MG PO CP24
300.0000 mg | ORAL_CAPSULE | Freq: Every day | ORAL | Status: DC
  Administered 2018-03-16 – 2018-03-17 (×2): 300 mg via ORAL
  Filled 2018-03-15 (×2): qty 2

## 2018-03-15 MED ORDER — METOCLOPRAMIDE HCL 5 MG/ML IJ SOLN: 5.0000 mg | Freq: Three times a day (TID) | INTRAMUSCULAR | Status: DC | PRN

## 2018-03-15 MED ORDER — KETOROLAC TROMETHAMINE 30 MG/ML IJ SOLN
INTRAMUSCULAR | Status: DC | PRN
  Administered 2018-03-15: 30 mg via INTRAMUSCULAR

## 2018-03-15 MED ORDER — DEXAMETHASONE SODIUM PHOSPHATE 10 MG/ML IJ SOLN
INTRAMUSCULAR | Status: DC | PRN
  Administered 2018-03-15: 10 mg via INTRAVENOUS

## 2018-03-15 MED ORDER — SODIUM CHLORIDE 0.9 % IV SOLN
INTRAVENOUS | Status: DC | PRN
  Administered 2018-03-15: 15 ug/min via INTRAVENOUS

## 2018-03-15 MED ORDER — HYDROMORPHONE HCL 1 MG/ML IJ SOLN
0.5000 mg | INTRAMUSCULAR | Status: DC | PRN
  Administered 2018-03-15 (×2): 1 mg via INTRAVENOUS
  Filled 2018-03-15 (×2): qty 1

## 2018-03-15 MED ORDER — PROPOFOL 10 MG/ML IV BOLUS
INTRAVENOUS | Status: DC | PRN
  Administered 2018-03-15: 20 mg via INTRAVENOUS

## 2018-03-15 MED ORDER — TRAZODONE HCL 100 MG PO TABS
300.0000 mg | ORAL_TABLET | Freq: Every day | ORAL | Status: DC
  Administered 2018-03-15 – 2018-03-16 (×2): 300 mg via ORAL
  Filled 2018-03-15 (×2): qty 3

## 2018-03-15 MED ORDER — ONDANSETRON HCL 4 MG/2ML IJ SOLN: 4.0000 mg | Freq: Once | INTRAMUSCULAR | Status: DC | PRN

## 2018-03-15 MED ORDER — ALUM & MAG HYDROXIDE-SIMETH 200-200-20 MG/5ML PO SUSP: 30.0000 mL | ORAL | Status: DC | PRN

## 2018-03-15 MED ORDER — HYDROMORPHONE HCL 2 MG PO TABS
2.0000 mg | ORAL_TABLET | ORAL | Status: DC | PRN
  Administered 2018-03-15 – 2018-03-17 (×7): 4 mg via ORAL
  Filled 2018-03-15 (×7): qty 2

## 2018-03-15 MED ORDER — PROPOFOL 10 MG/ML IV BOLUS
INTRAVENOUS | Status: AC
  Filled 2018-03-15: qty 40

## 2018-03-15 MED ORDER — FENTANYL CITRATE (PF) 100 MCG/2ML IJ SOLN
50.0000 ug | INTRAMUSCULAR | Status: DC
  Filled 2018-03-15: qty 2

## 2018-03-15 MED ORDER — MIDAZOLAM HCL 2 MG/2ML IJ SOLN
1.0000 mg | INTRAMUSCULAR | Status: DC
  Administered 2018-03-15 (×2): 1 mg via INTRAVENOUS
  Filled 2018-03-15: qty 2

## 2018-03-15 MED ORDER — FENTANYL CITRATE (PF) 100 MCG/2ML IJ SOLN: 25.0000 ug | INTRAMUSCULAR | Status: DC | PRN

## 2018-03-15 MED ORDER — PROPOFOL 10 MG/ML IV BOLUS
INTRAVENOUS | Status: AC
  Filled 2018-03-15: qty 20

## 2018-03-15 MED ORDER — GABAPENTIN 400 MG PO CAPS
1800.0000 mg | ORAL_CAPSULE | Freq: Every day | ORAL | Status: DC
  Administered 2018-03-15 – 2018-03-16 (×2): 1800 mg via ORAL
  Filled 2018-03-15 (×2): qty 2

## 2018-03-15 MED ORDER — APIXABAN 2.5 MG PO TABS: 2.5000 mg | ORAL_TABLET | Freq: Two times a day (BID) | ORAL | 0 refills | Status: DC

## 2018-03-15 MED ORDER — SODIUM CHLORIDE 0.9 % IV SOLN
INTRAVENOUS | Status: DC
  Administered 2018-03-15 – 2018-03-16 (×3): via INTRAVENOUS

## 2018-03-15 MED ORDER — FENTANYL CITRATE (PF) 100 MCG/2ML IJ SOLN
25.0000 ug | INTRAMUSCULAR | Status: DC | PRN
  Administered 2018-03-15 (×3): 50 ug via INTRAVENOUS

## 2018-03-15 MED ORDER — ISOPROPYL ALCOHOL 70 % SOLN
Status: AC
  Filled 2018-03-15: qty 480

## 2018-03-15 MED ORDER — DOCUSATE SODIUM 100 MG PO CAPS: 100.0000 mg | ORAL_CAPSULE | Freq: Two times a day (BID) | ORAL | 3 refills | Status: DC

## 2018-03-15 MED ORDER — BUPIVACAINE IN DEXTROSE 0.75-8.25 % IT SOLN
INTRATHECAL | Status: DC | PRN
  Administered 2018-03-15: 1.6 mL via INTRATHECAL

## 2018-03-15 MED ORDER — APIXABAN 2.5 MG PO TABS
2.5000 mg | ORAL_TABLET | Freq: Two times a day (BID) | ORAL | Status: DC
  Administered 2018-03-16 – 2018-03-17 (×3): 2.5 mg via ORAL
  Filled 2018-03-15 (×3): qty 1

## 2018-03-15 MED ORDER — POVIDONE-IODINE 10 % EX SWAB: 2.0000 "application " | Freq: Once | CUTANEOUS | Status: DC

## 2018-03-15 MED ORDER — GABAPENTIN 600 MG PO TABS: 1200.0000 mg | ORAL_TABLET | ORAL | Status: DC

## 2018-03-15 MED ORDER — METHOCARBAMOL 500 MG PO TABS
500.0000 mg | ORAL_TABLET | Freq: Four times a day (QID) | ORAL | Status: DC | PRN
  Administered 2018-03-15 – 2018-03-17 (×5): 500 mg via ORAL
  Filled 2018-03-15 (×4): qty 1

## 2018-03-15 MED ORDER — SENNA 8.6 MG PO TABS: 2.0000 | ORAL_TABLET | Freq: Every day | ORAL | 3 refills | Status: DC

## 2018-03-15 MED ORDER — CEFAZOLIN SODIUM-DEXTROSE 2-4 GM/100ML-% IV SOLN
2.0000 g | INTRAVENOUS | Status: AC
  Administered 2018-03-15: 2 g via INTRAVENOUS
  Filled 2018-03-15: qty 100

## 2018-03-15 MED ORDER — PROPOFOL 500 MG/50ML IV EMUL
INTRAVENOUS | Status: DC | PRN
  Administered 2018-03-15: 75 ug/kg/min via INTRAVENOUS

## 2018-03-15 MED ORDER — KETOROLAC TROMETHAMINE 30 MG/ML IJ SOLN
INTRAMUSCULAR | Status: AC
  Filled 2018-03-15: qty 1

## 2018-03-15 MED ORDER — POLYETHYLENE GLYCOL 3350 17 G PO PACK: 17.0000 g | PACK | Freq: Every day | ORAL | Status: DC | PRN

## 2018-03-15 MED ORDER — HYDROXYZINE HCL 25 MG PO TABS: 25.0000 mg | ORAL_TABLET | Freq: Two times a day (BID) | ORAL | Status: DC

## 2018-03-15 MED ORDER — ROPIVACAINE HCL 5 MG/ML IJ SOLN
INTRAMUSCULAR | Status: DC | PRN
  Administered 2018-03-15: 25 mL via PERINEURAL

## 2018-03-15 MED ORDER — ARIPIPRAZOLE 5 MG PO TABS
7.5000 mg | ORAL_TABLET | Freq: Every day | ORAL | Status: DC
  Administered 2018-03-16 – 2018-03-17 (×2): 7.5 mg via ORAL
  Filled 2018-03-15 (×2): qty 2

## 2018-03-15 MED ORDER — BUPIVACAINE-EPINEPHRINE (PF) 0.25% -1:200000 IJ SOLN
INTRAMUSCULAR | Status: AC
  Filled 2018-03-15: qty 30

## 2018-03-15 MED ORDER — SODIUM CHLORIDE 0.9 % IV SOLN: INTRAVENOUS | Status: DC

## 2018-03-15 MED ORDER — CHLORHEXIDINE GLUCONATE 4 % EX LIQD: 60.0000 mL | Freq: Once | CUTANEOUS | Status: DC

## 2018-03-15 MED ORDER — ONDANSETRON HCL 4 MG/2ML IJ SOLN
INTRAMUSCULAR | Status: AC
  Filled 2018-03-15: qty 2

## 2018-03-15 MED ORDER — ACETAMINOPHEN 325 MG PO TABS: 325.0000 mg | ORAL_TABLET | Freq: Four times a day (QID) | ORAL | Status: DC | PRN

## 2018-03-15 MED ORDER — SODIUM CHLORIDE 0.9 % IR SOLN
Status: DC | PRN
  Administered 2018-03-15: 4000 mL

## 2018-03-15 MED ORDER — HYDROMORPHONE HCL 2 MG PO TABS: 2.0000 mg | ORAL_TABLET | Freq: Four times a day (QID) | ORAL | 0 refills | Status: AC | PRN

## 2018-03-15 SURGICAL SUPPLY — 65 items
BAG ZIPLOCK 12X15 (MISCELLANEOUS) IMPLANT
BANDAGE ACE 4X5 VEL STRL LF (GAUZE/BANDAGES/DRESSINGS) ×2 IMPLANT
BANDAGE ACE 6X5 VEL STRL LF (GAUZE/BANDAGES/DRESSINGS) ×2 IMPLANT
BATTERY INSTRU NAVIGATION (MISCELLANEOUS) ×6 IMPLANT
BLADE SAW RECIPROCATING 77.5 (BLADE) ×2 IMPLANT
CHLORAPREP W/TINT 26ML (MISCELLANEOUS) ×4 IMPLANT
COVER SURGICAL LIGHT HANDLE (MISCELLANEOUS) ×2 IMPLANT
COVER WAND RF STERILE (DRAPES) ×2 IMPLANT
CUFF TOURN SGL QUICK 34 (TOURNIQUET CUFF) ×1
CUFF TRNQT CYL 34X4X40X1 (TOURNIQUET CUFF) ×1 IMPLANT
DECANTER SPIKE VIAL GLASS SM (MISCELLANEOUS) ×2 IMPLANT
DERMABOND ADVANCED (GAUZE/BANDAGES/DRESSINGS) ×2
DERMABOND ADVANCED .7 DNX12 (GAUZE/BANDAGES/DRESSINGS) ×2 IMPLANT
DRAPE SHEET LG 3/4 BI-LAMINATE (DRAPES) ×6 IMPLANT
DRAPE U-SHAPE 47X51 STRL (DRAPES) ×2 IMPLANT
DRESSING AQUACEL AG SP 3.5X10 (GAUZE/BANDAGES/DRESSINGS) ×1 IMPLANT
DRSG AQUACEL AG ADV 3.5X10 (GAUZE/BANDAGES/DRESSINGS) IMPLANT
DRSG AQUACEL AG SP 3.5X10 (GAUZE/BANDAGES/DRESSINGS) ×2
DRSG TEGADERM 4X4.75 (GAUZE/BANDAGES/DRESSINGS) IMPLANT
ELECT BLADE TIP CTD 4 INCH (ELECTRODE) ×2 IMPLANT
ELECT REM PT RETURN 15FT ADLT (MISCELLANEOUS) ×2 IMPLANT
EVACUATOR 1/8 PVC DRAIN (DRAIN) IMPLANT
FEMORAL RETAINING CRUC KNEE #4 (Knees) ×2 IMPLANT
GAUZE SPONGE 4X4 12PLY STRL (GAUZE/BANDAGES/DRESSINGS) ×2 IMPLANT
GLOVE BIO SURGEON STRL SZ8.5 (GLOVE) IMPLANT
GLOVE BIOGEL PI IND STRL 8.5 (GLOVE) ×1 IMPLANT
GLOVE BIOGEL PI INDICATOR 8.5 (GLOVE) ×1
GLOVE SURG SS PI 8.5 STRL IVOR (GLOVE) ×2
GLOVE SURG SS PI 8.5 STRL STRW (GLOVE) ×2 IMPLANT
GOWN SPEC L3 XXLG W/TWL (GOWN DISPOSABLE) ×2 IMPLANT
HANDPIECE INTERPULSE COAX TIP (DISPOSABLE) ×1
HOLDER FOLEY CATH W/STRAP (MISCELLANEOUS) ×2 IMPLANT
HOOD PEEL AWAY FLYTE STAYCOOL (MISCELLANEOUS) ×8 IMPLANT
INSERT TIBIAL KNEE BRG SZ4 9MM (Insert) ×2 IMPLANT
KNEE PATELLA ASYMMETRIC 10X32 (Knees) ×2 IMPLANT
KNEE TIBIAL COMP TRI SZ4 (Knees) ×2 IMPLANT
MARKER SKIN DUAL TIP RULER LAB (MISCELLANEOUS) ×4 IMPLANT
NEEDLE SPNL 18GX3.5 QUINCKE PK (NEEDLE) ×2 IMPLANT
NS IRRIG 1000ML POUR BTL (IV SOLUTION) IMPLANT
PACK TOTAL KNEE CUSTOM (KITS) ×2 IMPLANT
PADDING CAST COTTON 6X4 STRL (CAST SUPPLIES) ×2 IMPLANT
PROTECTOR NERVE ULNAR (MISCELLANEOUS) ×2 IMPLANT
SAW OSC TIP CART 19.5X105X1.3 (SAW) ×2 IMPLANT
SEALER BIPOLAR AQUA 6.0 (INSTRUMENTS) ×2 IMPLANT
SET HNDPC FAN SPRY TIP SCT (DISPOSABLE) ×1 IMPLANT
SET PAD KNEE POSITIONER (MISCELLANEOUS) ×2 IMPLANT
SPONGE DRAIN TRACH 4X4 STRL 2S (GAUZE/BANDAGES/DRESSINGS) IMPLANT
SPONGE LAP 18X18 RF (DISPOSABLE) IMPLANT
SUT MNCRL AB 3-0 PS2 18 (SUTURE) ×4 IMPLANT
SUT MNCRL AB 4-0 PS2 18 (SUTURE) ×2 IMPLANT
SUT MON AB 2-0 CT1 36 (SUTURE) ×4 IMPLANT
SUT STRATAFIX PDO 1 14 VIOLET (SUTURE) ×1
SUT STRATFX PDO 1 14 VIOLET (SUTURE) ×1
SUT VIC AB 1 CTX 36 (SUTURE) ×2
SUT VIC AB 1 CTX36XBRD ANBCTR (SUTURE) ×2 IMPLANT
SUT VIC AB 2-0 CT1 27 (SUTURE) ×1
SUT VIC AB 2-0 CT1 TAPERPNT 27 (SUTURE) ×1 IMPLANT
SUTURE STRATFX PDO 1 14 VIOLET (SUTURE) ×1 IMPLANT
SYR 50ML LL SCALE MARK (SYRINGE) ×2 IMPLANT
TOWER CARTRIDGE SMART MIX (DISPOSABLE) IMPLANT
TRAY FOLEY MTR SLVR 16FR STAT (SET/KITS/TRAYS/PACK) IMPLANT
TRAY FOLEY SLVR 16FR LF STAT (SET/KITS/TRAYS/PACK) ×2 IMPLANT
WATER STERILE IRR 1000ML POUR (IV SOLUTION) ×4 IMPLANT
WRAP KNEE MAXI GEL POST OP (GAUZE/BANDAGES/DRESSINGS) ×2 IMPLANT
YANKAUER SUCT BULB TIP 10FT TU (MISCELLANEOUS) ×2 IMPLANT

## 2018-03-15 NOTE — Care Plan (Signed)
Ortho Bundle Case Management Note  Patient Details  Name: KELLYJO EDGREN MRN: [id number] Date of Birth: 1955/05/05  L TKA scheduled on 03-15-18 DCP:  Home with spouse.  Lives in a 1 story home with 3 ste.   DME:  No needs.  Has a RW and 3-in-1 PT:  [facility]/Virtual PT                   DME Arranged:  N/A DME Agency:  NA  HH Arranged:  NA HH Agency:  NA  Additional Comments: Please contact me with any questions of if this plan should need to change.  Marianne Sofia, RN,CCM [facility]  [phone] 03/15/2018, 7:57 AM

## 2018-03-15 NOTE — Transfer of Care (Signed)
Immediate Anesthesia Transfer of Care Note  Patient: Tammy Sandoval  Procedure(s) Performed: COMPUTER ASSISTED TOTAL KNEE ARTHROPLASTY (Left )  Patient Location: PACU  Anesthesia Type:Spinal and MAC combined with regional for post-op pain  Level of Consciousness: awake, alert , oriented and patient cooperative  Airway & Oxygen Therapy: Patient Spontanous Breathing and Patient connected to face mask oxygen  Post-op Assessment: Report given to RN and Post -op Vital signs reviewed and stable  Post vital signs: Reviewed and stable  Last Vitals:  Vitals Value Taken Time  BP 104/66 03/15/2018 12:34 PM  Temp    Pulse 91 03/15/2018 12:38 PM  Resp 19 03/15/2018 12:38 PM  SpO2 100 % 03/15/2018 12:38 PM  Vitals shown include unvalidated device data.  Last Pain:  Vitals:   03/15/18 0818  TempSrc:   PainSc: 7          Complications: No apparent anesthesia complications

## 2018-03-15 NOTE — Op Note (Signed)
OPERATIVE REPORT  SURGEON: Rod Can, MD   ASSISTANT: Nehemiah Massed, PA-C.  PREOPERATIVE DIAGNOSIS: Left knee arthritis.   POSTOPERATIVE DIAGNOSIS: Left knee arthritis.   PROCEDURE: Left total knee arthroplasty.   IMPLANTS: Stryker Triathlon CR femur, size 4. Stryker Tritanium tibia, size 4. X3 polyethelyene insert, size 9 mm, CR. 3 button asymmetric patella, size 32 mm.  ANESTHESIA:  MAC, Regional and Spinal  TOURNIQUET TIME: Not utilized.   ESTIMATED BLOOD LOSS:-200 mL    ANTIBIOTICS: 2 g Ancef.  DRAINS: None.  COMPLICATIONS: None   CONDITION: PACU - hemodynamically stable.   BRIEF CLINICAL NOTE: Tammy Sandoval is a 62 y.o. female with a long-standing history of Left knee arthritis. After failing conservative management, the patient was indicated for total knee arthroplasty. The risks, benefits, and alternatives to the procedure were explained, and the patient elected to proceed.  PROCEDURE IN DETAIL: Adductor canal block was obtained in the pre-op holding area. Once inside the operative room, spinal anesthesia was obtained, and a foley catheter was inserted. The patient was then positioned, a nonsterile tourniquet was placed, and the lower extremity was prepped and draped in the normal sterile surgical fashion. A time-out was called verifying side and site of surgery. The patient received IV antibiotics within 60 minutes of beginning the procedure. The tourniquet was not utilized.  An anterior approach to the knee was performed utilizing a midvastus arthrotomy. A medial release was performed and the patellar fat pad was excised. Stryker navigation was used to cut the distal femur perpendicular to the mechanical axis. A freehand patellar resection was performed, and the patella was sized an prepared with 3 lug holes.  Nagivation was used to make a neutral proximal tibia  resection, taking 9 mm of bone from the less affected lateral side with 3 degrees of slope. The menisci were excised. A spacer block was placed, and the alignment and balance in extension were confirmed.   The distal femur was sized using the 3-degree external rotation guide referencing the posterior femoral cortex. The appropriate 4-in-1 cutting block was pinned into place. Rotation was checked using Whiteside's line, the epicondylar axis, and then confirmed with a spacer block in flexion. The remaining femoral cuts were performed, taking care to protect the MCL.  The tibia was sized and the trial tray was pinned into place. The remaining trail components were inserted. The knee was stable to varus and valgus stress through a full range of motion. The patella tracked centrally, and the PCL was well balanced. The trial components were removed, and the proximal tibial surface was prepared. Final components were impacted into place. The knee was tested for a final time and found to be well balanced.  The wound was copiously irrigated with normal saline with pulse lavage. Marcaine solution was injected into the periarticular soft tissue. The wound was closed in layers using #1 Vicryl and Stratafix for the fascia, 2-0 Vicryl for the subcutaneous fat, 2-0 Monocryl for the deep dermal layer, 3-0 running Monocryl subcuticular Stitch, and Dermabond for the skin. Once the glue was fully dried, an Aquacell Ag and compressive dressing were applied. Tthe patient was transported to the recovery room in stable condition. Sponge, needle, and instrument counts were correct at the end of the case x2. The patient tolerated the procedure well and there were no known complications.  Please note that a surgical assistant was a medical necessity for this procedure in order to perform it in a safe and expeditious manner. Surgical assistant was  necessary to retract the ligaments and vital neurovascular structures to prevent  injury to them and also necessary for proper positioning of the limb to allow for anatomic placement of the prosthesis.

## 2018-03-15 NOTE — Evaluation (Signed)
Physical Therapy Evaluation Patient Details Name: Tammy Sandoval MRN: [id number] DOB: 12-22-1955 Today's Date: 03/15/2018   History of Present Illness  Patient is a 62 y/o female admitted for L TKA.  Previous R TKA 2017, cervical radiculopathy s/p surgery, depression, venous insufficiency, IBS.  Clinical Impression  Patient presents with decreased independence with mobility due to pain, weakness and decreased AROM L LE.  She will benefit from skilled PT in the acute setting to allow return home with family support and follow up PT.     Follow Up Recommendations Supervision/Assistance - 24 hour;Follow surgeon's recommendation for DC plan and follow-up therapies    Equipment Recommendations  Other (comment)(would like a shower seat)    Recommendations for Other Services       Precautions / Restrictions Precautions Precautions: Fall;Knee Restrictions Weight Bearing Restrictions: No Other Position/Activity Restrictions: WBAT      Mobility  Bed Mobility Overal bed mobility: Needs Assistance Bed Mobility: Supine to Sit     Supine to sit: Min assist     General bed mobility comments: for L LE  Transfers Overall transfer level: Needs assistance Equipment used: Rolling walker (2 wheeled) Transfers: Sit to/from Stand Sit to Stand: Min guard         General transfer comment: for balance, cues for hand placement  Ambulation/Gait Ambulation/Gait assistance: Min guard;+2 safety/equipment Gait Distance (Feet): 40 Feet Assistive device: Rolling walker (2 wheeled) Gait Pattern/deviations: Step-to pattern;Decreased stride length;Antalgic     General Gait Details: assist for balance, chair following for safety and cues for sequence  Stairs            Wheelchair Mobility    Modified Rankin (Stroke Patients Only)       Balance Overall balance assessment: Needs assistance   Sitting balance-Leahy Scale: Good       Standing balance-Leahy Scale: Poor                               Pertinent Vitals/Pain Pain Assessment: 0-10 Pain Score: 8  Pain Location: Left knee Pain Descriptors / Indicators: Operative site guarding Pain Intervention(s): Monitored during session;Repositioned    Home Living Family/patient expects to be discharged to:: Private residence Living Arrangements: Spouse/significant other Available Help at Discharge: Family Type of Home: House Home Access: Stairs to enter Entrance Stairs-Rails: Can reach both Entrance Stairs-Number of Steps: 3 Home Layout: One level(sunken room with one step down) Home Equipment: Walker - 2 wheels;Bedside commode;Cane - single point      Prior Function Level of Independence: Independent with assistive device(s)         Comments: using cane PTA     Hand Dominance   Dominant Hand: Right    Extremity/Trunk Assessment   Upper Extremity Assessment Upper Extremity Assessment: Overall WFL for tasks assessed    Lower Extremity Assessment Lower Extremity Assessment: LLE deficits/detail LLE Deficits / Details: AAROM about 10-55, strength at least 3/5 hip flexion       Communication   Communication: No difficulties  Cognition Arousal/Alertness: Awake/alert Behavior During Therapy: WFL for tasks assessed/performed Overall Cognitive Status: Within Functional Limits for tasks assessed                                        General Comments      Exercises Total Joint Exercises Ankle Circles/Pumps: AROM;10 reps;Both;Supine Quad Sets:  AROM;5 reps;Left Heel Slides: AAROM;5 reps;Left;Supine   Assessment/Plan    PT Assessment Patient needs continued PT services  PT Problem List Decreased strength;Decreased mobility;Decreased knowledge of use of DME;Pain;Decreased activity tolerance;Decreased range of motion       PT Treatment Interventions Therapeutic activities;DME instruction;Patient/family education;Therapeutic exercise;Gait training;Functional  mobility training;Stair training    PT Goals (Current goals can be found in the Care Plan section)  Acute Rehab PT Goals Patient Stated Goal: to return to independent PT Goal Formulation: With patient Time For Goal Achievement: 03/18/18 Potential to Achieve Goals: Good    Frequency 7X/week   Barriers to discharge        Co-evaluation               AM-PAC PT "6 Clicks" Mobility  Outcome Measure Help needed turning from your back to your side while in a flat bed without using bedrails?: A Little Help needed moving from lying on your back to sitting on the side of a flat bed without using bedrails?: A Little Help needed moving to and from a bed to a chair (including a wheelchair)?: A Little Help needed standing up from a chair using your arms (e.g., wheelchair or bedside chair)?: A Little Help needed to walk in hospital room?: A Little Help needed climbing 3-5 steps with a railing? : A Little 6 Click Score: 18    End of Session Equipment Utilized During Treatment: Gait belt Activity Tolerance: Patient limited by fatigue Patient left: with call bell/phone within reach;in chair;with chair alarm set Nurse Communication: Mobility status PT Visit Diagnosis: Difficulty in walking, not elsewhere classified (R26.2);Pain Pain - Right/Left: Left Pain - part of body: Knee    Time: 3662-9476 PT Time Calculation (min) (ACUTE ONLY): 23 min   Charges:   PT Evaluation $PT Eval Low Complexity: 1 Low PT Treatments $Gait Training: 8-22 mins        [name], [place] [facility] [phone] 03/15/2018   [name] 03/15/2018, 5:39 PM

## 2018-03-15 NOTE — Anesthesia Preprocedure Evaluation (Addendum)
Anesthesia Evaluation  Patient identified by MRN, date of birth, ID band Patient awake    Reviewed: Allergy & Precautions, NPO status , Patient's Chart, lab work & pertinent test results  Airway Mallampati: III  TM Distance: >3 FB Neck ROM: Full    Dental  (+) Partial Upper   Pulmonary neg pulmonary ROS,    Pulmonary exam normal breath sounds clear to auscultation       Cardiovascular negative cardio ROS Normal cardiovascular exam Rhythm:Regular Rate:Normal     Neuro/Psych  Headaches, Seizures -, Well Controlled,  PSYCHIATRIC DISORDERS Anxiety Depression TIA Neuromuscular disease    GI/Hepatic Neg liver ROS, GERD  Medicated and Controlled,Irritable bowel syndrome   Endo/Other  negative endocrine ROS  Renal/GU negative Renal ROS     Musculoskeletal negative musculoskeletal ROS (+) Low back pain   Abdominal   Peds  Hematology negative hematology ROS (+)   Anesthesia Other Findings Degenerative joint disease left knee  Reproductive/Obstetrics                            Anesthesia Physical Anesthesia Plan  ASA: II  Anesthesia Plan: Spinal and Regional   Post-op Pain Management:    Induction: Intravenous  PONV Risk Score and Plan: 2 and Ondansetron, Dexamethasone, Midazolam and Treatment may vary due to age or medical condition  Airway Management Planned: Natural Airway  Additional Equipment:   Intra-op Plan:   Post-operative Plan:   Informed Consent: I have reviewed the patients History and Physical, chart, labs and discussed the procedure including the risks, benefits and alternatives for the proposed anesthesia with the patient or authorized representative who has indicated his/her understanding and acceptance.   Dental advisory given  Plan Discussed with: CRNA  Anesthesia Plan Comments:         Anesthesia Quick Evaluation

## 2018-03-15 NOTE — Discharge Summary (Signed)
Physician Discharge Summary  Patient ID: Tammy Sandoval MRN: [id number] DOB/AGE: Aug 23, 1955 62 y.o.  Admit date: 03/15/2018 Discharge date: 03/17/2018  Admission Diagnoses:  Osteoarthritis of left knee  Discharge Diagnoses:  Principal Problem:   Osteoarthritis of left knee   Past Medical History:  Diagnosis Date  . Anemia   . Anxiety   . Arthritis   . Cancer [facility])    skin-surgically removed  . Crescendo transient ischemic attacks    pt has no idea about this, has never had TIA's or strokes  . Depression   . Dyspnea    with exertion  . Generalized headaches   . GERD (gastroesophageal reflux disease)   . Insomnia   . Irritable bowel syndrome    mostly constipation  . Leg pain   . Low back pain   . Obesity   . Palpitations   . Pre-diabetes    hx of   . Restless leg   . Seizures ([place]) 2013   only one she has ever had, placed on neurontin  . Thrombophlebitis   . Ulcer    RLE  . Varicose veins     Surgeries: Procedure(s): COMPUTER ASSISTED TOTAL KNEE ARTHROPLASTY on 03/15/2018   Consultants (if any):   Discharged Condition: Improved  Hospital Course: Tammy Sandoval is an 62 y.o. female who was admitted 03/15/2018 with a diagnosis of Osteoarthritis of left knee and went to the operating room on 03/15/2018 and underwent the above named procedures.    She was given perioperative antibiotics:  Anti-infectives (From admission, onward)   Start     Dose/Rate Route Frequency Ordered Stop   03/15/18 1600  ceFAZolin (ANCEF) IVPB 2g/100 mL premix     2 g 200 mL/hr over 30 Minutes Intravenous Every 6 hours 03/15/18 1455 03/15/18 2152   03/15/18 0815  ceFAZolin (ANCEF) IVPB 2g/100 mL premix     2 g 200 mL/hr over 30 Minutes Intravenous On call to O.R. 03/15/18 0802 03/15/18 1043    .  She was given sequential compression devices, early ambulation, and apixaban for DVT prophylaxis.  She benefited maximally from the hospital stay and there were no complications.     Recent vital signs:  Vitals:   03/16/18 2330 03/17/18 0618  BP: (!) 151/79 119/60  Pulse: (!) 109 (!) 107  Resp: 16 16  Temp: 98.8 F (37.1 C) 98.9 F (37.2 C)  SpO2: 94% 92%    Recent laboratory studies:  Lab Results  Component Value Date   HGB 10.2 (L) 03/17/2018   HGB 9.9 (L) 03/16/2018   HGB 13.7 03/09/2018   Lab Results  Component Value Date   WBC 9.9 03/17/2018   PLT 276 03/17/2018   No results found for: INR Lab Results  Component Value Date   NA 140 03/16/2018   K 3.9 03/16/2018   CL 109 03/16/2018   CO2 27 03/16/2018   BUN 10 03/16/2018   CREATININE 0.60 03/16/2018   GLUCOSE 128 (H) 03/16/2018    Discharge Medications:   Allergies as of 03/17/2018      Reactions   Latex Swelling   Redness,swelling   Wellbutrin [bupropion Hcl] Itching, Other (See Comments)   HEAD TINGLES INTOLERANCE: MADE PT. LOOPY      Medication List    STOP taking these medications   diclofenac 75 MG EC tablet Commonly known as:  VOLTAREN   oxyCODONE 5 MG immediate release tablet Commonly known as:  Oxy IR/ROXICODONE     TAKE these medications  apixaban 2.5 MG Tabs tablet Commonly known as:  ELIQUIS Take 1 tablet (2.5 mg total) by mouth 2 (two) times daily.   ARIPiprazole 15 MG tablet Commonly known as:  ABILIFY Take 7.5 mg by mouth daily.   cycloSPORINE 0.05 % ophthalmic emulsion Commonly known as:  RESTASIS Place 1 drop into both eyes daily as needed (dry eyes).   docusate sodium 100 MG capsule Commonly known as:  COLACE Take 1 capsule (100 mg total) by mouth 2 (two) times daily.   gabapentin 600 MG tablet Commonly known as:  NEURONTIN Take 1,200-1,800 mg by mouth See admin instructions. Take 1200 mg by mouth in the morning and 1800 mg in the evening   HYDROmorphone 2 MG tablet Commonly known as:  DILAUDID Take 1-2 tablets (2-4 mg total) by mouth every 6 (six) hours as needed for severe pain.   omeprazole 40 MG capsule Commonly known as:   PRILOSEC Take 40 mg by mouth daily.   ondansetron 4 MG tablet Commonly known as:  ZOFRAN Take 1 tablet (4 mg total) by mouth every 8 (eight) hours as needed for nausea or vomiting.   senna 8.6 MG Tabs tablet Commonly known as:  SENOKOT Take 2 tablets (17.2 mg total) by mouth at bedtime.   trazodone 300 MG tablet Commonly known as:  DESYREL Take 300 mg by mouth at bedtime.   venlafaxine XR 150 MG 24 hr capsule Commonly known as:  EFFEXOR-XR Take 1 capsule (150 mg total) by mouth daily. What changed:  how much to take       Diagnostic Studies: Dg Knee Left Port  Result Date: 03/15/2018 CLINICAL DATA:  Status post left knee arthroplasty EXAM: PORTABLE LEFT KNEE - 2 VIEW COMPARISON:  None. FINDINGS: Left knee replacement is noted in satisfactory position. No acute bony or soft tissue abnormality is noted. IMPRESSION: Status post left knee replacement Electronically Signed   By: Inez Catalina M.D.   On: 03/15/2018 15:08    Disposition:     Follow-up Information    Rod Can, MD. Go on 03/28/2018.   Specialty:  Orthopedic Surgery Why:  You are scheduled for a post-op appointment with Dr. Lyla Glassing on 03-28-18 at 9:45 am. Contact information: [address] [place] [address] [phone]            Signed: Hilton Cork Swinteck 03/21/2018, 4:53 PM

## 2018-03-15 NOTE — Interval H&P Note (Signed)
History and Physical Interval Note:  03/15/2018 9:39 AM  Tammy Sandoval  has presented today for surgery, with the diagnosis of Degenerative joint disease left knee  The various methods of treatment have been discussed with the patient and family. After consideration of risks, benefits and other options for treatment, the patient has consented to  Procedure(s): COMPUTER ASSISTED TOTAL KNEE ARTHROPLASTY (Left) as a surgical intervention .  The patient's history has been reviewed, patient examined, no change in status, stable for surgery.  I have reviewed the patient's chart and labs.  Questions were answered to the patient's satisfaction.     Hilton Cork Swinteck

## 2018-03-15 NOTE — Anesthesia Procedure Notes (Signed)
Procedure Name: MAC Date/Time: 03/15/2018 10:07 AM Performed by: West Pugh, CRNA Pre-anesthesia Checklist: Patient identified, Emergency Drugs available, Suction available, Patient being monitored and Timeout performed Patient Re-evaluated:Patient Re-evaluated prior to induction Oxygen Delivery Method: Nasal cannula and Simple face mask Preoxygenation: Pre-oxygenation with 100% oxygen Induction Type: IV induction Placement Confirmation: positive ETCO2 Dental Injury: Teeth and Oropharynx as per pre-operative assessment

## 2018-03-15 NOTE — Anesthesia Postprocedure Evaluation (Signed)
Anesthesia Post Note  Patient: Tammy Sandoval  Procedure(s) Performed: COMPUTER ASSISTED TOTAL KNEE ARTHROPLASTY (Left )     Patient location during evaluation: PACU Anesthesia Type: Regional and Spinal Level of consciousness: oriented and awake and alert Pain management: pain level controlled Vital Signs Assessment: post-procedure vital signs reviewed and stable Respiratory status: spontaneous breathing, respiratory function stable and patient connected to nasal cannula oxygen Cardiovascular status: blood pressure returned to baseline and stable Postop Assessment: no headache, no backache, no apparent nausea or vomiting and spinal receding Anesthetic complications: no    Last Vitals:  Vitals:   03/15/18 1430 03/15/18 1451  BP: 103/61 118/78  Pulse: 92 98  Resp: 12 16  Temp:  (!) 36.3 C  SpO2: 95% 95%    Last Pain:  Vitals:   03/15/18 1451  TempSrc: Oral  PainSc:                  Ryan P Ellender

## 2018-03-15 NOTE — Anesthesia Procedure Notes (Signed)
Anesthesia Regional Block: Adductor canal block   Pre-Anesthetic Checklist: ,, timeout performed, Correct Patient, Correct Site, Correct Laterality, Correct Procedure,, site marked, risks and benefits discussed, Surgical consent,  Pre-op evaluation,  At surgeon's request and post-op pain management  Laterality: Left  Prep: chloraprep       Needles:  Injection technique: Single-shot  Needle Type: Echogenic Stimulator Needle     Needle Length: 9cm  Needle Gauge: 21     Additional Needles:   Procedures:,,,, ultrasound used (permanent image in chart),,,,  Narrative:  Start time: 03/15/2018 9:30 AM End time: 03/15/2018 9:40 AM Injection made incrementally with aspirations every 5 mL.  Performed by: Personally  Anesthesiologist: Murvin Natal, MD  Additional Notes: Functioning IV was confirmed and monitors were applied. A time-out was performed. Hand hygiene and sterile gloves were used. The thigh was placed in a frog-leg position and prepped in a sterile fashion. A 15m 21ga Arrow echogenic stimulator needle was placed using ultrasound guidance.  Negative aspiration and negative test dose prior to incremental administration of local anesthetic. The patient tolerated the procedure well.

## 2018-03-16 ENCOUNTER — Encounter (HOSPITAL_COMMUNITY): Payer: Self-pay

## 2018-03-16 LAB — CBC
HCT: 31.9 % — ABNORMAL LOW (ref 36.0–46.0)
Hemoglobin: 9.9 g/dL — ABNORMAL LOW (ref 12.0–15.0)
MCH: 29.9 pg (ref 26.0–34.0)
MCHC: 31 g/dL (ref 30.0–36.0)
MCV: 96.4 fL (ref 80.0–100.0)
Platelets: 311 10*3/uL (ref 150–400)
RBC: 3.31 MIL/uL — ABNORMAL LOW (ref 3.87–5.11)
RDW: 13.7 % (ref 11.5–15.5)
WBC: 11.1 10*3/uL — ABNORMAL HIGH (ref 4.0–10.5)
nRBC: 0 % (ref 0.0–0.2)

## 2018-03-16 LAB — BASIC METABOLIC PANEL
Anion gap: 4 — ABNORMAL LOW (ref 5–15)
BUN: 10 mg/dL (ref 8–23)
CO2: 27 mmol/L (ref 22–32)
Calcium: 7.8 mg/dL — ABNORMAL LOW (ref 8.9–10.3)
Chloride: 109 mmol/L (ref 98–111)
Creatinine, Ser: 0.6 mg/dL (ref 0.44–1.00)
GFR calc Af Amer: >60 mL/min (ref >60)
GFR calc non Af Amer: >60 mL/min (ref >60)
Glucose, Bld: 128 mg/dL — ABNORMAL HIGH (ref 70–99)
Potassium: 3.9 mmol/L (ref 3.5–5.1)
Sodium: 140 mmol/L (ref 135–145)

## 2018-03-16 NOTE — Progress Notes (Signed)
Physical Therapy Treatment Patient Details Name: Tammy Sandoval MRN: [id number] DOB: 11-Jan-1956 Today's Date: 03/16/2018    History of Present Illness Patient is a 62 y/o female admitted for L TKA.  Previous R TKA 2017, cervical radiculopathy s/p surgery, depression, venous insufficiency, IBS.    PT Comments    Patient progressing with ambulation this session and able to tolerate increased ROM in sitting with foot on floor.  Reports plans home tomorrow.  Will follow up in pm for stair training.   Follow Up Recommendations  Supervision/Assistance - 24 hour;Follow surgeon's recommendation for DC plan and follow-up therapies     Equipment Recommendations  Other (comment)(shower seat)    Recommendations for Other Services       Precautions / Restrictions Precautions Precautions: Fall;Knee Restrictions Weight Bearing Restrictions: No Other Position/Activity Restrictions: WBAT    Mobility  Bed Mobility               General bed mobility comments: up in chair  Transfers Overall transfer level: Needs assistance Equipment used: Rolling walker (2 wheeled) Transfers: Sit to/from Stand Sit to Stand: Supervision            Ambulation/Gait Ambulation/Gait assistance: Min guard Gait Distance (Feet): 100 Feet Assistive device: Rolling walker (2 wheeled) Gait Pattern/deviations: Step-to pattern;Antalgic;Decreased stride length;Trunk flexed;Shuffle     General Gait Details: cues for forward gaze, assist for balance   Stairs             Wheelchair Mobility    Modified Rankin (Stroke Patients Only)       Balance Overall balance assessment: Needs assistance   Sitting balance-Leahy Scale: Good       Standing balance-Leahy Scale: Poor                              Cognition Arousal/Alertness: Awake/alert Behavior During Therapy: WFL for tasks assessed/performed Overall Cognitive Status: Within Functional Limits for tasks assessed                                         Exercises Total Joint Exercises Ankle Circles/Pumps: AROM;10 reps;Both;Seated Quad Sets: AROM;10 reps;Seated;Both Heel Slides: AROM;AAROM;10 reps;Seated;Left(foot on floor) Hip ABduction/ADduction: AROM;10 reps;Seated;Left Straight Leg Raises: AROM;AAROM;10 reps;Left;Seated Goniometric ROM: 10-65    General Comments General comments (skin integrity, edema, etc.): toileted on BSC over toilet, assist for balance while pt performed hygiene      Pertinent Vitals/Pain Pain Assessment: Faces Faces Pain Scale: Hurts whole lot Pain Location: Left knee Pain Descriptors / Indicators: Operative site guarding;Sore;Moaning Pain Intervention(s): Monitored during session;Repositioned;Ice applied    Home Living                      Prior Function            PT Goals (current goals can now be found in the care plan section) Progress towards PT goals: Progressing toward goals    Frequency    7X/week      PT Plan Current plan remains appropriate    Co-evaluation              AM-PAC PT "6 Clicks" Mobility   Outcome Measure  Help needed turning from your back to your side while in a flat bed without using bedrails?: A Little Help needed moving from lying on your back to sitting  on the side of a flat bed without using bedrails?: A Little Help needed moving to and from a bed to a chair (including a wheelchair)?: A Little Help needed standing up from a chair using your arms (e.g., wheelchair or bedside chair)?: A Little Help needed to walk in hospital room?: A Little Help needed climbing 3-5 steps with a railing? : A Little 6 Click Score: 18    End of Session Equipment Utilized During Treatment: Gait belt Activity Tolerance: Patient tolerated treatment well Patient left: with call bell/phone within reach;in chair;with chair alarm set   PT Visit Diagnosis: Difficulty in walking, not elsewhere classified  (R26.2);Pain Pain - Right/Left: Left Pain - part of body: Knee     Time: 1027-1059 PT Time Calculation (min) (ACUTE ONLY): 32 min  Charges:  $Gait Training: 8-22 mins $Therapeutic Exercise: 8-22 mins                     [name], [place] [facility] [phone] 03/16/2018    [name] 03/16/2018, 11:39 AM

## 2018-03-16 NOTE — Progress Notes (Signed)
Physical Therapy Treatment Patient Details Name: Tammy Sandoval MRN: [id number] DOB: May 11, 1955 Today's Date: 03/16/2018    History of Present Illness Patient is a 62 y/o female admitted for L TKA.  Previous R TKA 2017, cervical radiculopathy s/p surgery, depression, venous insufficiency, IBS.    PT Comments    Patient progressing to negotiate steps this session.  Deferred exercises due to lunch arriving.  Will follow up in am prior to d/c home with family support.   Follow Up Recommendations  Supervision/Assistance - 24 hour;Follow surgeon's recommendation for DC plan and follow-up therapies     Equipment Recommendations  Other (comment)(shower seat)    Recommendations for Other Services       Precautions / Restrictions Precautions Precautions: Fall;Knee Restrictions Other Position/Activity Restrictions: WBAT    Mobility  Bed Mobility               General bed mobility comments: up in chair  Transfers Overall transfer level: Needs assistance Equipment used: Rolling walker (2 wheeled) Transfers: Sit to/from Stand Sit to Stand: Supervision            Ambulation/Gait Ambulation/Gait assistance: Min guard Gait Distance (Feet): 100 Feet Assistive device: Rolling walker (2 wheeled) Gait Pattern/deviations: Step-to pattern;Step-through pattern     General Gait Details: educated in step through sequence for increased knee mobility with ambulation, but pt with spasm on stairs R hamstring so educated to continue step to sequenc for now   Stairs Stairs: Yes   Stair Management: Forwards;Two rails;Step to pattern Number of Stairs: 3(x 2) General stair comments: sequence with cues for technique, assist for balance when muscle spasm on second trial   Wheelchair Mobility    Modified Rankin (Stroke Patients Only)       Balance Overall balance assessment: Needs assistance   Sitting balance-Leahy Scale: Good       Standing balance-Leahy Scale: Fair                               Cognition Arousal/Alertness: Awake/alert Behavior During Therapy: WFL for tasks assessed/performed Overall Cognitive Status: Within Functional Limits for tasks assessed                                        Exercises     General Comments General comments (skin integrity, edema, etc.): toileted on BSC over toilet, assist for balance while pt performed hygiene      Pertinent Vitals/Pain Pain Assessment: Faces Faces Pain Scale: Hurts even more Pain Location: Left knee Pain Descriptors / Indicators: Spasm;Operative site guarding;Aching Pain Intervention(s): Repositioned;Monitored during session;Ice applied    Home Living                      Prior Function            PT Goals (current goals can now be found in the care plan section) Progress towards PT goals: Progressing toward goals    Frequency    7X/week      PT Plan Current plan remains appropriate    Co-evaluation              AM-PAC PT "6 Clicks" Mobility   Outcome Measure  Help needed turning from your back to your side while in a flat bed without using bedrails?: A Little Help needed moving from lying on  your back to sitting on the side of a flat bed without using bedrails?: A Little Help needed moving to and from a bed to a chair (including a wheelchair)?: A Little Help needed standing up from a chair using your arms (e.g., wheelchair or bedside chair)?: A Little Help needed to walk in hospital room?: A Little Help needed climbing 3-5 steps with a railing? : A Little 6 Click Score: 18    End of Session Equipment Utilized During Treatment: Gait belt Activity Tolerance: Patient tolerated treatment well Patient left: with call bell/phone within reach;in chair;with chair alarm set;with family/visitor present   PT Visit Diagnosis: Difficulty in walking, not elsewhere classified (R26.2);Pain Pain - Right/Left: Left Pain - part of body:  Knee     Time: 8264-1583 PT Time Calculation (min) (ACUTE ONLY): 13 min  Charges:  $Gait Training: 8-22 mins                     [name], PT Acute Rehabilitation Services [phone] 03/16/2018    [name] 03/16/2018, 2:18 PM

## 2018-03-16 NOTE — Discharge Instructions (Signed)
Dr. Rod Can Total Joint Specialist [facility] [address], [place], [place] [address] [phone]  TOTAL KNEE REPLACEMENT POSTOPERATIVE DIRECTIONS    Knee Rehabilitation, Guidelines Following Surgery  Results after knee surgery are often greatly improved when you follow the exercise, range of motion and muscle strengthening exercises prescribed by your doctor. Safety measures are also important to protect the knee from further injury. Any time any of these exercises cause you to have increased pain or swelling in your knee joint, decrease the amount until you are comfortable again and slowly increase them. If you have problems or questions, call your caregiver or physical therapist for advice.   WEIGHT BEARING Weight bearing as tolerated with assist device (walker, cane, etc) as directed, use it as long as suggested by your surgeon or therapist, typically at least 4-6 weeks.  HOME CARE INSTRUCTIONS  Remove items at home which could result in a fall. This includes throw rugs or furniture in walking pathways.  Continue medications as instructed at time of discharge. You may have some home medications which will be placed on hold until you complete the course of blood thinner medication.  You may start showering once you are discharged home but do not submerge the incision under water. Just pat the incision dry and apply a dry gauze dressing on daily. Walk with walker as instructed.  You may resume a sexual relationship in one month or when given the OK by your doctor.   Use walker as long as suggested by your caregivers.  Avoid periods of inactivity such as sitting longer than an hour when not asleep. This helps prevent blood clots.  You may put full weight on your legs and walk as much as is comfortable.  You may return to work once you are cleared by your doctor.  Do not drive a car for 6 weeks or until released by you surgeon.   Do not drive  while taking narcotics.  Wear the elastic stockings for three weeks following surgery during the day but you may remove then at night. Make sure you keep all of your appointments after your operation with all of your doctors and caregivers. You should call the office at the above phone number and make an appointment for approximately two weeks after the date of your surgery. Do not remove your surgical dressing. The dressing is waterproof; you may take showers in 3 days, but do not take tub baths or submerge the dressing. Please pick up a stool softener and laxative for home use as long as you are requiring pain medications.  ICE to the affected knee every three hours for 30 minutes at a time and then as needed for pain and swelling.  Continue to use ice on the knee for pain and swelling from surgery. You may notice swelling that will progress down to the foot and ankle.  This is normal after surgery.  Elevate the leg when you are not up walking on it.   It is important for you to complete the blood thinner medication as prescribed by your doctor.  Continue to use the breathing machine which will help keep your temperature down.  It is common for your temperature to cycle up and down following surgery, especially at night when you are not up moving around and exerting yourself.  The breathing machine keeps your lungs expanded and your temperature down.  RANGE OF MOTION AND STRENGTHENING EXERCISES  Rehabilitation of the knee is important following  a knee injury or an operation. After just a few days of immobilization, the muscles of the thigh which control the knee become weakened and shrink (atrophy). Knee exercises are designed to build up the tone and strength of the thigh muscles and to improve knee motion. Often times heat used for twenty to thirty minutes before working out will loosen up your tissues and help with improving the range of motion but do not use heat for the first two weeks following  surgery. These exercises can be done on a training (exercise) mat, on the floor, on a table or on a bed. Use what ever works the best and is most comfortable for you Knee exercises include:  Leg Lifts - While your knee is still immobilized in a splint or cast, you can do straight leg raises. Lift the leg to 60 degrees, hold for 3 sec, and slowly lower the leg. Repeat 10-20 times 2-3 times daily. Perform this exercise against resistance later as your knee gets better.  Quad and Hamstring Sets - Tighten up the muscle on the front of the thigh (Quad) and hold for 5-10 sec. Repeat this 10-20 times hourly. Hamstring sets are done by pushing the foot backward against an object and holding for 5-10 sec. Repeat as with quad sets.  A rehabilitation program following serious knee injuries can speed recovery and prevent re-injury in the future due to weakened muscles. Contact your doctor or a physical therapist for more information on knee rehabilitation.   SKILLED REHAB INSTRUCTIONS: If the patient is transferred to a skilled rehab facility following release from the hospital, a list of the current medications will be sent to the facility for the patient to continue.  When discharged from the skilled rehab facility, please have the facility set up the patient's Horace prior to being released. Also, the skilled facility will be responsible for providing the patient with their medications at time of release from the facility to include their pain medication, the muscle relaxants, and their blood thinner medication. If the patient is still at the rehab facility at time of the two week follow up appointment, the skilled rehab facility will also need to assist the patient in arranging follow up appointment in our office and any transportation needs.  MAKE SURE YOU:  Understand these instructions.  Will watch your condition.  Will get help right away if you are not doing well or get worse.     Pick up stool softner and laxative for home use following surgery while on pain medications. Do NOT remove your dressing. You may shower.  Do not take tub baths or submerge incision under water. May shower starting three days after surgery. Please use a clean towel to pat the incision dry following showers. Continue to use ice for pain and swelling after surgery. Do not use any lotions or creams on the incision until instructed by your surgeon.   Information on my medicine - ELIQUIS (apixaban)  Why was Eliquis prescribed for you? Eliquis was prescribed for you to reduce the risk of blood clots forming after orthopedic surgery.    What do You need to know about Eliquis? Take your Eliquis TWICE DAILY - one tablet in the morning and one tablet in the evening with or without food.  It would be best to take the dose about the same time each day.  If you have difficulty swallowing the tablet whole please discuss with your pharmacist how to take the medication  safely.  Take Eliquis exactly as prescribed by your doctor and DO NOT stop taking Eliquis without talking to the doctor who prescribed the medication.  Stopping without other medication to take the place of Eliquis may increase your risk of developing a clot.  After discharge, you should have regular check-up appointments with your healthcare provider that is prescribing your Eliquis.  What do you do if you miss a dose? If a dose of ELIQUIS is not taken at the scheduled time, take it as soon as possible on the same day and twice-daily administration should be resumed.  The dose should not be doubled to make up for a missed dose.  Do not take more than one tablet of ELIQUIS at the same time.  Important Safety Information A possible side effect of Eliquis is bleeding. You should call your healthcare provider right away if you experience any of the following: ? Bleeding from an injury or your nose that does not  stop. ? Unusual colored urine (red or dark brown) or unusual colored stools (red or black). ? Unusual bruising for unknown reasons. ? A serious fall or if you hit your head (even if there is no bleeding).  Some medicines may interact with Eliquis and might increase your risk of bleeding or clotting while on Eliquis. To help avoid this, consult your healthcare provider or pharmacist prior to using any new prescription or non-prescription medications, including herbals, vitamins, non-steroidal anti-inflammatory drugs (NSAIDs) and supplements.  This website has more information on Eliquis (apixaban): http://www.eliquis.com/eliquis/home

## 2018-03-16 NOTE — Progress Notes (Signed)
Subjective: 1 Day Post-Op Procedure(s) (LRB): COMPUTER ASSISTED TOTAL KNEE ARTHROPLASTY (Left) Patient reports pain as well controled.  Mild to left knee. Reports a good night. Tolerating PO's. Denies SOB or CP. No fever or chills.  Objective: Vital signs in last 24 hours: Temp:  [97.4 F (36.3 C)-99 F (37.2 C)] 97.8 F (36.6 C) (11/28 0540) Pulse Rate:  [57-105] 95 (11/28 0540) Resp:  [12-31] 18 (11/28 0540) BP: (97-145)/(56-83) 114/75 (11/28 0540) SpO2:  [94 %-100 %] 100 % (11/28 0540)  Intake/Output from previous day: 11/27 0701 - 11/28 0700 In: 5088.6 [P.O.:1100; I.V.:3538.6; IV Piggyback:450] Out: 2400 [Urine:2200; Blood:200] Intake/Output this shift: No intake/output data recorded.  Recent Labs    03/16/18 0514  HGB 9.9*   Recent Labs    03/16/18 0514  WBC 11.1*  RBC 3.31*  HCT 31.9*  PLT 311   Recent Labs    03/16/18 0514  NA 140  K 3.9  CL 109  CO2 27  BUN 10  CREATININE 0.60  GLUCOSE 128*  CALCIUM 7.8*   No results for input(s): LABPT, INR in the last 72 hours.  Alert and oriented x3. RRR, Lungs clear, BS x4. Left Calf soft and non tender. L knee dressing C/D/I. No DVT signs. No signs of infection or compartment syndrome. LLE grossly neurovascularly intact.   Anticipated LOS equal to or greater than 2 midnights due to - Age 33 and older with one or more of the following:  - Obesity  - Expected need for hospital services (PT, OT, Nursing) required for safe  discharge  - Anticipated need for postoperative skilled nursing care or inpatient rehab  - Active co-morbidities: None OR   - Unanticipated findings during/Post Surgery: None  - Patient is a high risk of re-admission due to: None   Assessment/Plan: 1 Day Post-Op Procedure(s) (LRB): COMPUTER ASSISTED TOTAL KNEE ARTHROPLASTY (Left) Advance diet Up with therapy D/C IV fluids Plan for discharge tomorrow Discharge home with home health On Eliquis   STILWELL, BRYSON L 03/16/2018, 9:06  AM

## 2018-03-17 ENCOUNTER — Encounter (HOSPITAL_COMMUNITY): Payer: Self-pay | Admitting: Orthopedic Surgery

## 2018-03-17 LAB — CBC
HCT: 32 % — ABNORMAL LOW (ref 36.0–46.0)
Hemoglobin: 10.2 g/dL — ABNORMAL LOW (ref 12.0–15.0)
MCH: 30.7 pg (ref 26.0–34.0)
MCHC: 31.9 g/dL (ref 30.0–36.0)
MCV: 96.4 fL (ref 80.0–100.0)
Platelets: 276 10*3/uL (ref 150–400)
RBC: 3.32 MIL/uL — ABNORMAL LOW (ref 3.87–5.11)
RDW: 13.8 % (ref 11.5–15.5)
WBC: 9.9 10*3/uL (ref 4.0–10.5)
nRBC: 0 % (ref 0.0–0.2)

## 2018-03-17 NOTE — Progress Notes (Signed)
Physical Therapy Treatment Patient Details Name: Tammy Sandoval MRN: [id number] DOB: 1955/10/21 Today's Date: 03/17/2018    History of Present Illness Patient is a 62 y/o female admitted for L TKA.  Previous R TKA 2017, cervical radiculopathy s/p surgery, depression, venous insufficiency, IBS.    PT Comments    Pt ambulated 110' with RW, distance limited by pain. Reviewed TKA HEP with pt/spouse. From PT standpoint, pt is ready to DC home.   Follow Up Recommendations  Supervision/Assistance - 24 hour;Follow surgeon's recommendation for DC plan and follow-up therapies; pt plans to do virtual PT     Equipment Recommendations  Other (comment)(shower seat)    Recommendations for Other Services       Precautions / Restrictions Precautions Precautions: Fall;Knee Precaution Booklet Issued: Yes (comment) Precaution Comments: reviewed no pillow under knee Restrictions Weight Bearing Restrictions: No Other Position/Activity Restrictions: WBAT    Mobility  Bed Mobility               General bed mobility comments: up in chair  Transfers Overall transfer level: Needs assistance Equipment used: Rolling walker (2 wheeled) Transfers: Sit to/from Stand Sit to Stand: Supervision         General transfer comment: cues for hand placement  Ambulation/Gait Ambulation/Gait assistance: Supervision Gait Distance (Feet): 110 Feet Assistive device: Rolling walker (2 wheeled) Gait Pattern/deviations: Step-to pattern;Step-through pattern;Decreased weight shift to left Gait velocity: decr   General Gait Details: good sequencing, no loss of balance, distance limited by pain, VCs to lift head   Stairs             Wheelchair Mobility    Modified Rankin (Stroke Patients Only)       Balance Overall balance assessment: Needs assistance   Sitting balance-Leahy Scale: Good       Standing balance-Leahy Scale: Fair                              Cognition  Arousal/Alertness: Awake/alert Behavior During Therapy: WFL for tasks assessed/performed Overall Cognitive Status: Within Functional Limits for tasks assessed                                        Exercises Total Joint Exercises Ankle Circles/Pumps: AROM;10 reps;Both;Seated Quad Sets: AROM;10 reps;Seated;Both Short Arc Quad: AROM;Left;10 reps;Supine Heel Slides: AROM;AAROM;10 reps;Left;Supine(foot on floor) Hip ABduction/ADduction: AROM;10 reps;Seated;Left Straight Leg Raises: AROM;AAROM;10 reps;Left;Seated Long Arc Quad: AROM;Left;10 reps;Seated Knee Flexion: AAROM;Left;10 reps;Seated Goniometric ROM: 10-45* AAROM L knee    General Comments        Pertinent Vitals/Pain Pain Score: 7  Pain Location: Left knee Pain Descriptors / Indicators: Grimacing;Sore Pain Intervention(s): Limited activity within patient's tolerance;Monitored during session;Premedicated before session;Ice applied    Home Living                      Prior Function            PT Goals (current goals can now be found in the care plan section) Acute Rehab PT Goals Patient Stated Goal: hiking to [place]falls PT Goal Formulation: With patient/family Time For Goal Achievement: 03/18/18 Potential to Achieve Goals: Good Progress towards PT goals: Progressing toward goals    Frequency    7X/week      PT Plan Current plan remains appropriate    Co-evaluation  AM-PAC PT "6 Clicks" Mobility   Outcome Measure  Help needed turning from your back to your side while in a flat bed without using bedrails?: A Little Help needed moving from lying on your back to sitting on the side of a flat bed without using bedrails?: A Little Help needed moving to and from a bed to a chair (including a wheelchair)?: A Little Help needed standing up from a chair using your arms (e.g., wheelchair or bedside chair)?: A Little Help needed to walk in hospital room?: A Little Help  needed climbing 3-5 steps with a railing? : A Little 6 Click Score: 18    End of Session Equipment Utilized During Treatment: Gait belt Activity Tolerance: Patient tolerated treatment well;Patient limited by pain Patient left: with call bell/phone within reach;in chair;with family/visitor present   PT Visit Diagnosis: Difficulty in walking, not elsewhere classified (R26.2);Pain Pain - Right/Left: Left Pain - part of body: Knee     Time: 5974-7185 PT Time Calculation (min) (ACUTE ONLY): 34 min  Charges:  $Gait Training: 8-22 mins $Therapeutic Exercise: 8-22 mins                     Blondell Reveal Kistler PT 03/17/2018  Acute Rehabilitation Services Pager [phone] Office [phone]

## 2018-03-17 NOTE — Progress Notes (Signed)
   Subjective: 2 Days Post-Op Procedure(s) (LRB): COMPUTER ASSISTED TOTAL KNEE ARTHROPLASTY (Left) Patient reports pain as mild.   Patient seen in rounds for Dr. Lyla Glassing. Patient is well, and has had no acute complaints or problems other than pain in the left knee. States she is ready to go home. Denies chest pain, SOB, or calf pain. Voiding without difficulty and positive flatus. No issues overnight.  Plan is to go Home after hospital stay.  Objective: Vital signs in last 24 hours: Temp:  [98.2 F (36.8 C)-98.9 F (37.2 C)] 98.9 F (37.2 C) (11/29 0618) Pulse Rate:  [96-109] 107 (11/29 0618) Resp:  [16-18] 16 (11/29 0618) BP: (119-151)/(60-79) 119/60 (11/29 0618) SpO2:  [92 %-100 %] 92 % (11/29 0618)  Intake/Output from previous day:  Intake/Output Summary (Last 24 hours) at 03/17/2018 0857 Last data filed at 03/17/2018 0230 Gross per 24 hour  Intake 1734.23 ml  Output 450 ml  Net 1284.23 ml    Labs: Recent Labs    03/16/18 0514 03/17/18 0510  HGB 9.9* 10.2*   Recent Labs    03/16/18 0514 03/17/18 0510  WBC 11.1* 9.9  RBC 3.31* 3.32*  HCT 31.9* 32.0*  PLT 311 276   Recent Labs    03/16/18 0514  NA 140  K 3.9  CL 109  CO2 27  BUN 10  CREATININE 0.60  GLUCOSE 128*  CALCIUM 7.8*   Exam: General - Patient is Alert and Oriented Extremity - Neurologically intact Neurovascular intact Sensation intact distally Dorsiflexion/Plantar flexion intact Dressing/Incision - clean, dry, no drainage Motor Function - intact, moving foot and toes well on exam.   Past Medical History:  Diagnosis Date  . Anemia   . Anxiety   . Arthritis   . Cancer [facility])    skin-surgically removed  . Crescendo transient ischemic attacks    pt has no idea about this, has never had TIA's or strokes  . Depression   . Dyspnea    with exertion  . Generalized headaches   . GERD (gastroesophageal reflux disease)   . Insomnia   . Irritable bowel syndrome    mostly constipation  .  Leg pain   . Low back pain   . Obesity   . Palpitations   . Pre-diabetes    hx of   . Restless leg   . Seizures ([place]) 2013   only one she has ever had, placed on neurontin  . Thrombophlebitis   . Ulcer    RLE  . Varicose veins     Assessment/Plan: 2 Days Post-Op Procedure(s) (LRB): COMPUTER ASSISTED TOTAL KNEE ARTHROPLASTY (Left) Principal Problem:   Osteoarthritis of left knee  Estimated body mass index is 29.05 kg/m as calculated from the following:   Height as of this encounter: 5' 3"  (1.6 m).   Weight as of this encounter: 74.4 kg. Up with therapy D/C IV fluids  DVT Prophylaxis - Eliquis Weight-bearing as tolerated  Plan for discharge today around lunchtime after one session of physical therapy. Virtual physical therapy set up for post-op rehab. Follow-up in the office in 2 weeks with Dr. Lyla Glassing.  Theresa Duty, PA-C Orthopedic Surgery 03/17/2018, 8:57 AM

## 2018-03-28 DIAGNOSIS — Z96652 Presence of left artificial knee joint: Secondary | ICD-10-CM | POA: Diagnosis not present

## 2018-03-28 DIAGNOSIS — Z471 Aftercare following joint replacement surgery: Secondary | ICD-10-CM | POA: Diagnosis not present

## 2018-04-06 DIAGNOSIS — K219 Gastro-esophageal reflux disease without esophagitis: Secondary | ICD-10-CM | POA: Diagnosis not present

## 2018-04-06 DIAGNOSIS — F418 Other specified anxiety disorders: Secondary | ICD-10-CM | POA: Diagnosis not present

## 2018-04-06 DIAGNOSIS — D509 Iron deficiency anemia, unspecified: Secondary | ICD-10-CM | POA: Diagnosis not present

## 2018-04-06 DIAGNOSIS — E669 Obesity, unspecified: Secondary | ICD-10-CM | POA: Diagnosis not present

## 2018-04-06 DIAGNOSIS — J029 Acute pharyngitis, unspecified: Secondary | ICD-10-CM | POA: Diagnosis not present

## 2018-04-06 DIAGNOSIS — R7303 Prediabetes: Secondary | ICD-10-CM | POA: Diagnosis not present

## 2018-04-06 DIAGNOSIS — G4700 Insomnia, unspecified: Secondary | ICD-10-CM | POA: Diagnosis not present

## 2018-04-06 DIAGNOSIS — E782 Mixed hyperlipidemia: Secondary | ICD-10-CM | POA: Diagnosis not present

## 2018-04-06 DIAGNOSIS — G629 Polyneuropathy, unspecified: Secondary | ICD-10-CM | POA: Diagnosis not present

## 2018-04-06 DIAGNOSIS — K589 Irritable bowel syndrome without diarrhea: Secondary | ICD-10-CM | POA: Diagnosis not present

## 2018-04-21 DIAGNOSIS — M47812 Spondylosis without myelopathy or radiculopathy, cervical region: Secondary | ICD-10-CM | POA: Diagnosis not present

## 2018-04-24 DIAGNOSIS — F331 Major depressive disorder, recurrent, moderate: Secondary | ICD-10-CM | POA: Diagnosis not present

## 2018-04-24 DIAGNOSIS — F411 Generalized anxiety disorder: Secondary | ICD-10-CM | POA: Diagnosis not present

## 2018-04-28 DIAGNOSIS — Z471 Aftercare following joint replacement surgery: Secondary | ICD-10-CM | POA: Diagnosis not present

## 2018-04-28 DIAGNOSIS — Z96652 Presence of left artificial knee joint: Secondary | ICD-10-CM | POA: Diagnosis not present

## 2018-05-04 DIAGNOSIS — E669 Obesity, unspecified: Secondary | ICD-10-CM | POA: Diagnosis not present

## 2018-05-04 DIAGNOSIS — G629 Polyneuropathy, unspecified: Secondary | ICD-10-CM | POA: Diagnosis not present

## 2018-05-04 DIAGNOSIS — E782 Mixed hyperlipidemia: Secondary | ICD-10-CM | POA: Diagnosis not present

## 2018-05-04 DIAGNOSIS — D509 Iron deficiency anemia, unspecified: Secondary | ICD-10-CM | POA: Diagnosis not present

## 2018-05-04 DIAGNOSIS — F418 Other specified anxiety disorders: Secondary | ICD-10-CM | POA: Diagnosis not present

## 2018-05-04 DIAGNOSIS — Z131 Encounter for screening for diabetes mellitus: Secondary | ICD-10-CM | POA: Diagnosis not present

## 2018-05-04 DIAGNOSIS — K589 Irritable bowel syndrome without diarrhea: Secondary | ICD-10-CM | POA: Diagnosis not present

## 2018-05-04 DIAGNOSIS — R42 Dizziness and giddiness: Secondary | ICD-10-CM | POA: Diagnosis not present

## 2018-05-04 DIAGNOSIS — G4700 Insomnia, unspecified: Secondary | ICD-10-CM | POA: Diagnosis not present

## 2018-05-04 DIAGNOSIS — K219 Gastro-esophageal reflux disease without esophagitis: Secondary | ICD-10-CM | POA: Diagnosis not present

## 2018-05-04 DIAGNOSIS — R7303 Prediabetes: Secondary | ICD-10-CM | POA: Diagnosis not present

## 2018-05-08 DIAGNOSIS — F329 Major depressive disorder, single episode, unspecified: Secondary | ICD-10-CM | POA: Diagnosis not present

## 2018-05-08 DIAGNOSIS — F419 Anxiety disorder, unspecified: Secondary | ICD-10-CM | POA: Diagnosis not present

## 2018-05-08 DIAGNOSIS — M47812 Spondylosis without myelopathy or radiculopathy, cervical region: Secondary | ICD-10-CM | POA: Diagnosis not present

## 2018-05-08 DIAGNOSIS — Z85828 Personal history of other malignant neoplasm of skin: Secondary | ICD-10-CM | POA: Diagnosis not present

## 2018-05-08 DIAGNOSIS — M47816 Spondylosis without myelopathy or radiculopathy, lumbar region: Secondary | ICD-10-CM | POA: Diagnosis not present

## 2018-05-08 DIAGNOSIS — Z96651 Presence of right artificial knee joint: Secondary | ICD-10-CM | POA: Diagnosis not present

## 2018-05-08 DIAGNOSIS — Z79899 Other long term (current) drug therapy: Secondary | ICD-10-CM | POA: Diagnosis not present

## 2018-05-08 DIAGNOSIS — K219 Gastro-esophageal reflux disease without esophagitis: Secondary | ICD-10-CM | POA: Diagnosis not present

## 2018-05-08 DIAGNOSIS — M810 Age-related osteoporosis without current pathological fracture: Secondary | ICD-10-CM | POA: Diagnosis not present

## 2018-05-08 DIAGNOSIS — G894 Chronic pain syndrome: Secondary | ICD-10-CM | POA: Diagnosis not present

## 2018-05-08 DIAGNOSIS — Z888 Allergy status to other drugs, medicaments and biological substances status: Secondary | ICD-10-CM | POA: Diagnosis not present

## 2018-05-08 DIAGNOSIS — Z91040 Latex allergy status: Secondary | ICD-10-CM | POA: Diagnosis not present

## 2018-05-11 DIAGNOSIS — G629 Polyneuropathy, unspecified: Secondary | ICD-10-CM | POA: Diagnosis not present

## 2018-05-11 DIAGNOSIS — M4726 Other spondylosis with radiculopathy, lumbar region: Secondary | ICD-10-CM | POA: Diagnosis not present

## 2018-05-11 DIAGNOSIS — M545 Low back pain: Secondary | ICD-10-CM | POA: Diagnosis not present

## 2018-05-15 DIAGNOSIS — M47896 Other spondylosis, lumbar region: Secondary | ICD-10-CM | POA: Diagnosis not present

## 2018-05-15 DIAGNOSIS — M47816 Spondylosis without myelopathy or radiculopathy, lumbar region: Secondary | ICD-10-CM | POA: Diagnosis not present

## 2018-05-15 DIAGNOSIS — M5137 Other intervertebral disc degeneration, lumbosacral region: Secondary | ICD-10-CM | POA: Diagnosis not present

## 2018-05-15 DIAGNOSIS — M47897 Other spondylosis, lumbosacral region: Secondary | ICD-10-CM | POA: Diagnosis not present

## 2018-05-15 DIAGNOSIS — M47817 Spondylosis without myelopathy or radiculopathy, lumbosacral region: Secondary | ICD-10-CM | POA: Diagnosis not present

## 2018-05-15 DIAGNOSIS — M5136 Other intervertebral disc degeneration, lumbar region: Secondary | ICD-10-CM | POA: Diagnosis not present

## 2018-05-31 DIAGNOSIS — M545 Low back pain: Secondary | ICD-10-CM | POA: Diagnosis not present

## 2018-05-31 DIAGNOSIS — G629 Polyneuropathy, unspecified: Secondary | ICD-10-CM | POA: Diagnosis not present

## 2018-05-31 DIAGNOSIS — M4726 Other spondylosis with radiculopathy, lumbar region: Secondary | ICD-10-CM | POA: Diagnosis not present

## 2018-06-06 DIAGNOSIS — G894 Chronic pain syndrome: Secondary | ICD-10-CM | POA: Diagnosis not present

## 2018-06-06 DIAGNOSIS — M47812 Spondylosis without myelopathy or radiculopathy, cervical region: Secondary | ICD-10-CM | POA: Diagnosis not present

## 2018-06-19 DIAGNOSIS — Z85828 Personal history of other malignant neoplasm of skin: Secondary | ICD-10-CM | POA: Diagnosis not present

## 2018-06-19 DIAGNOSIS — F419 Anxiety disorder, unspecified: Secondary | ICD-10-CM | POA: Diagnosis not present

## 2018-06-19 DIAGNOSIS — Z79899 Other long term (current) drug therapy: Secondary | ICD-10-CM | POA: Diagnosis not present

## 2018-06-19 DIAGNOSIS — Z7902 Long term (current) use of antithrombotics/antiplatelets: Secondary | ICD-10-CM | POA: Diagnosis not present

## 2018-06-19 DIAGNOSIS — G2581 Restless legs syndrome: Secondary | ICD-10-CM | POA: Diagnosis not present

## 2018-06-19 DIAGNOSIS — F329 Major depressive disorder, single episode, unspecified: Secondary | ICD-10-CM | POA: Diagnosis not present

## 2018-06-19 DIAGNOSIS — M47812 Spondylosis without myelopathy or radiculopathy, cervical region: Secondary | ICD-10-CM | POA: Diagnosis not present

## 2018-06-19 DIAGNOSIS — K581 Irritable bowel syndrome with constipation: Secondary | ICD-10-CM | POA: Diagnosis not present

## 2018-06-19 DIAGNOSIS — D649 Anemia, unspecified: Secondary | ICD-10-CM | POA: Diagnosis not present

## 2018-06-19 DIAGNOSIS — K219 Gastro-esophageal reflux disease without esophagitis: Secondary | ICD-10-CM | POA: Diagnosis not present

## 2018-06-19 DIAGNOSIS — M1990 Unspecified osteoarthritis, unspecified site: Secondary | ICD-10-CM | POA: Diagnosis not present

## 2018-06-19 DIAGNOSIS — G629 Polyneuropathy, unspecified: Secondary | ICD-10-CM | POA: Diagnosis not present

## 2018-07-13 DIAGNOSIS — M4726 Other spondylosis with radiculopathy, lumbar region: Secondary | ICD-10-CM | POA: Diagnosis not present

## 2018-07-13 DIAGNOSIS — G629 Polyneuropathy, unspecified: Secondary | ICD-10-CM | POA: Diagnosis not present

## 2018-07-13 DIAGNOSIS — M545 Low back pain: Secondary | ICD-10-CM | POA: Diagnosis not present

## 2018-07-20 DIAGNOSIS — F411 Generalized anxiety disorder: Secondary | ICD-10-CM | POA: Diagnosis not present

## 2018-07-20 DIAGNOSIS — F331 Major depressive disorder, recurrent, moderate: Secondary | ICD-10-CM | POA: Diagnosis not present

## 2018-08-22 DIAGNOSIS — D509 Iron deficiency anemia, unspecified: Secondary | ICD-10-CM | POA: Diagnosis not present

## 2018-08-22 DIAGNOSIS — K589 Irritable bowel syndrome without diarrhea: Secondary | ICD-10-CM | POA: Diagnosis not present

## 2018-08-22 DIAGNOSIS — M542 Cervicalgia: Secondary | ICD-10-CM | POA: Diagnosis not present

## 2018-08-22 DIAGNOSIS — E782 Mixed hyperlipidemia: Secondary | ICD-10-CM | POA: Diagnosis not present

## 2018-08-22 DIAGNOSIS — K219 Gastro-esophageal reflux disease without esophagitis: Secondary | ICD-10-CM | POA: Diagnosis not present

## 2018-08-22 DIAGNOSIS — F418 Other specified anxiety disorders: Secondary | ICD-10-CM | POA: Diagnosis not present

## 2018-08-22 DIAGNOSIS — G629 Polyneuropathy, unspecified: Secondary | ICD-10-CM | POA: Diagnosis not present

## 2018-08-22 DIAGNOSIS — R7303 Prediabetes: Secondary | ICD-10-CM | POA: Diagnosis not present

## 2018-08-22 DIAGNOSIS — E669 Obesity, unspecified: Secondary | ICD-10-CM | POA: Diagnosis not present

## 2018-09-05 DIAGNOSIS — M7918 Myalgia, other site: Secondary | ICD-10-CM | POA: Diagnosis not present

## 2018-09-21 IMAGING — DX DG KNEE 1-2V PORT*R*
2 series · 2 of 2 positions shown · non-contrast
Comparison: 08/26/2013

CLINICAL DATA: Post Right Total Knee Arthroplasty.

EXAM:
PORTABLE RIGHT KNEE - 1-2 VIEW

[knee ap]
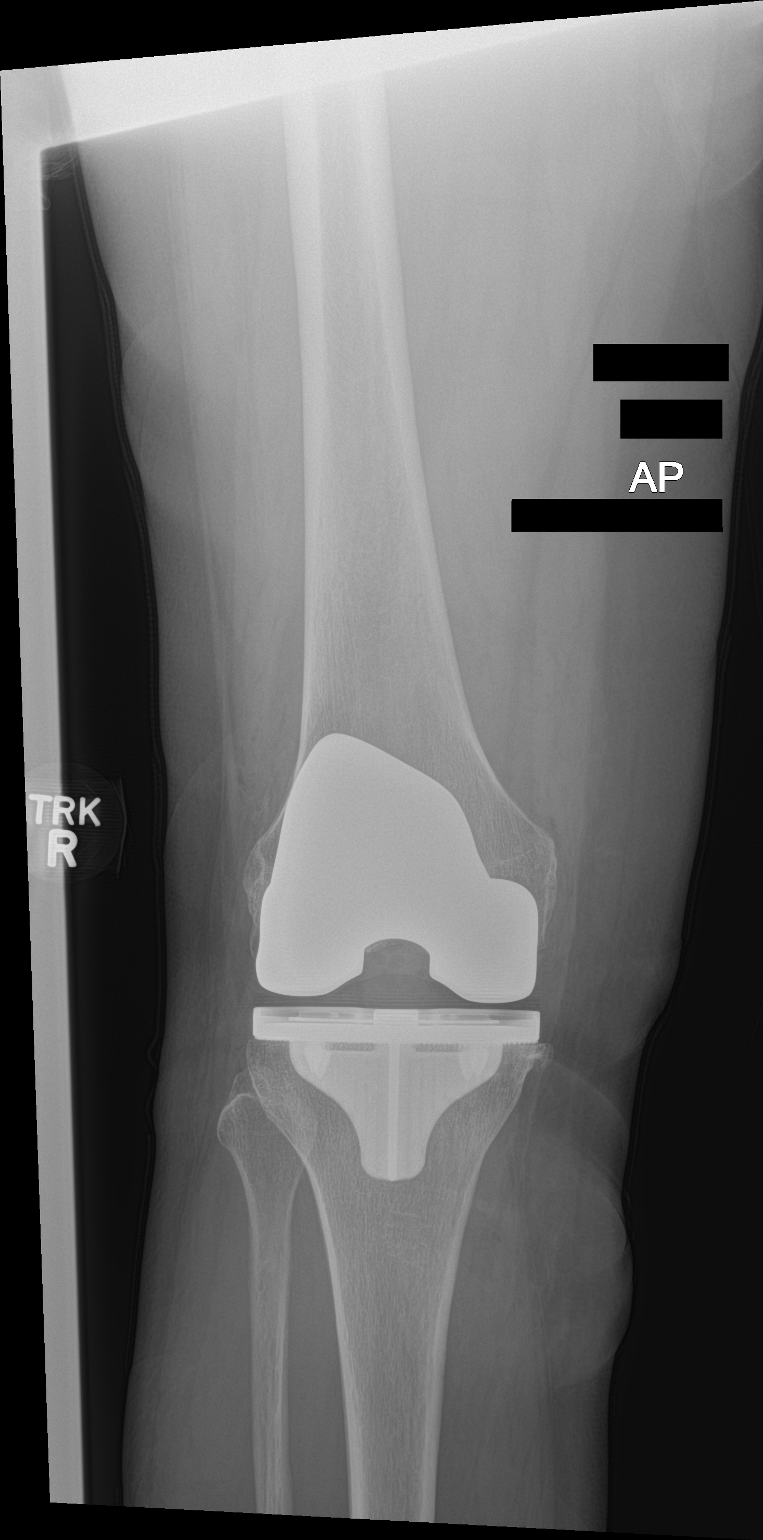

[knee lat]
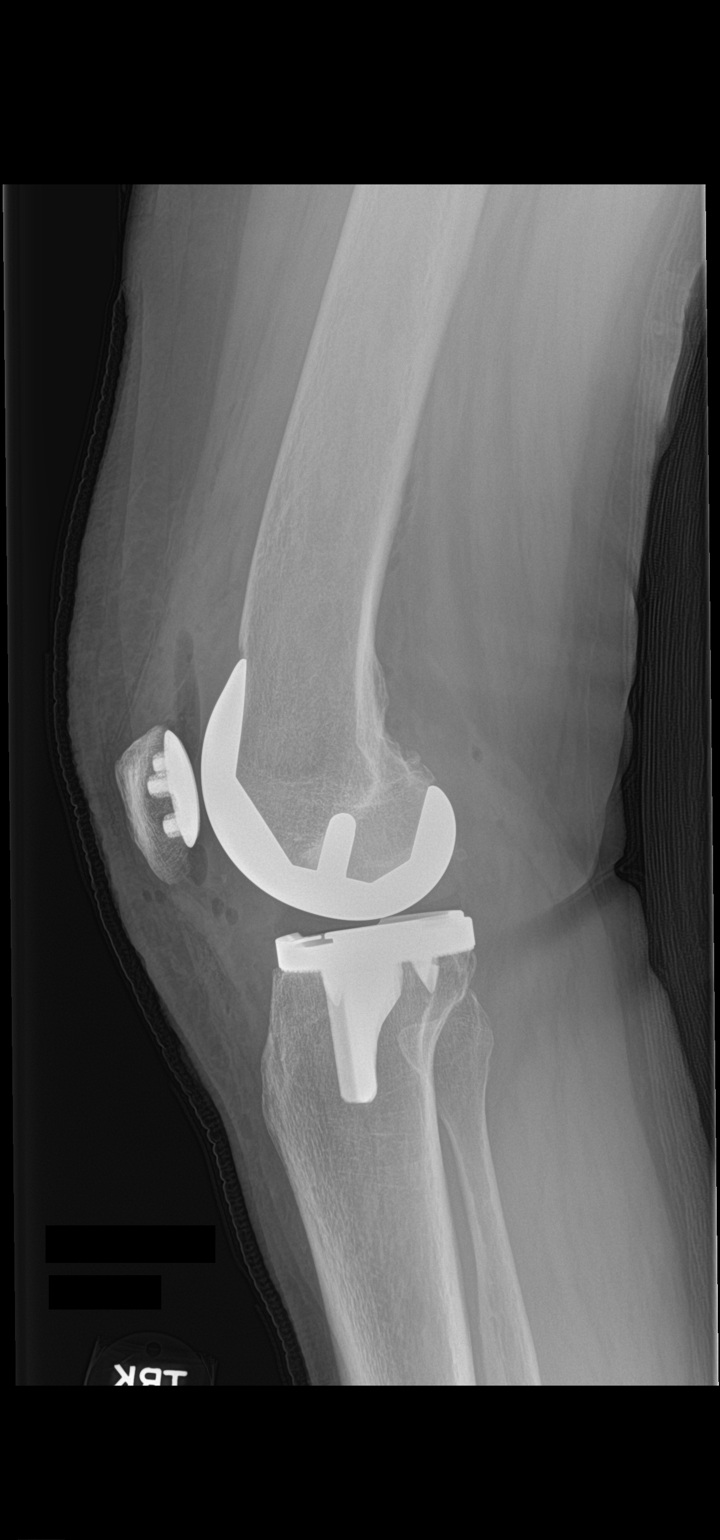

[2 of 2 positions shown; findings below may reference images not displayed]

FINDINGS: Components of interval knee arthroplasty project in expected
location. Negative for fracture or dislocation. Normal alignment.
IMPRESSION: 1. Right knee arthroplasty without apparent complication.

## 2018-10-03 DIAGNOSIS — G894 Chronic pain syndrome: Secondary | ICD-10-CM | POA: Diagnosis not present

## 2018-10-03 DIAGNOSIS — M7918 Myalgia, other site: Secondary | ICD-10-CM | POA: Diagnosis not present

## 2018-10-03 DIAGNOSIS — G8929 Other chronic pain: Secondary | ICD-10-CM | POA: Diagnosis not present

## 2018-10-03 DIAGNOSIS — M47816 Spondylosis without myelopathy or radiculopathy, lumbar region: Secondary | ICD-10-CM | POA: Diagnosis not present

## 2018-10-03 DIAGNOSIS — M47812 Spondylosis without myelopathy or radiculopathy, cervical region: Secondary | ICD-10-CM | POA: Diagnosis not present

## 2018-10-23 DIAGNOSIS — M542 Cervicalgia: Secondary | ICD-10-CM | POA: Diagnosis not present

## 2018-10-25 DIAGNOSIS — M542 Cervicalgia: Secondary | ICD-10-CM | POA: Diagnosis not present

## 2018-10-31 DIAGNOSIS — F331 Major depressive disorder, recurrent, moderate: Secondary | ICD-10-CM | POA: Diagnosis not present

## 2018-10-31 DIAGNOSIS — F411 Generalized anxiety disorder: Secondary | ICD-10-CM | POA: Diagnosis not present

## 2018-11-01 DIAGNOSIS — M542 Cervicalgia: Secondary | ICD-10-CM | POA: Diagnosis not present

## 2018-11-03 DIAGNOSIS — M542 Cervicalgia: Secondary | ICD-10-CM | POA: Diagnosis not present

## 2018-11-06 DIAGNOSIS — M542 Cervicalgia: Secondary | ICD-10-CM | POA: Diagnosis not present

## 2018-11-10 DIAGNOSIS — M542 Cervicalgia: Secondary | ICD-10-CM | POA: Diagnosis not present

## 2018-11-14 DIAGNOSIS — M542 Cervicalgia: Secondary | ICD-10-CM | POA: Diagnosis not present

## 2018-11-17 DIAGNOSIS — M542 Cervicalgia: Secondary | ICD-10-CM | POA: Diagnosis not present

## 2018-11-18 DIAGNOSIS — Z1231 Encounter for screening mammogram for malignant neoplasm of breast: Secondary | ICD-10-CM | POA: Diagnosis not present

## 2018-11-21 DIAGNOSIS — M542 Cervicalgia: Secondary | ICD-10-CM | POA: Diagnosis not present

## 2018-11-24 DIAGNOSIS — M542 Cervicalgia: Secondary | ICD-10-CM | POA: Diagnosis not present

## 2018-11-28 DIAGNOSIS — M542 Cervicalgia: Secondary | ICD-10-CM | POA: Diagnosis not present

## 2018-11-30 DIAGNOSIS — M542 Cervicalgia: Secondary | ICD-10-CM | POA: Diagnosis not present

## 2018-12-26 DIAGNOSIS — G8929 Other chronic pain: Secondary | ICD-10-CM | POA: Diagnosis not present

## 2018-12-26 DIAGNOSIS — M7918 Myalgia, other site: Secondary | ICD-10-CM | POA: Diagnosis not present

## 2019-01-25 DIAGNOSIS — F411 Generalized anxiety disorder: Secondary | ICD-10-CM | POA: Diagnosis not present

## 2019-01-25 DIAGNOSIS — F331 Major depressive disorder, recurrent, moderate: Secondary | ICD-10-CM | POA: Diagnosis not present

## 2019-02-05 DIAGNOSIS — M542 Cervicalgia: Secondary | ICD-10-CM | POA: Diagnosis not present

## 2019-02-05 DIAGNOSIS — M47812 Spondylosis without myelopathy or radiculopathy, cervical region: Secondary | ICD-10-CM | POA: Diagnosis not present

## 2019-02-05 DIAGNOSIS — M47816 Spondylosis without myelopathy or radiculopathy, lumbar region: Secondary | ICD-10-CM | POA: Diagnosis not present

## 2019-02-05 DIAGNOSIS — M7918 Myalgia, other site: Secondary | ICD-10-CM | POA: Diagnosis not present

## 2019-02-10 DIAGNOSIS — Z01818 Encounter for other preprocedural examination: Secondary | ICD-10-CM | POA: Diagnosis not present

## 2019-02-13 DIAGNOSIS — M1990 Unspecified osteoarthritis, unspecified site: Secondary | ICD-10-CM | POA: Diagnosis not present

## 2019-02-13 DIAGNOSIS — M47812 Spondylosis without myelopathy or radiculopathy, cervical region: Secondary | ICD-10-CM | POA: Diagnosis not present

## 2019-02-13 DIAGNOSIS — Z85828 Personal history of other malignant neoplasm of skin: Secondary | ICD-10-CM | POA: Diagnosis not present

## 2019-02-13 DIAGNOSIS — F419 Anxiety disorder, unspecified: Secondary | ICD-10-CM | POA: Diagnosis not present

## 2019-02-13 DIAGNOSIS — Z7902 Long term (current) use of antithrombotics/antiplatelets: Secondary | ICD-10-CM | POA: Diagnosis not present

## 2019-02-13 DIAGNOSIS — Z79899 Other long term (current) drug therapy: Secondary | ICD-10-CM | POA: Diagnosis not present

## 2019-02-13 DIAGNOSIS — Z9889 Other specified postprocedural states: Secondary | ICD-10-CM | POA: Diagnosis not present

## 2019-02-13 DIAGNOSIS — M8588 Other specified disorders of bone density and structure, other site: Secondary | ICD-10-CM | POA: Diagnosis not present

## 2019-02-13 DIAGNOSIS — D649 Anemia, unspecified: Secondary | ICD-10-CM | POA: Diagnosis not present

## 2019-02-13 DIAGNOSIS — M5136 Other intervertebral disc degeneration, lumbar region: Secondary | ICD-10-CM | POA: Diagnosis not present

## 2019-02-13 DIAGNOSIS — M542 Cervicalgia: Secondary | ICD-10-CM | POA: Diagnosis not present

## 2019-02-13 DIAGNOSIS — G8929 Other chronic pain: Secondary | ICD-10-CM | POA: Diagnosis not present

## 2019-02-17 DIAGNOSIS — Z01818 Encounter for other preprocedural examination: Secondary | ICD-10-CM | POA: Diagnosis not present

## 2019-02-20 DIAGNOSIS — Z79899 Other long term (current) drug therapy: Secondary | ICD-10-CM | POA: Diagnosis not present

## 2019-02-20 DIAGNOSIS — M47816 Spondylosis without myelopathy or radiculopathy, lumbar region: Secondary | ICD-10-CM | POA: Diagnosis not present

## 2019-02-20 DIAGNOSIS — Z888 Allergy status to other drugs, medicaments and biological substances status: Secondary | ICD-10-CM | POA: Diagnosis not present

## 2019-02-20 DIAGNOSIS — Z809 Family history of malignant neoplasm, unspecified: Secondary | ICD-10-CM | POA: Diagnosis not present

## 2019-02-20 DIAGNOSIS — Z7901 Long term (current) use of anticoagulants: Secondary | ICD-10-CM | POA: Diagnosis not present

## 2019-02-20 DIAGNOSIS — Z8249 Family history of ischemic heart disease and other diseases of the circulatory system: Secondary | ICD-10-CM | POA: Diagnosis not present

## 2019-02-20 DIAGNOSIS — K219 Gastro-esophageal reflux disease without esophagitis: Secondary | ICD-10-CM | POA: Diagnosis not present

## 2019-02-20 DIAGNOSIS — Z85828 Personal history of other malignant neoplasm of skin: Secondary | ICD-10-CM | POA: Diagnosis not present

## 2019-02-20 DIAGNOSIS — Z91040 Latex allergy status: Secondary | ICD-10-CM | POA: Diagnosis not present

## 2019-02-20 DIAGNOSIS — F418 Other specified anxiety disorders: Secondary | ICD-10-CM | POA: Diagnosis not present

## 2019-04-23 DIAGNOSIS — F411 Generalized anxiety disorder: Secondary | ICD-10-CM | POA: Diagnosis not present

## 2019-04-23 DIAGNOSIS — F331 Major depressive disorder, recurrent, moderate: Secondary | ICD-10-CM | POA: Diagnosis not present

## 2019-05-21 DIAGNOSIS — G8929 Other chronic pain: Secondary | ICD-10-CM | POA: Diagnosis not present

## 2019-05-21 DIAGNOSIS — M7918 Myalgia, other site: Secondary | ICD-10-CM | POA: Diagnosis not present

## 2019-06-10 ENCOUNTER — Other Ambulatory Visit: Payer: Self-pay

## 2019-06-10 ENCOUNTER — Emergency Department (HOSPITAL_COMMUNITY): Payer: Medicare HMO

## 2019-06-10 ENCOUNTER — Inpatient Hospital Stay (HOSPITAL_COMMUNITY)
Admission: EM | Admit: 2019-06-10 | Discharge: 2019-06-13 | DRG: 386 | Disposition: A | Discharge status: Home / Self Care | Payer: Medicare HMO | Attending: Internal Medicine | Admitting: Internal Medicine

## 2019-06-10 ENCOUNTER — Encounter (HOSPITAL_COMMUNITY): Payer: Self-pay | Admitting: Obstetrics and Gynecology

## 2019-06-10 DIAGNOSIS — D649 Anemia, unspecified: Secondary | ICD-10-CM | POA: Diagnosis present

## 2019-06-10 DIAGNOSIS — Z91040 Latex allergy status: Secondary | ICD-10-CM | POA: Diagnosis not present

## 2019-06-10 DIAGNOSIS — M1990 Unspecified osteoarthritis, unspecified site: Secondary | ICD-10-CM | POA: Diagnosis not present

## 2019-06-10 DIAGNOSIS — Z8249 Family history of ischemic heart disease and other diseases of the circulatory system: Secondary | ICD-10-CM

## 2019-06-10 DIAGNOSIS — Z79899 Other long term (current) drug therapy: Secondary | ICD-10-CM | POA: Diagnosis not present

## 2019-06-10 DIAGNOSIS — G2581 Restless legs syndrome: Secondary | ICD-10-CM | POA: Diagnosis not present

## 2019-06-10 DIAGNOSIS — Z860100 Personal history of colon polyps, unspecified: Secondary | ICD-10-CM

## 2019-06-10 DIAGNOSIS — R030 Elevated blood-pressure reading, without diagnosis of hypertension: Secondary | ICD-10-CM | POA: Diagnosis not present

## 2019-06-10 DIAGNOSIS — K559 Vascular disorder of intestine, unspecified: Secondary | ICD-10-CM | POA: Diagnosis present

## 2019-06-10 DIAGNOSIS — G8929 Other chronic pain: Secondary | ICD-10-CM | POA: Diagnosis present

## 2019-06-10 DIAGNOSIS — Z825 Family history of asthma and other chronic lower respiratory diseases: Secondary | ICD-10-CM

## 2019-06-10 DIAGNOSIS — K5730 Diverticulosis of large intestine without perforation or abscess without bleeding: Secondary | ICD-10-CM | POA: Diagnosis not present

## 2019-06-10 DIAGNOSIS — Z888 Allergy status to other drugs, medicaments and biological substances status: Secondary | ICD-10-CM | POA: Diagnosis not present

## 2019-06-10 DIAGNOSIS — R109 Unspecified abdominal pain: Secondary | ICD-10-CM | POA: Diagnosis not present

## 2019-06-10 DIAGNOSIS — Z8349 Family history of other endocrine, nutritional and metabolic diseases: Secondary | ICD-10-CM

## 2019-06-10 DIAGNOSIS — Z800 Family history of malignant neoplasm of digestive organs: Secondary | ICD-10-CM | POA: Diagnosis not present

## 2019-06-10 DIAGNOSIS — Z833 Family history of diabetes mellitus: Secondary | ICD-10-CM | POA: Diagnosis not present

## 2019-06-10 DIAGNOSIS — K219 Gastro-esophageal reflux disease without esophagitis: Secondary | ICD-10-CM | POA: Diagnosis present

## 2019-06-10 DIAGNOSIS — K5150 Left sided colitis without complications: Principal | ICD-10-CM | POA: Diagnosis present

## 2019-06-10 DIAGNOSIS — R933 Abnormal findings on diagnostic imaging of other parts of digestive tract: Secondary | ICD-10-CM | POA: Diagnosis not present

## 2019-06-10 DIAGNOSIS — F329 Major depressive disorder, single episode, unspecified: Secondary | ICD-10-CM | POA: Diagnosis present

## 2019-06-10 DIAGNOSIS — K921 Melena: Secondary | ICD-10-CM | POA: Diagnosis not present

## 2019-06-10 DIAGNOSIS — Z7901 Long term (current) use of anticoagulants: Secondary | ICD-10-CM

## 2019-06-10 DIAGNOSIS — Z20822 Contact with and (suspected) exposure to covid-19: Secondary | ICD-10-CM | POA: Diagnosis present

## 2019-06-10 DIAGNOSIS — K625 Hemorrhage of anus and rectum: Secondary | ICD-10-CM | POA: Diagnosis not present

## 2019-06-10 DIAGNOSIS — K529 Noninfective gastroenteritis and colitis, unspecified: Secondary | ICD-10-CM | POA: Diagnosis not present

## 2019-06-10 DIAGNOSIS — Z803 Family history of malignant neoplasm of breast: Secondary | ICD-10-CM

## 2019-06-10 DIAGNOSIS — Z8673 Personal history of transient ischemic attack (TIA), and cerebral infarction without residual deficits: Secondary | ICD-10-CM | POA: Diagnosis not present

## 2019-06-10 DIAGNOSIS — Z96653 Presence of artificial knee joint, bilateral: Secondary | ICD-10-CM | POA: Diagnosis not present

## 2019-06-10 DIAGNOSIS — G629 Polyneuropathy, unspecified: Secondary | ICD-10-CM | POA: Diagnosis present

## 2019-06-10 DIAGNOSIS — Z85828 Personal history of other malignant neoplasm of skin: Secondary | ICD-10-CM | POA: Diagnosis not present

## 2019-06-10 DIAGNOSIS — R1013 Epigastric pain: Secondary | ICD-10-CM | POA: Diagnosis not present

## 2019-06-10 DIAGNOSIS — Z8719 Personal history of other diseases of the digestive system: Secondary | ICD-10-CM | POA: Diagnosis not present

## 2019-06-10 DIAGNOSIS — R000 Tachycardia, unspecified: Secondary | ICD-10-CM | POA: Diagnosis not present

## 2019-06-10 DIAGNOSIS — G894 Chronic pain syndrome: Secondary | ICD-10-CM | POA: Diagnosis present

## 2019-06-10 LAB — COMPREHENSIVE METABOLIC PANEL
ALT: 29 U/L (ref 0–44)
AST: 22 U/L (ref 15–41)
Albumin: 3.8 g/dL (ref 3.5–5.0)
Alkaline Phosphatase: 94 U/L (ref 38–126)
Anion gap: 9 (ref 5–15)
BUN: 13 mg/dL (ref 8–23)
CO2: 26 mmol/L (ref 22–32)
Calcium: 8.8 mg/dL — ABNORMAL LOW (ref 8.9–10.3)
Chloride: 105 mmol/L (ref 98–111)
Creatinine, Ser: 0.93 mg/dL (ref 0.44–1.00)
GFR calc Af Amer: >60 mL/min (ref >60)
GFR calc non Af Amer: >60 mL/min (ref >60)
Glucose, Bld: 170 mg/dL — ABNORMAL HIGH (ref 70–99)
Potassium: 3.7 mmol/L (ref 3.5–5.1)
Sodium: 140 mmol/L (ref 135–145)
Total Bilirubin: 0.2 mg/dL — ABNORMAL LOW (ref 0.3–1.2)
Total Protein: 7.5 g/dL (ref 6.5–8.1)

## 2019-06-10 LAB — CBC
HCT: 42.5 % (ref 36.0–46.0)
Hemoglobin: 13.8 g/dL (ref 12.0–15.0)
MCH: 29.8 pg (ref 26.0–34.0)
MCHC: 32.5 g/dL (ref 30.0–36.0)
MCV: 91.8 fL (ref 80.0–100.0)
Platelets: 316 10*3/uL (ref 150–400)
RBC: 4.63 MIL/uL (ref 3.87–5.11)
RDW: 13.9 % (ref 11.5–15.5)
WBC: 12.9 10*3/uL — ABNORMAL HIGH (ref 4.0–10.5)
nRBC: 0 % (ref 0.0–0.2)

## 2019-06-10 LAB — LIPASE, BLOOD: Lipase: 23 U/L (ref 11–51)

## 2019-06-10 LAB — POC OCCULT BLOOD, ED: Fecal Occult Bld: POSITIVE — AB

## 2019-06-10 LAB — TYPE AND SCREEN
ABO/RH(D): AB POS
Antibody Screen: NEGATIVE

## 2019-06-10 LAB — LACTIC ACID, PLASMA: Lactic Acid, Venous: 1.2 mmol/L (ref 0.5–1.9)

## 2019-06-10 LAB — HEMOGLOBIN AND HEMATOCRIT, BLOOD
HCT: 37.2 % (ref 36.0–46.0)
Hemoglobin: 11.7 g/dL — ABNORMAL LOW (ref 12.0–15.0)

## 2019-06-10 LAB — PROTIME-INR
INR: 1 (ref 0.8–1.2)
Prothrombin Time: 13.1 seconds (ref 11.4–15.2)

## 2019-06-10 MED ORDER — ONDANSETRON HCL 4 MG/2ML IJ SOLN: 4.0000 mg | Freq: Four times a day (QID) | INTRAMUSCULAR | Status: DC | PRN

## 2019-06-10 MED ORDER — TIZANIDINE HCL 4 MG PO TABS
4.0000 mg | ORAL_TABLET | Freq: Three times a day (TID) | ORAL | Status: DC
  Administered 2019-06-10: 22:00:00 4 mg via ORAL
  Filled 2019-06-10: qty 1

## 2019-06-10 MED ORDER — METRONIDAZOLE IN NACL 5-0.79 MG/ML-% IV SOLN
500.0000 mg | Freq: Three times a day (TID) | INTRAVENOUS | Status: DC
  Administered 2019-06-10 – 2019-06-13 (×9): 500 mg via INTRAVENOUS
  Filled 2019-06-10 (×8): qty 100

## 2019-06-10 MED ORDER — SODIUM CHLORIDE (PF) 0.9 % IJ SOLN
INTRAMUSCULAR | Status: AC
  Filled 2019-06-10: qty 50

## 2019-06-10 MED ORDER — CIPROFLOXACIN IN D5W 400 MG/200ML IV SOLN
400.0000 mg | Freq: Two times a day (BID) | INTRAVENOUS | Status: DC
  Administered 2019-06-10 – 2019-06-13 (×6): 400 mg via INTRAVENOUS
  Filled 2019-06-10 (×6): qty 200

## 2019-06-10 MED ORDER — PANTOPRAZOLE SODIUM 40 MG IV SOLR
40.0000 mg | Freq: Once | INTRAVENOUS | Status: AC
  Administered 2019-06-10: 40 mg via INTRAVENOUS
  Filled 2019-06-10: qty 40

## 2019-06-10 MED ORDER — TRAZODONE HCL 100 MG PO TABS
300.0000 mg | ORAL_TABLET | Freq: Every day | ORAL | Status: DC
  Administered 2019-06-10 – 2019-06-12 (×3): 300 mg via ORAL
  Filled 2019-06-10 (×3): qty 3

## 2019-06-10 MED ORDER — ARIPIPRAZOLE 5 MG PO TABS
7.5000 mg | ORAL_TABLET | Freq: Every day | ORAL | Status: DC
  Administered 2019-06-11 – 2019-06-13 (×3): 7.5 mg via ORAL
  Filled 2019-06-10 (×3): qty 2

## 2019-06-10 MED ORDER — OXYCODONE-ACETAMINOPHEN 7.5-325 MG PO TABS
1.0000 | ORAL_TABLET | Freq: Four times a day (QID) | ORAL | Status: DC | PRN
  Administered 2019-06-10 – 2019-06-12 (×3): 1 via ORAL
  Filled 2019-06-10 (×4): qty 1

## 2019-06-10 MED ORDER — IOHEXOL 300 MG/ML  SOLN
100.0000 mL | Freq: Once | INTRAMUSCULAR | Status: AC | PRN
  Administered 2019-06-10: 15:00:00 100 mL via INTRAVENOUS

## 2019-06-10 MED ORDER — ACETAMINOPHEN 650 MG RE SUPP: 650.0000 mg | Freq: Four times a day (QID) | RECTAL | Status: DC | PRN

## 2019-06-10 MED ORDER — SODIUM CHLORIDE 0.9 % IV BOLUS
1000.0000 mL | Freq: Once | INTRAVENOUS | Status: AC
  Administered 2019-06-10: 17:00:00 1000 mL via INTRAVENOUS

## 2019-06-10 MED ORDER — METRONIDAZOLE IN NACL 5-0.79 MG/ML-% IV SOLN
500.0000 mg | Freq: Once | INTRAVENOUS | Status: DC
  Administered 2019-06-10: 500 mg via INTRAVENOUS
  Filled 2019-06-10: qty 100

## 2019-06-10 MED ORDER — VENLAFAXINE HCL ER 150 MG PO CP24
300.0000 mg | ORAL_CAPSULE | Freq: Every day | ORAL | Status: DC
  Administered 2019-06-11 – 2019-06-13 (×3): 300 mg via ORAL
  Filled 2019-06-10 (×3): qty 2

## 2019-06-10 MED ORDER — PANTOPRAZOLE SODIUM 40 MG IV SOLR
40.0000 mg | INTRAVENOUS | Status: DC
  Administered 2019-06-10 – 2019-06-12 (×3): 40 mg via INTRAVENOUS
  Filled 2019-06-10 (×4): qty 40

## 2019-06-10 MED ORDER — ONDANSETRON HCL 4 MG/2ML IJ SOLN
4.0000 mg | Freq: Once | INTRAMUSCULAR | Status: AC
  Administered 2019-06-10: 4 mg via INTRAVENOUS
  Filled 2019-06-10: qty 2

## 2019-06-10 MED ORDER — ACETAMINOPHEN 325 MG PO TABS: 650.0000 mg | ORAL_TABLET | Freq: Four times a day (QID) | ORAL | Status: DC | PRN

## 2019-06-10 MED ORDER — GABAPENTIN 300 MG PO CAPS
1800.0000 mg | ORAL_CAPSULE | Freq: Two times a day (BID) | ORAL | Status: DC
  Administered 2019-06-10 – 2019-06-13 (×6): 1800 mg via ORAL
  Filled 2019-06-10 (×6): qty 6

## 2019-06-10 MED ORDER — SODIUM CHLORIDE 0.9 % IV SOLN: INTRAVENOUS | Status: DC

## 2019-06-10 MED ORDER — MORPHINE SULFATE (PF) 4 MG/ML IV SOLN
4.0000 mg | Freq: Once | INTRAVENOUS | Status: AC
  Administered 2019-06-10: 4 mg via INTRAVENOUS
  Filled 2019-06-10: qty 1

## 2019-06-10 MED ORDER — SODIUM CHLORIDE 0.9 % IV SOLN
2.0000 g | Freq: Once | INTRAVENOUS | Status: AC
  Administered 2019-06-10: 2 g via INTRAVENOUS
  Filled 2019-06-10: qty 20

## 2019-06-10 MED ORDER — HYDROXYZINE HCL 25 MG PO TABS
25.0000 mg | ORAL_TABLET | Freq: Every day | ORAL | Status: DC
  Administered 2019-06-11 – 2019-06-13 (×3): 25 mg via ORAL
  Filled 2019-06-10 (×3): qty 1

## 2019-06-10 MED ORDER — ONDANSETRON HCL 4 MG PO TABS: 4.0000 mg | ORAL_TABLET | Freq: Four times a day (QID) | ORAL | Status: DC | PRN

## 2019-06-10 NOTE — ED Notes (Signed)
Report called for pt transfer to the floor

## 2019-06-10 NOTE — ED Provider Notes (Signed)
[facility] DEPT Provider Note   CSN: [id number] Arrival date & time: 06/10/19  1316     History Chief Complaint  Patient presents with  . Rectal Bleeding    ANNSLEIGH Sandoval is a 64 y.o. female.  HPI    64 y/o female with a h/o female with a h/o anemia, anxiety/depression, GERD, who presents to the ED today for eval of rectal bleeding that started this morning. She notes she has had bright red blood per rectum in the commode. She has had about 10-12 episodes since waking this AM. She also reports nausea and cramping abd pain that is constant in nature and rated 7/10. Pain radiates to her back. Denies vomiting.  Denies ETOH use. States she takes BC powder about twice per week. Denies other NSAID use. Denies recent abx use. Is not anticoagulated.  Past Medical History:  Diagnosis Date  . Anemia   . Anxiety   . Arthritis   . Cancer [facility])    skin-surgically removed  . [place] transient ischemic attacks    pt has no idea about this, has never had TIA's or strokes  . Depression   . Dyspnea    with exertion  . Generalized headaches   . GERD (gastroesophageal reflux disease)   . Insomnia   . Irritable bowel syndrome    mostly constipation  . Leg pain   . Low back pain   . Obesity   . Palpitations   . Pre-diabetes    hx of   . Restless leg   . Seizures ([place]) 2013   only one she has ever had, placed on neurontin  . Thrombophlebitis   . Ulcer    RLE  . Varicose veins     Patient Active Problem List   Diagnosis Date Noted  . Acute hemorrhagic colitis 06/10/2019  . Osteoarthritis of left knee 03/15/2018  . Osteoarthritis of right knee 02/23/2016  . Major depression 09/23/2015  . Osteoarthritis of spine with radiculopathy, cervical region 08/22/2015  . Varicose veins of bilateral lower extremities with other complications 78/24/2353  . Chronic venous insufficiency 08/13/2011    Past Surgical History:  Procedure Laterality Date  .  APPENDECTOMY    . BREAST LUMPECTOMY     bilateral  . CERVICAL DISC ARTHROPLASTY N/A 08/22/2015   Procedure: Cervical five - six Cervical six - seven arthroplasty;  Surgeon: Ashok Pall, MD;  Location: [facility]UR[facility];  Service: Neurosurgery;  Laterality: N/A;  C56 C67 arthroplasty  . CESAREAN SECTION    . COLONOSCOPY    . KNEE ARTHROPLASTY Right 02/23/2016   Procedure: COMPUTER ASSISTED TOTAL KNEE ARTHROPLASTY NAVIGATION;  Surgeon: Rod Can, MD;  Location: [facility];  Service: Orthopedics;  Laterality: Right;  . KNEE ARTHROPLASTY Left 03/15/2018   Procedure: COMPUTER ASSISTED TOTAL KNEE ARTHROPLASTY;  Surgeon: Rod Can, MD;  Location: [facility];  Service: Orthopedics;  Laterality: Left;  . LEG SURGERY  2004   mass removed on right leg  . skin cancer removal  2012 & 2013   2 places on face     OB History   No obstetric history on file.     Family History  Problem Relation Age of Onset  . Cancer Mother        pancreatic and breast  . Deep vein thrombosis Mother   . Diabetes Mother   . Heart disease Mother   . Other Mother        varicose veing  . Heart disease  Father   . Hyperlipidemia Father   . Hypertension Father   . Heart attack Father   . COPD Father   . AAA (abdominal aortic aneurysm) Father   . Diabetes Brother   . Heart attack Brother   . Heart disease Brother     Social History   Tobacco Use  . Smoking status: Never Smoker  . Smokeless tobacco: Never Used  Substance Use Topics  . Alcohol use: No  . Drug use: No    Home Medications Prior to Admission medications   Medication Sig Start Date End Date Taking? Authorizing Provider  ARIPiprazole (ABILIFY) 15 MG tablet Take 7.5 mg by mouth daily.  02/09/16  Yes [provider]  gabapentin (NEURONTIN) 600 MG tablet Take 1,200-1,800 mg by mouth See admin instructions. Take 1200 mg by mouth in the morning and 1800 mg in the evening 01/27/16  Yes [provider]  hydrOXYzine (ATARAX/VISTARIL) 25  MG tablet Take 25 mg by mouth daily. 04/23/19  Yes [provider]  omeprazole (PRILOSEC) 40 MG capsule Take 40 mg by mouth daily.   Yes [provider]  oxyCODONE-acetaminophen (PERCOCET) 7.5-325 MG tablet Take 1 tablet by mouth every 6 (six) hours as needed for pain. 06/06/18  Yes [provider]  tiZANidine (ZANAFLEX) 4 MG tablet Take 4 mg by mouth in the morning, at noon, and at bedtime.   Yes [provider]  trazodone (DESYREL) 300 MG tablet Take 300 mg by mouth at bedtime.  02/09/16  Yes [provider]  venlafaxine XR (EFFEXOR-XR) 150 MG 24 hr capsule Take 1 capsule (150 mg total) by mouth daily. Patient taking differently: Take 300 mg by mouth daily.  09/23/15  Yes Niel Hummer, NP  apixaban (ELIQUIS) 2.5 MG TABS tablet Take 1 tablet (2.5 mg total) by mouth 2 (two) times daily. Patient not taking: Reported on 06/10/2019 03/15/18   Rod Can, MD  docusate sodium (COLACE) 100 MG capsule Take 1 capsule (100 mg total) by mouth 2 (two) times daily. Patient not taking: Reported on 06/10/2019 03/15/18   Rod Can, MD  ondansetron (ZOFRAN) 4 MG tablet Take 1 tablet (4 mg total) by mouth every 8 (eight) hours as needed for nausea or vomiting. Patient not taking: Reported on 06/10/2019 03/15/18   Rod Can, MD  senna (SENOKOT) 8.6 MG TABS tablet Take 2 tablets (17.2 mg total) by mouth at bedtime. Patient not taking: Reported on 06/10/2019 03/15/18   Rod Can, MD    Allergies    Latex and Wellbutrin [bupropion hcl]  Review of Systems   Review of Systems  Constitutional: Negative for chills and fever.  HENT: Negative for ear pain and sore throat.   Eyes: Negative for visual disturbance.  Respiratory: Negative for cough and shortness of breath.   Cardiovascular: Negative for chest pain.  Gastrointestinal: Positive for abdominal pain, blood in stool and nausea. Negative for constipation, diarrhea and vomiting.  Genitourinary:  Negative for dysuria and hematuria.  Musculoskeletal: Negative for back pain.  Skin: Negative for rash.  Neurological: Negative for syncope and headaches.  All other systems reviewed and are negative.   Physical Exam Updated Vital Signs BP (!) 148/91   Pulse (!) 118   Temp 98.5 F (36.9 C) (Oral)   Resp (!) 22   Ht 5' 3"  (1.6 m)   Wt 82.1 kg   SpO2 96%   BMI 32.06 kg/m   Physical Exam Vitals and nursing note reviewed.  Constitutional:  General: She is not in acute distress.    Appearance: She is well-developed.  HENT:     Head: Normocephalic and atraumatic.  Eyes:     Conjunctiva/sclera: Conjunctivae normal.  Cardiovascular:     Rate and Rhythm: Regular rhythm. Tachycardia present.     Heart sounds: Normal heart sounds. No murmur.  Pulmonary:     Effort: Pulmonary effort is normal. No respiratory distress.     Breath sounds: Normal breath sounds. No wheezing, rhonchi or rales.  Abdominal:     General: Bowel sounds are normal.     Palpations: Abdomen is soft.     Tenderness: There is abdominal tenderness in the epigastric area, left upper quadrant and left lower quadrant. There is no guarding or rebound.  Genitourinary:    Comments: Chaperone present for DRE. No external hemorrhoids noted. No significant stool in the rectal vault and no obvious melena or bright red blood. Musculoskeletal:     Cervical back: Neck supple.  Skin:    General: Skin is warm and dry.  Neurological:     Mental Status: She is alert.     ED Results / Procedures / Treatments   Labs (all labs ordered are listed, but only abnormal results are displayed) Labs Reviewed  COMPREHENSIVE METABOLIC PANEL - Abnormal; Notable for the following components:      Result Value   Glucose, Bld 170 (*)    Calcium 8.8 (*)    Total Bilirubin 0.2 (*)    All other components within normal limits  CBC - Abnormal; Notable for the following components:   WBC 12.9 (*)    All other components within normal  limits  POC OCCULT BLOOD, ED - Abnormal; Notable for the following components:   Fecal Occult Bld POSITIVE (*)    All other components within normal limits  GI PATHOGEN PANEL BY PCR, STOOL  SARS CORONAVIRUS 2 (TAT 6-24 HRS)  LIPASE, BLOOD  TYPE AND SCREEN    EKG None  Radiology CT ABDOMEN PELVIS W CONTRAST  Result Date: 06/10/2019 CLINICAL DATA:  64 year old female with history of rectal bleeding. EXAM: CT ABDOMEN AND PELVIS WITH CONTRAST TECHNIQUE: Multidetector CT imaging of the abdomen and pelvis was performed using the standard protocol following bolus administration of intravenous contrast. CONTRAST:  181m OMNIPAQUE IOHEXOL 300 MG/ML  SOLN COMPARISON:  No priors. FINDINGS: Lower chest: Unremarkable. Hepatobiliary: No suspicious cystic or solid hepatic lesions. No intra or extrahepatic biliary ductal dilatation. Gallbladder is normal in appearance. Pancreas: No pancreatic mass. No pancreatic ductal dilatation. No pancreatic or peripancreatic fluid collections or inflammatory changes. Spleen: Unremarkable. Adrenals/Urinary Tract: Bilateral kidneys and adrenal glands are normal in appearance. No hydroureteronephrosis. Urinary bladder is normal in appearance. Stomach/Bowel: Normal appearance of the stomach. Duodenal diverticulum adjacent to the ampulla, without surrounding inflammatory changes. No pathologic dilatation of small bowel or colon. Areas of mural thickening, mucosal hyperenhancement and subtle surrounding inflammatory changes are noted in the colon, most evident extending from the splenic flexure to the distal descending colon, compatible with colitis. The appendix is not confidently identified and may be surgically absent. Regardless, there are no inflammatory changes noted adjacent to the cecum to suggest the presence of an acute appendicitis at this time. Vascular/Lymphatic: Aortic atherosclerosis, without evidence of aneurysm or dissection in the abdominal or pelvic vasculature. No  lymphadenopathy noted in the abdomen or pelvis. Reproductive: Uterus and ovaries are unremarkable in appearance. Other: No significant volume of ascites.  No pneumoperitoneum. Musculoskeletal: There are no aggressive appearing lytic  or blastic lesions noted in the visualized portions of the skeleton. IMPRESSION: 1. Findings are concerning for an acute colitis, as above. 2. Aortic atherosclerosis. Electronically Signed   By: Vinnie Langton M.D.   On: 06/10/2019 15:29    Procedures Procedures (including critical care time)  1:45 PM Cardiac monitoring reveals Sinus tachycardia at 114 (Rate & rhythm), as reviewed and interpreted by me. Cardiac monitoring was ordered due to sinus tachycardia and to monitor patient for dysrhythmia.   Medications Ordered in ED Medications  sodium chloride (PF) 0.9 % injection (has no administration in time range)  cefTRIAXone (ROCEPHIN) 2 g in sodium chloride 0.9 % 100 mL IVPB (2 g Intravenous New Bag/Given 06/10/19 1632)    And  metroNIDAZOLE (FLAGYL) IVPB 500 mg (has no administration in time range)  pantoprazole (PROTONIX) injection 40 mg (40 mg Intravenous Given 06/10/19 1403)  ondansetron (ZOFRAN) injection 4 mg (4 mg Intravenous Given 06/10/19 1403)  morphine 4 MG/ML injection 4 mg (4 mg Intravenous Given 06/10/19 1404)  iohexol (OMNIPAQUE) 300 MG/ML solution 100 mL (100 mLs Intravenous Contrast Given 06/10/19 1453)  sodium chloride 0.9 % bolus 1,000 mL (1,000 mLs Intravenous New Bag/Given (Non-Interop) 06/10/19 1633)    ED Course  I have reviewed the triage vital signs and the nursing notes.  Pertinent labs & imaging results that were available during my care of the patient were reviewed by me and considered in my medical decision making (see chart for details).    MDM Rules/Calculators/A&P                      64 year old female presenting for evaluation of rectal bleeding starting this morning.  Has had 10-12 episodes of bright red blood per rectum.   Seen at urgent care and transferred here for evaluation.  Reviewed records from urgent care. UA + for blood and hemoccult was + as well.   Reviewed labs CBC with mild leukocytosis, hgb notably normal CMP is nonacute Lipase wnl Hemoccult positive  CT abd/pelvis concerning for an acute colitis. Aortic atherosclerosis.   Pt given a dose of cipro, flagyl, and IVF. She was also given a dose of protonix due to initial concern for upper GI bleed. With patients advanced age, comorbidities, and >10 reported episodes of bloody stool PTA, will plan for admission for hemorraghic colitis.  4:32 PM CONSULT with Dr. [place] who accepts patient for admission  Final Clinical Impression(s) / ED Diagnoses Final diagnoses:  Hemorrhagic colitis    Rx / DC Orders ED Discharge Orders    None       Bishop [place] 06/10/19 1634    Davonna Belling, MD 06/10/19 2157

## 2019-06-10 NOTE — ED Triage Notes (Signed)
Pt reports she has been having rectal bleeding with abdominal pain. Patient was sent from urgent care for evaluation.

## 2019-06-10 NOTE — H&P (Signed)
History and Physical    Tammy Sandoval ZYS:[id number] DOB: 04-Apr-1956 DOA: 06/10/2019  PCP: [name], MD  Patient coming from: Home  I have personally briefly reviewed patient's old medical records in [place]  Chief Complaint: Rectal bleeding, abdominal pain  HPI: Tammy Sandoval is a 64 y.o. female with medical history significant of depression, chronic pain syndrome, GERD, and history of colonic polyp presented to ED with acute onset bright red blood per rectum this morning.  Patient reports 10-12 episodes since onset, associated with crampy generalized abdominal discomfort, worse in the epigastric region.  Denies any nausea, vomiting, or diarrhea, no recent sick contacts, no NSAID abuse and no changes in her home medications.  Patient denies any history use of anticoagulants/antiplatelets.  Denies recent antibiotic use.  Reports previous colonoscopy with polyp removed.  No other specific complaints at this time.  Denies headache, no visual changes, no chest pain, palpitations, known shortness of breath, no weakness, no paresthesias.  ED Course: Temperature 98.5, HR 118, RR 22, BP 148/91, SPO2 96% on room air.  The BC count 12.9, hemoglobin 13.8 (10.2 03/17/2018), platelets 316.  Sodium 140, potassium 3.7, chloride 105, CO2 26, BUN 13, creatinine 0.93, glucose 170.  Lipase 23.  FOBT positive.  CT abdomen/pelvis with inflammatory changes splenic flexure to distal descending colon consistent with acute colitis.  EKG with sinus tachycardia, rate 111, QTc 521, no concerning ST elevation/depression or T wave inversion.  Patient was started on ceftriaxone, Flagyl.  Given morphine 4 mg IV, Zofran, Protonix and a 1 L NS bolus.  EDP referred patient for admission for acute GI bleed in the setting of active colitis.  Review of Systems: As per HPI otherwise 10 point review of systems negative.    Past Medical History:  Diagnosis Date  . Anemia   . Anxiety   . Arthritis   . Cancer  [facility])    skin-surgically removed  . Crescendo transient ischemic attacks    pt has no idea about this, has never had TIA's or strokes  . Depression   . Dyspnea    with exertion  . Generalized headaches   . GERD (gastroesophageal reflux disease)   . Insomnia   . Irritable bowel syndrome    mostly constipation  . Leg pain   . Low back pain   . Obesity   . Palpitations   . Pre-diabetes    hx of   . Restless leg   . Seizures ([place]) 2013   only one she has ever had, placed on neurontin  . Thrombophlebitis   . Ulcer    RLE  . Varicose veins     Past Surgical History:  Procedure Laterality Date  . APPENDECTOMY    . BREAST LUMPECTOMY     bilateral  . CERVICAL DISC ARTHROPLASTY N/A 08/22/2015   Procedure: Cervical five - six Cervical six - seven arthroplasty;  Surgeon: Ashok Pall, MD;  Location: [place];  Service: Neurosurgery;  Laterality: N/A;  C56 C67 arthroplasty  . CESAREAN SECTION    . COLONOSCOPY    . KNEE ARTHROPLASTY Right 02/23/2016   Procedure: COMPUTER ASSISTED TOTAL KNEE ARTHROPLASTY NAVIGATION;  Surgeon: Rod Can, MD;  Location: [facility];  Service: Orthopedics;  Laterality: Right;  . KNEE ARTHROPLASTY Left 03/15/2018   Procedure: COMPUTER ASSISTED TOTAL KNEE ARTHROPLASTY;  Surgeon: Rod Can, MD;  Location: [facility];  Service: Orthopedics;  Laterality: Left;  . LEG SURGERY  2004   mass removed on right  leg  . skin cancer removal  2012 & 2013   2 places on face     reports that she has never smoked. She has never used smokeless tobacco. She reports that she does not drink alcohol or use drugs.  Allergies  Allergen Reactions  . Latex Swelling    Redness,swelling  . Wellbutrin [Bupropion Hcl] Itching and Other (See Comments)    HEAD TINGLES INTOLERANCE: MADE PT. LOOPY    Family History  Problem Relation Age of Onset  . Cancer Mother        pancreatic and breast  . Deep vein thrombosis Mother   . Diabetes Mother   . Heart disease Mother   .  Other Mother        varicose veing  . Heart disease Father   . Hyperlipidemia Father   . Hypertension Father   . Heart attack Father   . COPD Father   . AAA (abdominal aortic aneurysm) Father   . Diabetes Brother   . Heart attack Brother   . Heart disease Brother     Family history reviewed and not pertinent   Prior to Admission medications   Medication Sig Start Date End Date Taking? Authorizing Provider  ARIPiprazole (ABILIFY) 15 MG tablet Take 7.5 mg by mouth daily.  02/09/16  Yes [provider]  gabapentin (NEURONTIN) 600 MG tablet Take 1,200-1,800 mg by mouth See admin instructions. Take 1200 mg by mouth in the morning and 1800 mg in the evening 01/27/16  Yes [provider]  hydrOXYzine (ATARAX/VISTARIL) 25 MG tablet Take 25 mg by mouth daily. 04/23/19  Yes [provider]  omeprazole (PRILOSEC) 40 MG capsule Take 40 mg by mouth daily.   Yes [provider]  oxyCODONE-acetaminophen (PERCOCET) 7.5-325 MG tablet Take 1 tablet by mouth every 6 (six) hours as needed for pain. 06/06/18  Yes [provider]  tiZANidine (ZANAFLEX) 4 MG tablet Take 4 mg by mouth in the morning, at noon, and at bedtime.   Yes [provider]  trazodone (DESYREL) 300 MG tablet Take 300 mg by mouth at bedtime.  02/09/16  Yes [provider]  venlafaxine XR (EFFEXOR-XR) 150 MG 24 hr capsule Take 1 capsule (150 mg total) by mouth daily. Patient taking differently: Take 300 mg by mouth daily.  09/23/15  Yes Niel Hummer, NP  apixaban (ELIQUIS) 2.5 MG TABS tablet Take 1 tablet (2.5 mg total) by mouth 2 (two) times daily. Patient not taking: Reported on 06/10/2019 03/15/18   Rod Can, MD  docusate sodium (COLACE) 100 MG capsule Take 1 capsule (100 mg total) by mouth 2 (two) times daily. Patient not taking: Reported on 06/10/2019 03/15/18   Rod Can, MD  ondansetron (ZOFRAN) 4 MG tablet Take 1 tablet (4 mg total) by mouth every 8 (eight)  hours as needed for nausea or vomiting. Patient not taking: Reported on 06/10/2019 03/15/18   Rod Can, MD  senna (SENOKOT) 8.6 MG TABS tablet Take 2 tablets (17.2 mg total) by mouth at bedtime. Patient not taking: Reported on 06/10/2019 03/15/18   Rod Can, MD    Physical Exam: Vitals:   06/10/19 1327 06/10/19 1530  BP: (!) 166/91 (!) 148/91  Pulse: (!) 114 (!) 118  Resp: 16 (!) 22  Temp: 98.5 F (36.9 C)   TempSrc: Oral   SpO2: 99% 96%  Weight: 82.1 kg   Height: 5' 3"  (1.6 m)     Constitutional: NAD, calm, comfortable Eyes: PERRL, lids and conjunctivae  normal ENMT: Mucous membranes are moist. Posterior pharynx clear of any exudate or lesions.Normal dentition.  Neck: normal, supple, no masses, no thyromegaly Respiratory: clear to auscultation bilaterally, no wheezing, no crackles. Normal respiratory effort. No accessory muscle use.  Cardiovascular: Regular rate and rhythm, no murmurs / rubs / gallops. No extremity edema. 2+ pedal pulses. No carotid bruits.  Abdomen: mild diffuse abd tenderness; worse in the epigastic region, no masses palpated. No hepatosplenomegaly. Bowel sounds positive.  Musculoskeletal: no clubbing / cyanosis. No joint deformity upper and lower extremities. Good ROM, no contractures. Normal muscle tone.  Skin: no rashes, lesions, ulcers. No induration Neurologic: CN 2-12 grossly intact. Sensation intact, DTR normal. Strength 5/5 in all 4.  Psychiatric: Normal judgment and insight. Alert and oriented x 3. Normal mood.    Labs on Admission: I have personally reviewed following labs and imaging studies  CBC: Recent Labs  Lab 06/10/19 1340  WBC 12.9*  HGB 13.8  HCT 42.5  MCV 91.8  PLT 292   Basic Metabolic Panel: Recent Labs  Lab 06/10/19 1340  NA 140  K 3.7  CL 105  CO2 26  GLUCOSE 170*  BUN 13  CREATININE 0.93  CALCIUM 8.8*   GFR: Estimated Creatinine Clearance: 62.8 mL/min (by C-G formula based on SCr of 0.93 mg/dL). Liver  Function Tests: Recent Labs  Lab 06/10/19 1340  AST 22  ALT 29  ALKPHOS 94  BILITOT 0.2*  PROT 7.5  ALBUMIN 3.8   Recent Labs  Lab 06/10/19 1340  LIPASE 23   No results for input(s): AMMONIA in the last 168 hours. Coagulation Profile: No results for input(s): INR, PROTIME in the last 168 hours. Cardiac Enzymes: No results for input(s): CKTOTAL, CKMB, CKMBINDEX, TROPONINI in the last 168 hours. BNP (last 3 results) No results for input(s): PROBNP in the last 8760 hours. HbA1C: No results for input(s): HGBA1C in the last 72 hours. CBG: No results for input(s): GLUCAP in the last 168 hours. Lipid Profile: No results for input(s): CHOL, HDL, LDLCALC, TRIG, CHOLHDL, LDLDIRECT in the last 72 hours. Thyroid Function Tests: No results for input(s): TSH, T4TOTAL, FREET4, T3FREE, THYROIDAB in the last 72 hours. Anemia Panel: No results for input(s): VITAMINB12, FOLATE, FERRITIN, TIBC, IRON, RETICCTPCT in the last 72 hours. Urine analysis: No results found for: COLORURINE, APPEARANCEUR, LABSPEC, PHURINE, GLUCOSEU, HGBUR, BILIRUBINUR, KETONESUR, PROTEINUR, UROBILINOGEN, NITRITE, LEUKOCYTESUR  Radiological Exams on Admission: CT ABDOMEN PELVIS W CONTRAST  Result Date: 06/10/2019 CLINICAL DATA:  65 year old female with history of rectal bleeding. EXAM: CT ABDOMEN AND PELVIS WITH CONTRAST TECHNIQUE: Multidetector CT imaging of the abdomen and pelvis was performed using the standard protocol following bolus administration of intravenous contrast. CONTRAST:  191m OMNIPAQUE IOHEXOL 300 MG/ML  SOLN COMPARISON:  No priors. FINDINGS: Lower chest: Unremarkable. Hepatobiliary: No suspicious cystic or solid hepatic lesions. No intra or extrahepatic biliary ductal dilatation. Gallbladder is normal in appearance. Pancreas: No pancreatic mass. No pancreatic ductal dilatation. No pancreatic or peripancreatic fluid collections or inflammatory changes. Spleen: Unremarkable. Adrenals/Urinary Tract:  Bilateral kidneys and adrenal glands are normal in appearance. No hydroureteronephrosis. Urinary bladder is normal in appearance. Stomach/Bowel: Normal appearance of the stomach. Duodenal diverticulum adjacent to the ampulla, without surrounding inflammatory changes. No pathologic dilatation of small bowel or colon. Areas of mural thickening, mucosal hyperenhancement and subtle surrounding inflammatory changes are noted in the colon, most evident extending from the splenic flexure to the distal descending colon, compatible with colitis. The appendix is not confidently identified and may be  surgically absent. Regardless, there are no inflammatory changes noted adjacent to the cecum to suggest the presence of an acute appendicitis at this time. Vascular/Lymphatic: Aortic atherosclerosis, without evidence of aneurysm or dissection in the abdominal or pelvic vasculature. No lymphadenopathy noted in the abdomen or pelvis. Reproductive: Uterus and ovaries are unremarkable in appearance. Other: No significant volume of ascites.  No pneumoperitoneum. Musculoskeletal: There are no aggressive appearing lytic or blastic lesions noted in the visualized portions of the skeleton. IMPRESSION: 1. Findings are concerning for an acute colitis, as above. 2. Aortic atherosclerosis. Electronically Signed   By: Vinnie Langton M.D.   On: 06/10/2019 15:29    EKG: Independently reviewed.   Assessment/Plan Principal Problem:   Acute hemorrhagic colitis Active Problems:   Major depression   GERD (gastroesophageal reflux disease)   History of colon polyps   Chronic pain    Acute colitis likely secondary to ischemic vs infectious vs hemorrhagic Patient presenting with acute onset rectal bleeding, reports 10-12 episodes prior to ED presentation.  Initially reported as bright red now changed to dark red.  No recent sick contacts, no NSAID abuse, no use of anticoagulants/antiplatelets.  Associated with diffuse abdominal cramping.   Patient was afebrile but with elevated WBC count of 12.9.  Hemoglobin stable at 13.8.  Lipase within normal limits.  FOBT positive.  CT abdomen/pelvis with inflammatory changes splenic flexure to distal descending colon consistent with acute colitis.  Differential includes ischemic versus infectious versus hemorrhagic. --Admit to inpatient, telemetry --Gastroenterology consulted, Dr. Ronalee Red; appreciate assistance --Check lactic acid and GI PCR panel --Continue empiric antibiotics with ciprofloxacin and Flagyl --Protonix 40 mg IV daily --Clear liquid diet --Monitor hemoglobin closely, transfuse for hemoglobin less than/equal 7.0; or significant active bleeding --Asymptomatic Covid-19 PCR pending for hospital admission screening purposes  Major depression --Continue home Abilify and Effexor  Peripheral neuropathy Follows with outpatient neurology; [facility]. --continue home gabapentin 1800 mg p.o. twice daily  GERD Patient states has been out of her home Prilosec for some time. --Protonix 40 mg IV every 24 hours  Chronic pain Hx osteoarthritis status post bilateral knee replacement --Continue home Percocet and tizanidine   DVT prophylaxis: SCDs, chemical DVT prophylaxis contraindicated in acute GI bleed Code Status: Full code Family Communication: Discussed with patient at bedside, declined contacting spouse or other family members Disposition Plan: Anticipate discharge home once colitis improved and GI bleeding resolved Consults called: Gastroenterology - Dr. Ronalee Red Admission status: Inpatient   Severity of Illness: The appropriate patient status for this patient is INPATIENT. Inpatient status is judged to be reasonable and necessary in order to provide the required intensity of service to ensure the patient's safety. The patient's presenting symptoms, physical exam findings, and initial radiographic and laboratory data in the context of their chronic comorbidities is felt to  place them at high risk for further clinical deterioration. Furthermore, it is not anticipated that the patient will be medically stable for discharge from the hospital within 2 midnights of admission. The following factors support the patient status of inpatient.   " The patient's presenting symptoms include abdominal pain, rectal bleeding " The worrisome physical exam findings include tachycardia, increased respiratory rate " The initial radiographic and laboratory data are worrisome because of elevated the BC count of 12.9, FOBT positive, CT abdomen/pelvis with acute colitis, sinus tachycardia on EKG " The chronic co-morbidities include chronic pain, GERD, depression, history of colon polyps.   * I certify that at the point of admission it is my clinical  judgment that the patient will require inpatient hospital care spanning beyond 2 midnights from the point of admission due to high intensity of service, high risk for further deterioration and high frequency of surveillance required.*    Shanita Kanan J [place] DO [facility] Available via Epic secure chat 7am-7pm After these hours, please refer to coverage provider listed on [email or web address] 06/10/2019, 5:13 PM

## 2019-06-11 LAB — BASIC METABOLIC PANEL
Anion gap: 6 (ref 5–15)
BUN: 11 mg/dL (ref 8–23)
CO2: 26 mmol/L (ref 22–32)
Calcium: 7.9 mg/dL — ABNORMAL LOW (ref 8.9–10.3)
Chloride: 107 mmol/L (ref 98–111)
Creatinine, Ser: 1.03 mg/dL — ABNORMAL HIGH (ref 0.44–1.00)
GFR calc Af Amer: >60 mL/min (ref >60)
GFR calc non Af Amer: 58 mL/min — ABNORMAL LOW (ref >60)
Glucose, Bld: 117 mg/dL — ABNORMAL HIGH (ref 70–99)
Potassium: 3.6 mmol/L (ref 3.5–5.1)
Sodium: 139 mmol/L (ref 135–145)

## 2019-06-11 LAB — CBC
HCT: 37 % (ref 36.0–46.0)
Hemoglobin: 11.5 g/dL — ABNORMAL LOW (ref 12.0–15.0)
MCH: 29.3 pg (ref 26.0–34.0)
MCHC: 31.1 g/dL (ref 30.0–36.0)
MCV: 94.1 fL (ref 80.0–100.0)
Platelets: 254 10*3/uL (ref 150–400)
RBC: 3.93 MIL/uL (ref 3.87–5.11)
RDW: 14.1 % (ref 11.5–15.5)
WBC: 8.3 10*3/uL (ref 4.0–10.5)
nRBC: 0 % (ref 0.0–0.2)

## 2019-06-11 LAB — LACTIC ACID, PLASMA: Lactic Acid, Venous: 1.3 mmol/L (ref 0.5–1.9)

## 2019-06-11 LAB — SARS CORONAVIRUS 2 (TAT 6-24 HRS): SARS Coronavirus 2: NEGATIVE

## 2019-06-11 LAB — MAGNESIUM: Magnesium: 1.9 mg/dL (ref 1.7–2.4)

## 2019-06-11 NOTE — Progress Notes (Addendum)
Patient complained of headache and reports having home supply in purse at bedside of Oxycodone/acetaminophen 7.5-325 mg. Pt made aware medication would need to be sent to pharmacy until discharged. Per pt count there are 38 pills left in bottle. Pt placed medication into bag and the bag was sealed in front of the patient. Medication delivered to pharmacy.

## 2019-06-11 NOTE — Progress Notes (Signed)
PROGRESS NOTE    Tammy Sandoval  FXT:[id number] DOB: 02-08-1956 DOA: 06/10/2019 PCP: [name], MD   Brief Narrative: 64 year old female with history of depression chronic pain syndrome, GERD, history of colonic polyps presented with bright red blood per rectum morning of admission, 10-12 episodes since onset with crampy abdominal discomfort without nausea vomiting.  In the ED vitals stable hemoglobin 13.8 g was 10.2 and 03/17/2018, FOBT positive CT abdomen pelvis showed inflammatory changes splenic flexure to distal descending colon consistent with acute colitis.  EKG sinus tachycardia prolonged QTC 521, patient will start on ceftriaxone and Flagyl, IV pain medication Protonix and fluid.  GI was consulted and patient was admitted for further management.  Subjective: Reports her last bowel movement was yesterday morning. Complains of abdominal cramping. Denies nausea vomiting tolerating clear liquid diet.  Assessment & Plan:  Acute colitis: Likely ischemic colitis. Appreciate GI input monitor hemoglobin closely continue clear liquid diet.  Stool work-up pending, no bowel movement since yesterday morning.  Still having ongoing of crampy abdominal pain continue gentle IV and hydration, continue diet as per GI.  On empiric Cipro Flagyl, leukocytosis has resolved.  Major depression: cont Abilify/Effexor.  GERD: Continue PPI  History of colon polyps:  Chronic pain/osteoarthritis status post bilateral knee replacement/peripheral neuropathy on Percocet and tizanidine hold tizanidine while getting IV antibiotics.  Continue Neurontin.  Body mass index is 32.06 kg/m.  DVT prophylaxis:SCD Code Status: full  Family Communication: plan of care discussed with patient at bedside. Disposition Plan: Patient is from:home Anticipated Disposition: to home ,in 1-2 days Barriers to discharge or conditions that needs to be met prior to discharge: Once no more bleeding, tolerating diet and signed  out by GI.  Consultants: Gastroenterology. Procedures:see note Microbiology:see note  Medications: Scheduled Meds: . ARIPiprazole  7.5 mg Oral Daily  . gabapentin  1,800 mg Oral BID  . hydrOXYzine  25 mg Oral Daily  . pantoprazole (PROTONIX) IV  40 mg Intravenous Q24H  . trazodone  300 mg Oral QHS  . venlafaxine XR  300 mg Oral Daily   Continuous Infusions: . sodium chloride 75 mL/hr at 06/11/19 0829  . ciprofloxacin 400 mg (06/11/19 0830)  . metronidazole 500 mg (06/11/19 0217)    Antimicrobials: Anti-infectives (From admission, onward)   Start     Dose/Rate Route Frequency Ordered Stop   06/10/19 2000  ciprofloxacin (CIPRO) IVPB 400 mg     400 mg 200 mL/hr over 60 Minutes Intravenous Every 12 hours 06/10/19 1816 06/20/19 1959   06/10/19 1900  metroNIDAZOLE (FLAGYL) IVPB 500 mg     500 mg 100 mL/hr over 60 Minutes Intravenous Every 8 hours 06/10/19 1816 06/20/19 1859   06/10/19 1630  cefTRIAXone (ROCEPHIN) 2 g in sodium chloride 0.9 % 100 mL IVPB     2 g 200 mL/hr over 30 Minutes Intravenous  Once 06/10/19 1621 06/10/19 1804   06/10/19 1630  metroNIDAZOLE (FLAGYL) IVPB 500 mg  Status:  Discontinued     500 mg 100 mL/hr over 60 Minutes Intravenous  Once 06/10/19 1621 06/10/19 1948       Objective: Vitals: Today's Vitals   06/10/19 2059 06/10/19 2257 06/11/19 0247 06/11/19 0625  BP: 119/73 (!) 89/60 (!) 83/52 111/62  Pulse: (!) 118 90 80 85  Resp: _0 Temp: (!) 97.5 F (36.4 C) 98.2 F (36.8 C) 97.8 F (36.6 C) 97.9 F (36.6 C)  TempSrc: Oral Oral Oral Oral  SpO2: 99% 92% 92% 96%  Weight:  Height:      PainSc:        Intake/Output Summary (Last 24 hours) at 06/11/2019 1013 Last data filed at 06/11/2019 0200 Gross per 24 hour  Intake 728.64 ml  Output --  Net 728.64 ml   Filed Weights   06/10/19 1327  Weight: 82.1 kg   Weight change:    Intake/Output from previous day: 02/21 0701 - 02/22 0700 In: 728.6 [I.V.:327.9; IV  Piggyback:400.7] Out: -  Intake/Output this shift: No intake/output data recorded.  Examination:  General exam: AAO X3,NAD, weak appearing. HEENT:Oral mucosa moist, Ear/Nose WNL grossly,dentition normal. Respiratory system: bilaterally clear,no wheezing or crackles,no use of accessory muscle, non tender. Cardiovascular system: S1 & S2 +, regular, No JVD. Gastrointestinal system: Abdomen soft, mild tenderness diffusely, obese abdomen, BS+. Nervous System:Alert, awake, moving extremities and grossly nonfocal Extremities: No edema, distal peripheral pulses palpable.  Skin: No rashes,no icterus. MSK: Normal muscle bulk,tone, power  Data Reviewed: I have personally reviewed following labs and imaging studies CBC: Recent Labs  Lab 06/10/19 1340 06/10/19 2253 06/11/19 0419  WBC 12.9*  --  8.3  HGB 13.8 11.7* 11.5*  HCT 42.5 37.2 37.0  MCV 91.8  --  94.1  PLT 316  --  888   Basic Metabolic Panel: Recent Labs  Lab 06/10/19 1340 06/11/19 0419  NA 140 139  K 3.7 3.6  CL 105 107  CO2 26 26  GLUCOSE 170* 117*  BUN 13 11  CREATININE 0.93 1.03*  CALCIUM 8.8* 7.9*  MG  --  1.9   GFR: Estimated Creatinine Clearance: 56.7 mL/min (A) (by C-G formula based on SCr of 1.03 mg/dL (H)). Liver Function Tests: Recent Labs  Lab 06/10/19 1340  AST 22  ALT 29  ALKPHOS 94  BILITOT 0.2*  PROT 7.5  ALBUMIN 3.8   Recent Labs  Lab 06/10/19 1340  LIPASE 23   No results for input(s): AMMONIA in the last 168 hours. Coagulation Profile: Recent Labs  Lab 06/10/19 1853  INR 1.0   Cardiac Enzymes: No results for input(s): CKTOTAL, CKMB, CKMBINDEX, TROPONINI in the last 168 hours. BNP (last 3 results) No results for input(s): PROBNP in the last 8760 hours. HbA1C: No results for input(s): HGBA1C in the last 72 hours. CBG: No results for input(s): GLUCAP in the last 168 hours. Lipid Profile: No results for input(s): CHOL, HDL, LDLCALC, TRIG, CHOLHDL, LDLDIRECT in the last 72  hours. Thyroid Function Tests: No results for input(s): TSH, T4TOTAL, FREET4, T3FREE, THYROIDAB in the last 72 hours. Anemia Panel: No results for input(s): VITAMINB12, FOLATE, FERRITIN, TIBC, IRON, RETICCTPCT in the last 72 hours. Sepsis Labs: Recent Labs  Lab 06/10/19 1853 06/10/19 2253  LATICACIDVEN 1.2 1.3    Recent Results (from the past 240 hour(s))  SARS CORONAVIRUS 2 (TAT 6-24 HRS) Nasopharyngeal Nasopharyngeal Swab     Status: None   Collection Time: 06/10/19  4:42 PM   Specimen: Nasopharyngeal Swab  Result Value Ref Range Status   SARS Coronavirus 2 NEGATIVE NEGATIVE Final    Comment: (NOTE) SARS-CoV-2 target nucleic acids are NOT DETECTED. The SARS-CoV-2 RNA is generally detectable in upper and lower respiratory specimens during the acute phase of infection. Negative results do not preclude SARS-CoV-2 infection, do not rule out co-infections with other pathogens, and should not be used as the sole basis for treatment or other patient management decisions. Negative results must be combined with clinical observations, patient history, and epidemiological information. The expected result is Negative. Fact Sheet for Patients:  SugarRoll.be Fact Sheet for Healthcare Providers: https://www.woods-mathews.com/ This test is not yet approved or cleared by the [place] FDA and  has been authorized for detection and/or diagnosis of SARS-CoV-2 by FDA under an Emergency Use Authorization (EUA). This EUA will remain  in effect (meaning this test can be used) for the duration of the COVID-19 declaration under Section 56 4(b)(1) of the Act, 21 U.S.C. section 360bbb-3(b)(1), unless the authorization is terminated or revoked sooner. Performed at [facility], [place] [address], [place], [place] [address]       Radiology Studies: CT ABDOMEN PELVIS W CONTRAST  Result Date: 06/10/2019 CLINICAL DATA:  64 year old female with history  of rectal bleeding. EXAM: CT ABDOMEN AND PELVIS WITH CONTRAST TECHNIQUE: Multidetector CT imaging of the abdomen and pelvis was performed using the standard protocol following bolus administration of intravenous contrast. CONTRAST:  19m OMNIPAQUE IOHEXOL 300 MG/ML  SOLN COMPARISON:  No priors. FINDINGS: Lower chest: Unremarkable. Hepatobiliary: No suspicious cystic or solid hepatic lesions. No intra or extrahepatic biliary ductal dilatation. Gallbladder is normal in appearance. Pancreas: No pancreatic mass. No pancreatic ductal dilatation. No pancreatic or peripancreatic fluid collections or inflammatory changes. Spleen: Unremarkable. Adrenals/Urinary Tract: Bilateral kidneys and adrenal glands are normal in appearance. No hydroureteronephrosis. Urinary bladder is normal in appearance. Stomach/Bowel: Normal appearance of the stomach. Duodenal diverticulum adjacent to the ampulla, without surrounding inflammatory changes. No pathologic dilatation of small bowel or colon. Areas of mural thickening, mucosal hyperenhancement and subtle surrounding inflammatory changes are noted in the colon, most evident extending from the splenic flexure to the distal descending colon, compatible with colitis. The appendix is not confidently identified and may be surgically absent. Regardless, there are no inflammatory changes noted adjacent to the cecum to suggest the presence of an acute appendicitis at this time. Vascular/Lymphatic: Aortic atherosclerosis, without evidence of aneurysm or dissection in the abdominal or pelvic vasculature. No lymphadenopathy noted in the abdomen or pelvis. Reproductive: Uterus and ovaries are unremarkable in appearance. Other: No significant volume of ascites.  No pneumoperitoneum. Musculoskeletal: There are no aggressive appearing lytic or blastic lesions noted in the visualized portions of the skeleton. IMPRESSION: 1. Findings are concerning for an acute colitis, as above. 2. Aortic  atherosclerosis. Electronically Signed   By: DVinnie LangtonM.D.   On: 06/10/2019 15:29     LOS: 1 day   Time spent: More than 50% of that time was spent in counseling and/or coordination of care.  RAntonieta Pert MD [facility]  06/11/2019, 10:13 AM

## 2019-06-11 NOTE — Consult Note (Signed)
Reason for Consult: Hematochezia and colitis Referring Physician: [facility]  Hermenia Fiscal HPI: This is a 64 year old female with a PMH of GERD, depression, and a tubular adenoma with her colonoscopy on 10/07/2016 admitted for a colitis.  The patient acutely started to have abdominal pain yesterday and it was in the epigastrium/LUQ region.  This was then followed by hematochezia and she estimated 10-12 bloody bowel movements.  Here in the hospital the frequency has decreased, but the volume of the blood is the same.  The difference this time is that the blood is "darker".  A CT scan in the ER revealed a left sided colitis starting at the splenic flexure and terminating in the distal descending colon.  She denies any sick contacts, recent antibiotic use, or fever.    Past Medical History:  Diagnosis Date  . Anemia   . Anxiety   . Arthritis   . Cancer [facility])    skin-surgically removed  . Crescendo transient ischemic attacks    pt has no idea about this, has never had TIA's or strokes  . Depression   . Dyspnea    with exertion  . Generalized headaches   . GERD (gastroesophageal reflux disease)   . Insomnia   . Irritable bowel syndrome    mostly constipation  . Leg pain   . Low back pain   . Obesity   . Palpitations   . Pre-diabetes    hx of   . Restless leg   . Seizures ([place]) 2013   only one she has ever had, placed on neurontin  . Thrombophlebitis   . Ulcer    RLE  . Varicose veins     Past Surgical History:  Procedure Laterality Date  . APPENDECTOMY    . BREAST LUMPECTOMY     bilateral  . CERVICAL DISC ARTHROPLASTY N/A 08/22/2015   Procedure: Cervical five - six Cervical six - seven arthroplasty;  Surgeon: Ashok Pall, MD;  Location: [place] [facility];  Service: Neurosurgery;  Laterality: N/A;  C56 C67 arthroplasty  . CESAREAN SECTION    . COLONOSCOPY    . KNEE ARTHROPLASTY Right 02/23/2016   Procedure: COMPUTER ASSISTED TOTAL KNEE ARTHROPLASTY NAVIGATION;  Surgeon:  Rod Can, MD;  Location: [place];  Service: Orthopedics;  Laterality: Right;  . KNEE ARTHROPLASTY Left 03/15/2018   Procedure: COMPUTER ASSISTED TOTAL KNEE ARTHROPLASTY;  Surgeon: Rod Can, MD;  Location: [facility];  Service: Orthopedics;  Laterality: Left;  . LEG SURGERY  2004   mass removed on right leg  . skin cancer removal  2012 & 2013   2 places on face    Family History  Problem Relation Age of Onset  . Cancer Mother        pancreatic and breast  . Deep vein thrombosis Mother   . Diabetes Mother   . Heart disease Mother   . Other Mother        varicose veing  . Heart disease Father   . Hyperlipidemia Father   . Hypertension Father   . Heart attack Father   . COPD Father   . AAA (abdominal aortic aneurysm) Father   . Diabetes Brother   . Heart attack Brother   . Heart disease Brother     Social History:  reports that she has never smoked. She has never used smokeless tobacco. She reports that she does not drink alcohol or use drugs.  Allergies:  Allergies  Allergen Reactions  . Latex Swelling  Redness,swelling  . Wellbutrin [Bupropion Hcl] Itching and Other (See Comments)    HEAD TINGLES INTOLERANCE: MADE PT. LOOPY    Medications:  Scheduled: . ARIPiprazole  7.5 mg Oral Daily  . gabapentin  1,800 mg Oral BID  . hydrOXYzine  25 mg Oral Daily  . pantoprazole (PROTONIX) IV  40 mg Intravenous Q24H  . tiZANidine  4 mg Oral TID  . trazodone  300 mg Oral QHS  . venlafaxine XR  300 mg Oral Daily   Continuous: . sodium chloride 75 mL/hr at 06/11/19 0200  . ciprofloxacin Stopped (06/10/19 2249)  . metronidazole 500 mg (06/11/19 0217)    Results for orders placed or performed during the hospital encounter of 06/10/19 (from the past 24 hour(s))  Comprehensive metabolic panel     Status: Abnormal   Collection Time: 06/10/19  1:40 PM  Result Value Ref Range   Sodium 140 135 - 145 mmol/L   Potassium 3.7 3.5 - 5.1 mmol/L   Chloride 105 98 - 111 mmol/L    CO2 26 22 - 32 mmol/L   Glucose, Bld 170 (H) 70 - 99 mg/dL   BUN 13 8 - 23 mg/dL   Creatinine, Ser 0.93 0.44 - 1.00 mg/dL   Calcium 8.8 (L) 8.9 - 10.3 mg/dL   Total Protein 7.5 6.5 - 8.1 g/dL   Albumin 3.8 3.5 - 5.0 g/dL   AST 22 15 - 41 U/L   ALT 29 0 - 44 U/L   Alkaline Phosphatase 94 38 - 126 U/L   Total Bilirubin 0.2 (L) 0.3 - 1.2 mg/dL   GFR calc non Af Amer >60 >60 mL/min   GFR calc Af Amer >60 >60 mL/min   Anion gap 9 5 - 15  CBC     Status: Abnormal   Collection Time: 06/10/19  1:40 PM  Result Value Ref Range   WBC 12.9 (H) 4.0 - 10.5 K/uL   RBC 4.63 3.87 - 5.11 MIL/uL   Hemoglobin 13.8 12.0 - 15.0 g/dL   HCT 42.5 36.0 - 46.0 %   MCV 91.8 80.0 - 100.0 fL   MCH 29.8 26.0 - 34.0 pg   MCHC 32.5 30.0 - 36.0 g/dL   RDW 13.9 11.5 - 15.5 %   Platelets 316 150 - 400 K/uL   nRBC 0.0 0.0 - 0.2 %  Lipase, blood     Status: None   Collection Time: 06/10/19  1:40 PM  Result Value Ref Range   Lipase 23 11 - 51 U/L  Type and screen [place]     Status: None   Collection Time: 06/10/19  1:42 PM  Result Value Ref Range   ABO/RH(D) AB POS    Antibody Screen NEG    Sample Expiration      06/13/2019,2359 Performed at [facility], [place] [address] [place], [place] [address]   POC occult blood, ED     Status: Abnormal   Collection Time: 06/10/19  1:46 PM  Result Value Ref Range   Fecal Occult Bld POSITIVE (A) NEGATIVE  SARS CORONAVIRUS 2 (TAT 6-24 HRS) Nasopharyngeal Nasopharyngeal Swab     Status: None   Collection Time: 06/10/19  4:42 PM   Specimen: Nasopharyngeal Swab  Result Value Ref Range   SARS Coronavirus 2 NEGATIVE NEGATIVE  Lactic acid, plasma     Status: None   Collection Time: 06/10/19  6:53 PM  Result Value Ref Range   Lactic Acid, Venous 1.2 0.5 - 1.9 mmol/L  Protime-INR     Status: None   Collection Time: 06/10/19  6:53 PM  Result Value Ref Range   Prothrombin Time 13.1 11.4 - 15.2 seconds   INR 1.0 0.8 - 1.2  Lactic  acid, plasma     Status: None   Collection Time: 06/10/19 10:53 PM  Result Value Ref Range   Lactic Acid, Venous 1.3 0.5 - 1.9 mmol/L  Hemoglobin and hematocrit, blood     Status: Abnormal   Collection Time: 06/10/19 10:53 PM  Result Value Ref Range   Hemoglobin 11.7 (L) 12.0 - 15.0 g/dL   HCT 37.2 36.0 - 33.3 %  Basic metabolic panel     Status: Abnormal   Collection Time: 06/11/19  4:19 AM  Result Value Ref Range   Sodium 139 135 - 145 mmol/L   Potassium 3.6 3.5 - 5.1 mmol/L   Chloride 107 98 - 111 mmol/L   CO2 26 22 - 32 mmol/L   Glucose, Bld 117 (H) 70 - 99 mg/dL   BUN 11 8 - 23 mg/dL   Creatinine, Ser 1.03 (H) 0.44 - 1.00 mg/dL   Calcium 7.9 (L) 8.9 - 10.3 mg/dL   GFR calc non Af Amer 58 (L) >60 mL/min   GFR calc Af Amer >60 >60 mL/min   Anion gap 6 5 - 15  CBC     Status: Abnormal   Collection Time: 06/11/19  4:19 AM  Result Value Ref Range   WBC 8.3 4.0 - 10.5 K/uL   RBC 3.93 3.87 - 5.11 MIL/uL   Hemoglobin 11.5 (L) 12.0 - 15.0 g/dL   HCT 37.0 36.0 - 46.0 %   MCV 94.1 80.0 - 100.0 fL   MCH 29.3 26.0 - 34.0 pg   MCHC 31.1 30.0 - 36.0 g/dL   RDW 14.1 11.5 - 15.5 %   Platelets 254 150 - 400 K/uL   nRBC 0.0 0.0 - 0.2 %  Magnesium     Status: None   Collection Time: 06/11/19  4:19 AM  Result Value Ref Range   Magnesium 1.9 1.7 - 2.4 mg/dL     CT ABDOMEN PELVIS W CONTRAST  Result Date: 06/10/2019 CLINICAL DATA:  64 year old female with history of rectal bleeding. EXAM: CT ABDOMEN AND PELVIS WITH CONTRAST TECHNIQUE: Multidetector CT imaging of the abdomen and pelvis was performed using the standard protocol following bolus administration of intravenous contrast. CONTRAST:  169m OMNIPAQUE IOHEXOL 300 MG/ML  SOLN COMPARISON:  No priors. FINDINGS: Lower chest: Unremarkable. Hepatobiliary: No suspicious cystic or solid hepatic lesions. No intra or extrahepatic biliary ductal dilatation. Gallbladder is normal in appearance. Pancreas: No pancreatic mass. No pancreatic ductal  dilatation. No pancreatic or peripancreatic fluid collections or inflammatory changes. Spleen: Unremarkable. Adrenals/Urinary Tract: Bilateral kidneys and adrenal glands are normal in appearance. No hydroureteronephrosis. Urinary bladder is normal in appearance. Stomach/Bowel: Normal appearance of the stomach. Duodenal diverticulum adjacent to the ampulla, without surrounding inflammatory changes. No pathologic dilatation of small bowel or colon. Areas of mural thickening, mucosal hyperenhancement and subtle surrounding inflammatory changes are noted in the colon, most evident extending from the splenic flexure to the distal descending colon, compatible with colitis. The appendix is not confidently identified and may be surgically absent. Regardless, there are no inflammatory changes noted adjacent to the cecum to suggest the presence of an acute appendicitis at this time. Vascular/Lymphatic: Aortic atherosclerosis, without evidence of aneurysm or dissection in the abdominal or pelvic vasculature. No lymphadenopathy noted in the abdomen or pelvis. Reproductive: Uterus  and ovaries are unremarkable in appearance. Other: No significant volume of ascites.  No pneumoperitoneum. Musculoskeletal: There are no aggressive appearing lytic or blastic lesions noted in the visualized portions of the skeleton. IMPRESSION: 1. Findings are concerning for an acute colitis, as above. 2. Aortic atherosclerosis. Electronically Signed   By: Vinnie Langton M.D.   On: 06/10/2019 15:29    ROS:  As stated above in the HPI otherwise negative.  Blood pressure 111/62, pulse 85, temperature 97.9 F (36.6 C), temperature source Oral, resp. rate 18, height 5' 3"  (1.6 m), weight 82.1 kg, SpO2 96 %.    PE: Gen: NAD, Alert and Oriented HEENT:  [place]/AT, EOMI Neck: Supple, no LAD Lungs: CTA Bilaterally CV: RRR without M/G/R ABM: Soft, tender in the epigastrium and LUQ, +BS Ext: No C/C/E  Assessment/Plan: 1) Colitis - most likely an  ischemic colitis. 2) Epigastric pain/LUQ pain. 3) Mild leukocytosis - resolved.   The patient's clinical history presentation and the CT scan are consistent with an ischemic colitis.  Clinically she is well and hemodynamically stable.  Currently she continues to have abdominal cramping, which has not changed.  But, she does notice a decline in the frequency of bleeding.  Plan: 1) Agree with antibiotics for now. 2) Follow HGB and transfuse as necessary. 3) Maintain clear liquid diet.  Shilynn Hoch D 06/11/2019, 7:45 AM

## 2019-06-11 NOTE — Progress Notes (Signed)
Patient reports having a bowel movement this am around 0900 described by patient as  dark red in color, liquid consistency. Discussed need to collect stool sample within 48 hour timeframe at that time. No bowel movement since this am.

## 2019-06-12 LAB — BASIC METABOLIC PANEL
Anion gap: 6 (ref 5–15)
BUN: 6 mg/dL — ABNORMAL LOW (ref 8–23)
CO2: 26 mmol/L (ref 22–32)
Calcium: 8.1 mg/dL — ABNORMAL LOW (ref 8.9–10.3)
Chloride: 111 mmol/L (ref 98–111)
Creatinine, Ser: 0.85 mg/dL (ref 0.44–1.00)
GFR calc Af Amer: >60 mL/min (ref >60)
GFR calc non Af Amer: >60 mL/min (ref >60)
Glucose, Bld: 105 mg/dL — ABNORMAL HIGH (ref 70–99)
Potassium: 3.9 mmol/L (ref 3.5–5.1)
Sodium: 143 mmol/L (ref 135–145)

## 2019-06-12 LAB — CBC
HCT: 35.9 % — ABNORMAL LOW (ref 36.0–46.0)
Hemoglobin: 11.2 g/dL — ABNORMAL LOW (ref 12.0–15.0)
MCH: 29.6 pg (ref 26.0–34.0)
MCHC: 31.2 g/dL (ref 30.0–36.0)
MCV: 94.7 fL (ref 80.0–100.0)
Platelets: 266 10*3/uL (ref 150–400)
RBC: 3.79 MIL/uL — ABNORMAL LOW (ref 3.87–5.11)
RDW: 14.1 % (ref 11.5–15.5)
WBC: 6.7 10*3/uL (ref 4.0–10.5)
nRBC: 0 % (ref 0.0–0.2)

## 2019-06-12 LAB — HIV ANTIBODY (ROUTINE TESTING W REFLEX): HIV Screen 4th Generation wRfx: NONREACTIVE — AB

## 2019-06-12 NOTE — Plan of Care (Signed)
  Problem: Education: Goal: Knowledge of General Education information will improve Description Including pain rating scale, medication(s)/side effects and non-pharmacologic comfort measures Outcome: Progressing   

## 2019-06-12 NOTE — TOC Progression Note (Signed)
Transition of Care [facility]) - Progression Note    Patient Details  Name: Tammy Sandoval MRN: [id number] Date of Birth: 10/17/1955  [facility] CM/SW Contact  Purcell Mouton, RN Phone Number: 06/12/2019, 2:47 PM  Clinical Narrative:     Pt states that she will not need HH when she discharge home. CM signing off. There are no discharge needs at present time.   Expected Discharge Plan: Home/Self Care Barriers to Discharge: No Barriers Identified  Expected Discharge Plan and Services Expected Discharge Plan: Home/Self Care   Discharge Planning Services: CM Consult   Living arrangements for the past 2 months: Single Family Home                                       Social Determinants of Health (SDOH) Interventions    Readmission Risk Interventions No flowsheet data found.

## 2019-06-12 NOTE — Progress Notes (Signed)
Subjective: Bleeding stopped yesterday around noon, but she still has abdominal pain.  Objective: Vital signs in last 24 hours: Temp:  [97.9 F (36.6 C)-98.4 F (36.9 C)] 98.2 F (36.8 C) (02/23 0553) Pulse Rate:  [79-86] 86 (02/23 0553) Resp:  [17-18] 18 (02/23 0553) BP: (98-116)/(58-64) 116/64 (02/23 0553) SpO2:  [95 %-97 %] 97 % (02/23 0553) Last BM Date: 06/11/19  Intake/Output from previous day: 02/22 0701 - 02/23 0700 In: 1997.6 [I.V.:1297.5; IV Piggyback:700.1] Out: -  Intake/Output this shift: Total I/O In: 1997.6 [I.V.:1297.5; IV Piggyback:700.1] Out: -   General appearance: alert and no distress GI: tender in the left side, no tenderness in the epigastric region  Lab Results: Recent Labs    06/10/19 1340 06/10/19 1340 06/10/19 2253 06/11/19 0419 06/12/19 0316  WBC 12.9*  --   --  8.3 6.7  HGB 13.8   < > 11.7* 11.5* 11.2*  HCT 42.5   < > 37.2 37.0 35.9*  PLT 316  --   --  254 266   < > = values in this interval not displayed.   BMET Recent Labs    06/10/19 1340 06/11/19 0419 06/12/19 0316  NA 140 139 143  K 3.7 3.6 3.9  CL 105 107 111  CO2 26 26 26   GLUCOSE 170* 117* 105*  BUN 13 11 6*  CREATININE 0.93 1.03* 0.85  CALCIUM 8.8* 7.9* 8.1*   LFT Recent Labs    06/10/19 1340  PROT 7.5  ALBUMIN 3.8  AST 22  ALT 29  ALKPHOS 94  BILITOT 0.2*   PT/INR Recent Labs    06/10/19 1853  LABPROT 13.1  INR 1.0   Hepatitis Panel No results for input(s): HEPBSAG, HCVAB, HEPAIGM, HEPBIGM in the last 72 hours. C-Diff No results for input(s): CDIFFTOX in the last 72 hours. Fecal Lactopherrin No results for input(s): FECLLACTOFRN in the last 72 hours.  Studies/Results: CT ABDOMEN PELVIS W CONTRAST  Result Date: 06/10/2019 CLINICAL DATA:  64 year old female with history of rectal bleeding. EXAM: CT ABDOMEN AND PELVIS WITH CONTRAST TECHNIQUE: Multidetector CT imaging of the abdomen and pelvis was performed using the standard protocol following  bolus administration of intravenous contrast. CONTRAST:  173m OMNIPAQUE IOHEXOL 300 MG/ML  SOLN COMPARISON:  No priors. FINDINGS: Lower chest: Unremarkable. Hepatobiliary: No suspicious cystic or solid hepatic lesions. No intra or extrahepatic biliary ductal dilatation. Gallbladder is normal in appearance. Pancreas: No pancreatic mass. No pancreatic ductal dilatation. No pancreatic or peripancreatic fluid collections or inflammatory changes. Spleen: Unremarkable. Adrenals/Urinary Tract: Bilateral kidneys and adrenal glands are normal in appearance. No hydroureteronephrosis. Urinary bladder is normal in appearance. Stomach/Bowel: Normal appearance of the stomach. Duodenal diverticulum adjacent to the ampulla, without surrounding inflammatory changes. No pathologic dilatation of small bowel or colon. Areas of mural thickening, mucosal hyperenhancement and subtle surrounding inflammatory changes are noted in the colon, most evident extending from the splenic flexure to the distal descending colon, compatible with colitis. The appendix is not confidently identified and may be surgically absent. Regardless, there are no inflammatory changes noted adjacent to the cecum to suggest the presence of an acute appendicitis at this time. Vascular/Lymphatic: Aortic atherosclerosis, without evidence of aneurysm or dissection in the abdominal or pelvic vasculature. No lymphadenopathy noted in the abdomen or pelvis. Reproductive: Uterus and ovaries are unremarkable in appearance. Other: No significant volume of ascites.  No pneumoperitoneum. Musculoskeletal: There are no aggressive appearing lytic or blastic lesions noted in the visualized portions of the skeleton. IMPRESSION: 1. Findings are  concerning for an acute colitis, as above. 2. Aortic atherosclerosis. Electronically Signed   By: Vinnie Langton M.D.   On: 06/10/2019 15:29    Medications:  Scheduled: . ARIPiprazole  7.5 mg Oral Daily  . gabapentin  1,800 mg Oral BID   . hydrOXYzine  25 mg Oral Daily  . pantoprazole (PROTONIX) IV  40 mg Intravenous Q24H  . trazodone  300 mg Oral QHS  . venlafaxine XR  300 mg Oral Daily   Continuous: . sodium chloride 75 mL/hr at 06/12/19 0223  . ciprofloxacin Stopped (06/11/19 2222)  . metronidazole 500 mg (06/12/19 0230)    Assessment/Plan: 1) Probable ischemic colitis. 2) Hematochezia.   She appears to be clinically improved in that she does not have any further hematochezia.  Her pain, in her opinion is the same.  Palpation does show that there is no further epigastric pain, but she has more left sided pain.  Plan: 1) Monitor for another day, or even considering D/C home late today, if her symptoms are stable or improved. 2) Okay to maintain antibiotics for now.  LOS: 2 days   Talisha Erby D 06/12/2019, 6:24 AM

## 2019-06-12 NOTE — Progress Notes (Signed)
PROGRESS NOTE    Tammy Sandoval  OBS:[id number] DOB: 14-Dec-1955 DOA: 06/10/2019 PCP: [name], MD   Brief Narrative: 64 year old female with history of depression chronic pain syndrome, GERD, history of colonic polyps presented with bright red blood per rectum morning of admission, 10-12 episodes since onset with crampy abdominal discomfort without nausea vomiting.  In the ED vitals stable hemoglobin 13.8 g was 10.2 and 03/17/2018, FOBT positive CT abdomen pelvis showed inflammatory changes splenic flexure to distal descending colon consistent with acute colitis.  EKG sinus tachycardia prolonged QTC 521, patient will start on ceftriaxone and Flagyl, IV pain medication Protonix and fluid.  GI was consulted and patient was admitted for further management.  Subjective: Patient reports was feeling fine until this morning when she had a bowel movement x1 with blood.  Earlier when she was seen by GI she was feeling good no bowel movement.  Still having abdominal cramps.  Assessment & Plan:  Acute colitis: Likely ischemic colitis. Appreciate GI input-again having bleeding this morning we will continue on current plan with clear liquid diet, IV fluids empiric Cipro/Flagyl. Stool work-up pending.  Afebrile and leukocytosis is resolved.  Major depression: cont Abilify/Effexor.  GERD: Continue PPI  History of colon polyps: Follow-up with GI  Chronic pain/osteoarthritis status post bilateral knee replacement/peripheral neuropathy on Percocet and tizanidine hold tizanidine while getting IV antibiotics.  Continue Neurontin.  Body mass index is 32.06 kg/m.  DVT prophylaxis:SCD Code Status: full  Family Communication: plan of care discussed with patient at bedside. Disposition Plan: Patient is from:home Anticipated Disposition: to home ,in 1-2 days Barriers to discharge or conditions that needs to be met prior to discharge: Remains inpatient for IV fluids IV antibiotics, home once  rectal  bleeding and abd pain improved   Consultants: Gastroenterology. Procedures:see note Microbiology:see note  Medications: Scheduled Meds: . ARIPiprazole  7.5 mg Oral Daily  . gabapentin  1,800 mg Oral BID  . hydrOXYzine  25 mg Oral Daily  . pantoprazole (PROTONIX) IV  40 mg Intravenous Q24H  . trazodone  300 mg Oral QHS  . venlafaxine XR  300 mg Oral Daily   Continuous Infusions: . sodium chloride 75 mL/hr at 06/12/19 0600  . ciprofloxacin 400 mg (06/12/19 1007)  . metronidazole Stopped (06/12/19 0330)    Antimicrobials: Anti-infectives (From admission, onward)   Start     Dose/Rate Route Frequency Ordered Stop   06/10/19 2000  ciprofloxacin (CIPRO) IVPB 400 mg     400 mg 200 mL/hr over 60 Minutes Intravenous Every 12 hours 06/10/19 1816 06/20/19 1959   06/10/19 1900  metroNIDAZOLE (FLAGYL) IVPB 500 mg     500 mg 100 mL/hr over 60 Minutes Intravenous Every 8 hours 06/10/19 1816 06/20/19 1859   06/10/19 1630  cefTRIAXone (ROCEPHIN) 2 g in sodium chloride 0.9 % 100 mL IVPB     2 g 200 mL/hr over 30 Minutes Intravenous  Once 06/10/19 1621 06/10/19 1804   06/10/19 1630  metroNIDAZOLE (FLAGYL) IVPB 500 mg  Status:  Discontinued     500 mg 100 mL/hr over 60 Minutes Intravenous  Once 06/10/19 1621 06/10/19 1948       Objective: Vitals: Today's Vitals   06/11/19 1809 06/11/19 1918 06/11/19 2034 06/12/19 0553  BP:   104/60 116/64  Pulse:   86 86  Resp:   18 18  Temp:   98.4 F (36.9 C) 98.2 F (36.8 C)  TempSrc:   Oral Oral  SpO2:   97% 97%  Weight:  Height:      PainSc: 8  4       Intake/Output Summary (Last 24 hours) at 06/12/2019 1102 Last data filed at 06/12/2019 0600 Gross per 24 hour  Intake 2303.7 ml  Output --  Net 2303.7 ml   Filed Weights   06/10/19 1327  Weight: 82.1 kg   Weight change:    Intake/Output from previous day: 02/22 0701 - 02/23 0700 In: 2303.7 [I.V.:1503.6; IV Piggyback:800.1] Out: -  Intake/Output this shift: No intake/output  data recorded.  Examination:  General exam: AAO X3 not in acute distress, weak.  On room air. HEENT:Oral mucosa moist, Ear/Nose WNL grossly,dentition normal. Respiratory system: bilaterally clear,no wheezing or crackles,no use of accessory muscle, non tender. Cardiovascular system: S1 & S2 +, regular, No JVD. Gastrointestinal system: Abdomen soft, mild tenderness in the lower abdomen, obese,bs+. Nervous System:Alert, awake, moving extremities and grossly nonfocal Extremities: No edema, distal peripheral pulses palpable.  Skin: No rashes,no icterus. MSK: Normal muscle bulk,tone, power  Data Reviewed: I have personally reviewed following labs and imaging studies CBC: Recent Labs  Lab 06/10/19 1340 06/10/19 2253 06/11/19 0419 06/12/19 0316  WBC 12.9*  --  8.3 6.7  HGB 13.8 11.7* 11.5* 11.2*  HCT 42.5 37.2 37.0 35.9*  MCV 91.8  --  94.1 94.7  PLT 316  --  254 973   Basic Metabolic Panel: Recent Labs  Lab 06/10/19 1340 06/11/19 0419 06/12/19 0316  NA 140 139 143  K 3.7 3.6 3.9  CL 105 107 111  CO2 _0 GLUCOSE 170* 117* 105*  BUN 13 11 6*  CREATININE 0.93 1.03* 0.85  CALCIUM 8.8* 7.9* 8.1*  MG  --  1.9  --    GFR: Estimated Creatinine Clearance: 68.8 mL/min (by C-G formula based on SCr of 0.85 mg/dL). Liver Function Tests: Recent Labs  Lab 06/10/19 1340  AST 22  ALT 29  ALKPHOS 94  BILITOT 0.2*  PROT 7.5  ALBUMIN 3.8   Recent Labs  Lab 06/10/19 1340  LIPASE 23   No results for input(s): AMMONIA in the last 168 hours. Coagulation Profile: Recent Labs  Lab 06/10/19 1853  INR 1.0   Cardiac Enzymes: No results for input(s): CKTOTAL, CKMB, CKMBINDEX, TROPONINI in the last 168 hours. BNP (last 3 results) No results for input(s): PROBNP in the last 8760 hours. HbA1C: No results for input(s): HGBA1C in the last 72 hours. CBG: No results for input(s): GLUCAP in the last 168 hours. Lipid Profile: No results for input(s): CHOL, HDL, LDLCALC, TRIG,  CHOLHDL, LDLDIRECT in the last 72 hours. Thyroid Function Tests: No results for input(s): TSH, T4TOTAL, FREET4, T3FREE, THYROIDAB in the last 72 hours. Anemia Panel: No results for input(s): VITAMINB12, FOLATE, FERRITIN, TIBC, IRON, RETICCTPCT in the last 72 hours. Sepsis Labs: Recent Labs  Lab 06/10/19 1853 06/10/19 2253  LATICACIDVEN 1.2 1.3    Recent Results (from the past 240 hour(s))  SARS CORONAVIRUS 2 (TAT 6-24 HRS) Nasopharyngeal Nasopharyngeal Swab     Status: None   Collection Time: 06/10/19  4:42 PM   Specimen: Nasopharyngeal Swab  Result Value Ref Range Status   SARS Coronavirus 2 NEGATIVE NEGATIVE Final    Comment: (NOTE) SARS-CoV-2 target nucleic acids are NOT DETECTED. The SARS-CoV-2 RNA is generally detectable in upper and lower respiratory specimens during the acute phase of infection. Negative results do not preclude SARS-CoV-2 infection, do not rule out co-infections with other pathogens, and should not be used as the sole basis for  treatment or other patient management decisions. Negative results must be combined with clinical observations, patient history, and epidemiological information. The expected result is Negative. Fact Sheet for Patients: SugarRoll.be Fact Sheet for Healthcare Providers: https://www.woods-mathews.com/ This test is not yet approved or cleared by the [place] FDA and  has been authorized for detection and/or diagnosis of SARS-CoV-2 by FDA under an Emergency Use Authorization (EUA). This EUA will remain  in effect (meaning this test can be used) for the duration of the COVID-19 declaration under Section 56 4(b)(1) of the Act, 21 U.S.C. section 360bbb-3(b)(1), unless the authorization is terminated or revoked sooner. Performed at [facility], [place] [address] [place], [place] [address]       Radiology Studies: CT ABDOMEN PELVIS W CONTRAST  Result Date: 06/10/2019 CLINICAL DATA:   64 year old female with history of rectal bleeding. EXAM: CT ABDOMEN AND PELVIS WITH CONTRAST TECHNIQUE: Multidetector CT imaging of the abdomen and pelvis was performed using the standard protocol following bolus administration of intravenous contrast. CONTRAST:  146m OMNIPAQUE IOHEXOL 300 MG/ML  SOLN COMPARISON:  No priors. FINDINGS: Lower chest: Unremarkable. Hepatobiliary: No suspicious cystic or solid hepatic lesions. No intra or extrahepatic biliary ductal dilatation. Gallbladder is normal in appearance. Pancreas: No pancreatic mass. No pancreatic ductal dilatation. No pancreatic or peripancreatic fluid collections or inflammatory changes. Spleen: Unremarkable. Adrenals/Urinary Tract: Bilateral kidneys and adrenal glands are normal in appearance. No hydroureteronephrosis. Urinary bladder is normal in appearance. Stomach/Bowel: Normal appearance of the stomach. Duodenal diverticulum adjacent to the ampulla, without surrounding inflammatory changes. No pathologic dilatation of small bowel or colon. Areas of mural thickening, mucosal hyperenhancement and subtle surrounding inflammatory changes are noted in the colon, most evident extending from the splenic flexure to the distal descending colon, compatible with colitis. The appendix is not confidently identified and may be surgically absent. Regardless, there are no inflammatory changes noted adjacent to the cecum to suggest the presence of an acute appendicitis at this time. Vascular/Lymphatic: Aortic atherosclerosis, without evidence of aneurysm or dissection in the abdominal or pelvic vasculature. No lymphadenopathy noted in the abdomen or pelvis. Reproductive: Uterus and ovaries are unremarkable in appearance. Other: No significant volume of ascites.  No pneumoperitoneum. Musculoskeletal: There are no aggressive appearing lytic or blastic lesions noted in the visualized portions of the skeleton. IMPRESSION: 1. Findings are concerning for an acute colitis,  as above. 2. Aortic atherosclerosis. Electronically Signed   By: DVinnie LangtonM.D.   On: 06/10/2019 15:29     LOS: 2 days   Time spent: More than 50% of that time was spent in counseling and/or coordination of care.  RAntonieta Pert MD [facility]  06/12/2019, 11:02 AM

## 2019-06-13 LAB — CBC
HCT: 35.3 % — ABNORMAL LOW (ref 36.0–46.0)
Hemoglobin: 11.3 g/dL — ABNORMAL LOW (ref 12.0–15.0)
MCH: 29.8 pg (ref 26.0–34.0)
MCHC: 32 g/dL (ref 30.0–36.0)
MCV: 93.1 fL (ref 80.0–100.0)
Platelets: 286 10*3/uL (ref 150–400)
RBC: 3.79 MIL/uL — ABNORMAL LOW (ref 3.87–5.11)
RDW: 14.2 % (ref 11.5–15.5)
WBC: 6.1 10*3/uL (ref 4.0–10.5)
nRBC: 0 % (ref 0.0–0.2)

## 2019-06-13 LAB — BASIC METABOLIC PANEL
Anion gap: 8 (ref 5–15)
BUN: 7 mg/dL — ABNORMAL LOW (ref 8–23)
CO2: 26 mmol/L (ref 22–32)
Calcium: 8.4 mg/dL — ABNORMAL LOW (ref 8.9–10.3)
Chloride: 110 mmol/L (ref 98–111)
Creatinine, Ser: 0.9 mg/dL (ref 0.44–1.00)
GFR calc Af Amer: >60 mL/min (ref >60)
GFR calc non Af Amer: >60 mL/min (ref >60)
Glucose, Bld: 106 mg/dL — ABNORMAL HIGH (ref 70–99)
Potassium: 3.8 mmol/L (ref 3.5–5.1)
Sodium: 144 mmol/L (ref 135–145)

## 2019-06-13 MED ORDER — CIPROFLOXACIN HCL 500 MG PO TABS: 500.0000 mg | ORAL_TABLET | Freq: Two times a day (BID) | ORAL | 0 refills | Status: AC

## 2019-06-13 MED ORDER — METRONIDAZOLE 500 MG PO TABS: 500.0000 mg | ORAL_TABLET | Freq: Three times a day (TID) | ORAL | 0 refills | Status: AC

## 2019-06-13 MED ORDER — ONDANSETRON HCL 4 MG PO TABS: 4.0000 mg | ORAL_TABLET | Freq: Four times a day (QID) | ORAL | 0 refills | Status: DC | PRN

## 2019-06-13 NOTE — Progress Notes (Signed)
AVS given to pt with updated list of medicines. Instructed pt to follow up with PCP. Pt awaiting for husband to pick her up.

## 2019-06-13 NOTE — Discharge Summary (Signed)
Physician Discharge Summary  Tammy Sandoval MCN:[id number] DOB: 1955/12/21 DOA: 06/10/2019  PCP: [name], MD  Admit date: 06/10/2019 Discharge date: 06/13/2019  Admitted From: Home  Disposition:  Home   Recommendations for Outpatient Follow-up:  1. Follow up with PCP in 1-2 weeks 2. Please obtain BMP/CBC in one week 3. Please follow up on the following pending results: GI pathogen 4. Needs to follow up with GI    Discharge Condition: Stable.  CODE STATUS: full code Diet recommendation: Heart Healthy  Brief/Interim Summary: 64 year old with past medical history significant for depression, chronic pain syndrome, history of colonic polyps who presented with bright red blood per rectum in the morning of admission.  She reported 10-12 episodes since onset with cramping abdominal discomfort.  Evaluation in the ED hemoglobin at 13, was 10 on 03/17/2018.  FOBT positive.  CT abdomen and pelvis showed inflammatory changes in the splenic flexure to the distal descending colon consistent with acute colitis.  1-Acute colitis: Ischemic versus infectious in origin: Patient was evaluated by GI who recommend supportive care and IV antibiotics. Patient clinically improved, hemoglobin has remained stable.  She has been tolerating diet. She will be discharged on ciprofloxacin and Flagyl for 4 more days as recommended by GI. GI pathogen pending.  2-Major depression: Continue with Abilify and Effexor  Chronic pain osteoarthritis, status post bilateral knee replacement: Continue with home pain management.   Discharge Diagnoses:  Principal Problem:   Acute hemorrhagic colitis Active Problems:   Major depression   GERD (gastroesophageal reflux disease)   History of colon polyps   Chronic pain    Discharge Instructions  Discharge Instructions    Diet - low sodium heart healthy   Complete by: As directed    Increase activity slowly   Complete by: As directed      Allergies as of  06/13/2019      Reactions   Latex Swelling   Redness,swelling   Wellbutrin [bupropion Hcl] Itching, Other (See Comments)   HEAD TINGLES INTOLERANCE: MADE PT. LOOPY      Medication List    STOP taking these medications   apixaban 2.5 MG Tabs tablet Commonly known as: ELIQUIS   docusate sodium 100 MG capsule Commonly known as: Colace   omeprazole 40 MG capsule Commonly known as: PRILOSEC   senna 8.6 MG Tabs tablet Commonly known as: SENOKOT     TAKE these medications   ARIPiprazole 15 MG tablet Commonly known as: ABILIFY Take 7.5 mg by mouth daily.   ciprofloxacin 500 MG tablet Commonly known as: Cipro Take 1 tablet (500 mg total) by mouth 2 (two) times daily for 4 days.   gabapentin 600 MG tablet Commonly known as: NEURONTIN Take 1,200-1,800 mg by mouth See admin instructions. Take 1200 mg by mouth in the morning and 1800 mg in the evening   hydrOXYzine 25 MG tablet Commonly known as: ATARAX/VISTARIL Take 25 mg by mouth daily.   metroNIDAZOLE 500 MG tablet Commonly known as: Flagyl Take 1 tablet (500 mg total) by mouth 3 (three) times daily for 4 days.   ondansetron 4 MG tablet Commonly known as: ZOFRAN Take 1 tablet (4 mg total) by mouth every 6 (six) hours as needed for nausea. What changed:   when to take this  reasons to take this   oxyCODONE-acetaminophen 7.5-325 MG tablet Commonly known as: PERCOCET Take 1 tablet by mouth every 6 (six) hours as needed for pain.   tiZANidine 4 MG tablet Commonly known as: ZANAFLEX Take 4  mg by mouth in the morning, at noon, and at bedtime.   trazodone 300 MG tablet Commonly known as: DESYREL Take 300 mg by mouth at bedtime.   venlafaxine XR 150 MG 24 hr capsule Commonly known as: EFFEXOR-XR Take 1 capsule (150 mg total) by mouth daily. What changed: how much to take       Allergies  Allergen Reactions  . Latex Swelling    Redness,swelling  . Wellbutrin [Bupropion Hcl] Itching and Other (See Comments)     HEAD TINGLES INTOLERANCE: MADE PT. LOOPY    Consultations:  GI   Procedures/Studies: CT ABDOMEN PELVIS W CONTRAST  Result Date: 06/10/2019 CLINICAL DATA:  63 year old female with history of rectal bleeding. EXAM: CT ABDOMEN AND PELVIS WITH CONTRAST TECHNIQUE: Multidetector CT imaging of the abdomen and pelvis was performed using the standard protocol following bolus administration of intravenous contrast. CONTRAST:  127m OMNIPAQUE IOHEXOL 300 MG/ML  SOLN COMPARISON:  No priors. FINDINGS: Lower chest: Unremarkable. Hepatobiliary: No suspicious cystic or solid hepatic lesions. No intra or extrahepatic biliary ductal dilatation. Gallbladder is normal in appearance. Pancreas: No pancreatic mass. No pancreatic ductal dilatation. No pancreatic or peripancreatic fluid collections or inflammatory changes. Spleen: Unremarkable. Adrenals/Urinary Tract: Bilateral kidneys and adrenal glands are normal in appearance. No hydroureteronephrosis. Urinary bladder is normal in appearance. Stomach/Bowel: Normal appearance of the stomach. Duodenal diverticulum adjacent to the ampulla, without surrounding inflammatory changes. No pathologic dilatation of small bowel or colon. Areas of mural thickening, mucosal hyperenhancement and subtle surrounding inflammatory changes are noted in the colon, most evident extending from the splenic flexure to the distal descending colon, compatible with colitis. The appendix is not confidently identified and may be surgically absent. Regardless, there are no inflammatory changes noted adjacent to the cecum to suggest the presence of an acute appendicitis at this time. Vascular/Lymphatic: Aortic atherosclerosis, without evidence of aneurysm or dissection in the abdominal or pelvic vasculature. No lymphadenopathy noted in the abdomen or pelvis. Reproductive: Uterus and ovaries are unremarkable in appearance. Other: No significant volume of ascites.  No pneumoperitoneum. Musculoskeletal:  There are no aggressive appearing lytic or blastic lesions noted in the visualized portions of the skeleton. IMPRESSION: 1. Findings are concerning for an acute colitis, as above. 2. Aortic atherosclerosis. Electronically Signed   By: DVinnie LangtonM.D.   On: 06/10/2019 15:29      Subjective: Denies abdominal pain no further GI bleed.  Feel well to go home.  Discharge Exam: Vitals:   06/12/19 2144 06/13/19 0435  BP: (!) 110/57 120/70  Pulse: 82 86  Resp: 17 18  Temp: 97.8 F (36.6 C) 98.6 F (37 C)  SpO2: 94% 94%     General: Pt is alert, awake, not in acute distress Cardiovascular: RRR, S1/S2 +, no rubs, no gallops Respiratory: CTA bilaterally, no wheezing, no rhonchi Abdominal: Soft, NT, ND, bowel sounds + Extremities: no edema, no cyanosis    The results of significant diagnostics from this hospitalization (including imaging, microbiology, ancillary and laboratory) are listed below for reference.     Microbiology: Recent Results (from the past 240 hour(s))  SARS CORONAVIRUS 2 (TAT 6-24 HRS) Nasopharyngeal Nasopharyngeal Swab     Status: None   Collection Time: 06/10/19  4:42 PM   Specimen: Nasopharyngeal Swab  Result Value Ref Range Status   SARS Coronavirus 2 NEGATIVE NEGATIVE Final    Comment: (NOTE) SARS-CoV-2 target nucleic acids are NOT DETECTED. The SARS-CoV-2 RNA is generally detectable in upper and lower respiratory specimens during the acute  phase of infection. Negative results do not preclude SARS-CoV-2 infection, do not rule out co-infections with other pathogens, and should not be used as the sole basis for treatment or other patient management decisions. Negative results must be combined with clinical observations, patient history, and epidemiological information. The expected result is Negative. Fact Sheet for Patients: SugarRoll.be Fact Sheet for Healthcare Providers: https://www.woods-mathews.com/ This  test is not yet approved or cleared by the [place] FDA and  has been authorized for detection and/or diagnosis of SARS-CoV-2 by FDA under an Emergency Use Authorization (EUA). This EUA will remain  in effect (meaning this test can be used) for the duration of the COVID-19 declaration under Section 56 4(b)(1) of the Act, 21 U.S.C. section 360bbb-3(b)(1), unless the authorization is terminated or revoked sooner. Performed at [facility], [place] [address], [place], [place] [address]      Labs: BNP (last 3 results) No results for input(s): BNP in the last 8760 hours. Basic Metabolic Panel: Recent Labs  Lab 06/10/19 1340 06/11/19 0419 06/12/19 0316 06/13/19 0343  NA 140 139 143 144  K 3.7 3.6 3.9 3.8  CL 105 107 111 110  CO2 26 26 26 26   GLUCOSE 170* 117* 105* 106*  BUN 13 11 6* 7*  CREATININE 0.93 1.03* 0.85 0.90  CALCIUM 8.8* 7.9* 8.1* 8.4*  MG  --  1.9  --   --    Liver Function Tests: Recent Labs  Lab 06/10/19 1340  AST 22  ALT 29  ALKPHOS 94  BILITOT 0.2*  PROT 7.5  ALBUMIN 3.8   Recent Labs  Lab 06/10/19 1340  LIPASE 23   No results for input(s): AMMONIA in the last 168 hours. CBC: Recent Labs  Lab 06/10/19 1340 06/10/19 2253 06/11/19 0419 06/12/19 0316 06/13/19 0343  WBC 12.9*  --  8.3 6.7 6.1  HGB 13.8 11.7* 11.5* 11.2* 11.3*  HCT 42.5 37.2 37.0 35.9* 35.3*  MCV 91.8  --  94.1 94.7 93.1  PLT 316  --  254 266 286   Cardiac Enzymes: No results for input(s): CKTOTAL, CKMB, CKMBINDEX, TROPONINI in the last 168 hours. BNP: Invalid input(s): POCBNP CBG: No results for input(s): GLUCAP in the last 168 hours. D-Dimer No results for input(s): DDIMER in the last 72 hours. Hgb A1c No results for input(s): HGBA1C in the last 72 hours. Lipid Profile No results for input(s): CHOL, HDL, LDLCALC, TRIG, CHOLHDL, LDLDIRECT in the last 72 hours. Thyroid function studies No results for input(s): TSH, T4TOTAL, T3FREE, THYROIDAB in the last 72  hours.  Invalid input(s): FREET3 Anemia work up No results for input(s): VITAMINB12, FOLATE, FERRITIN, TIBC, IRON, RETICCTPCT in the last 72 hours. Urinalysis No results found for: COLORURINE, APPEARANCEUR, [place], [place], [place], [place], [place], [place], PROTEINUR, UROBILINOGEN, NITRITE, LEUKOCYTESUR Sepsis Labs Invalid input(s): PROCALCITONIN,  WBC,  LACTICIDVEN Microbiology Recent Results (from the past 240 hour(s))  SARS CORONAVIRUS 2 (TAT 6-24 HRS) Nasopharyngeal Nasopharyngeal Swab     Status: None   Collection Time: 06/10/19  4:42 PM   Specimen: Nasopharyngeal Swab  Result Value Ref Range Status   SARS Coronavirus 2 NEGATIVE NEGATIVE Final    Comment: (NOTE) SARS-CoV-2 target nucleic acids are NOT DETECTED. The SARS-CoV-2 RNA is generally detectable in upper and lower respiratory specimens during the acute phase of infection. Negative results do not preclude SARS-CoV-2 infection, do not rule out co-infections with other pathogens, and should not be used as the sole basis for treatment or other patient management decisions.  Negative results must be combined with clinical observations, patient history, and epidemiological information. The expected result is Negative. Fact Sheet for Patients: SugarRoll.be Fact Sheet for Healthcare Providers: https://www.woods-mathews.com/ This test is not yet approved or cleared by the [place] FDA and  has been authorized for detection and/or diagnosis of SARS-CoV-2 by FDA under an Emergency Use Authorization (EUA). This EUA will remain  in effect (meaning this test can be used) for the duration of the COVID-19 declaration under Section 56 4(b)(1) of the Act, 21 U.S.C. section 360bbb-3(b)(1), unless the authorization is terminated or revoked sooner. Performed at [facility], [place] [address], [place], [place] [address]      Time coordinating discharge: 40  minutes  SIGNED:   Elmarie Shiley, MD  [facility]

## 2019-06-13 NOTE — Progress Notes (Signed)
Subjective: No further hematochezia or abdominal pain.  She has some loose stools.  Objective: Vital signs in last 24 hours: Temp:  [97.8 F (36.6 C)-98.6 F (37 C)] 98.6 F (37 C) (02/24 0435) Pulse Rate:  [81-86] 86 (02/24 0435) Resp:  [17-19] 18 (02/24 0435) BP: (110-131)/(57-70) 120/70 (02/24 0435) SpO2:  [94 %-97 %] 94 % (02/24 0435) Last BM Date: 06/12/19  Intake/Output from previous day: 02/23 0701 - 02/24 0700 In: 1463.3 [P.O.:60; I.V.:903.3; IV Piggyback:500] Out: -  Intake/Output this shift: No intake/output data recorded.  General appearance: alert and no distress GI: soft, non-tender; bowel sounds normal; no masses,  no organomegaly  Lab Results: Recent Labs    06/11/19 0419 06/12/19 0316 06/13/19 0343  WBC 8.3 6.7 6.1  HGB 11.5* 11.2* 11.3*  HCT 37.0 35.9* 35.3*  PLT 254 266 286   BMET Recent Labs    06/11/19 0419 06/12/19 0316 06/13/19 0343  NA 139 143 144  K 3.6 3.9 3.8  CL 107 111 110  CO2 26 26 26   GLUCOSE 117* 105* 106*  BUN 11 6* 7*  CREATININE 1.03* 0.85 0.90  CALCIUM 7.9* 8.1* 8.4*   LFT Recent Labs    06/10/19 1340  PROT 7.5  ALBUMIN 3.8  AST 22  ALT 29  ALKPHOS 94  BILITOT 0.2*   PT/INR Recent Labs    06/10/19 1853  LABPROT 13.1  INR 1.0   Hepatitis Panel No results for input(s): HEPBSAG, HCVAB, HEPAIGM, HEPBIGM in the last 72 hours. C-Diff No results for input(s): CDIFFTOX in the last 72 hours. Fecal Lactopherrin No results for input(s): FECLLACTOFRN in the last 72 hours.  Studies/Results: No results found.  Medications:  Scheduled: . ARIPiprazole  7.5 mg Oral Daily  . gabapentin  1,800 mg Oral BID  . hydrOXYzine  25 mg Oral Daily  . pantoprazole (PROTONIX) IV  40 mg Intravenous Q24H  . trazodone  300 mg Oral QHS  . venlafaxine XR  300 mg Oral Daily   Continuous: . sodium chloride 75 mL/hr at 06/13/19 0212  . ciprofloxacin Stopped (06/12/19 2158)  . metronidazole Stopped (06/13/19 0314)     Assessment/Plan: 1) Probable ischemic colitis. 2) Mild anemia.   Clinically she is well.  She can be discharged home from the GI standpoint and to follow up in 1-2 weeks.  Four more days of antibiotics will be reasonable.  LOS: 3 days   HUNG,PATRICK D 06/13/2019, 7:44 AM

## 2019-06-13 NOTE — Care Management Important Message (Signed)
Important Message  Patient Details IM Letter given to Gabriel Earing RN Case Manager to present to the Patient Name: Tammy Sandoval MRN: [id number] Date of Birth: [birth year]   Medicare Important Message Given:  Yes     Kerin Salen 06/13/2019, 9:24 AM

## 2019-06-13 NOTE — Plan of Care (Signed)
  Problem: Education: Goal: Knowledge of General Education information will improve Description: Including pain rating scale, medication(s)/side effects and non-pharmacologic comfort measures Outcome: Adequate for Discharge   Problem: Health Behavior/Discharge Planning: Goal: Ability to manage health-related needs will improve Outcome: Adequate for Discharge   Problem: Nutrition: Goal: Adequate nutrition will be maintained Outcome: Adequate for Discharge   Problem: Elimination: Goal: Will not experience complications related to bowel motility Outcome: Adequate for Discharge

## 2019-06-14 ENCOUNTER — Other Ambulatory Visit: Payer: Self-pay

## 2019-06-14 LAB — GI PATHOGEN PANEL BY PCR, STOOL
Adenovirus F 40/41: NOT DETECTED
Astrovirus: NOT DETECTED
Campylobacter by PCR: NOT DETECTED
Cryptosporidium by PCR: NOT DETECTED
Cyclospora cayetanensis: NOT DETECTED
E coli (ETEC) LT/ST: NOT DETECTED
E coli (STEC): NOT DETECTED
E coli 0157 by PCR: NOT DETECTED
Entamoeba histolytica: NOT DETECTED
Enteroaggregative E coli: NOT DETECTED
Enteropathogenic E coli: NOT DETECTED
G lamblia by PCR: NOT DETECTED
Norovirus GI/GII: NOT DETECTED
Plesiomonas shigelloides: NOT DETECTED
Rotavirus A by PCR: NOT DETECTED
Salmonella by PCR: NOT DETECTED
Sapovirus: NOT DETECTED
Shigella by PCR: NOT DETECTED
Vibrio cholerae: NOT DETECTED
Vibrio: NOT DETECTED
Yersinia enterocolitica: NOT DETECTED

## 2019-06-14 NOTE — Patient Outreach (Signed)
[facility] [facility]) Care Management  06/14/2019  LASHANDA STORLIE 1955-09-10 [id number]   Transition of Care Referral  Referral Date: 06/14/2019 Referral Source: [facility] Discharge Report Date of Discharge: 06/13/2019 Facility: [place]: [facility]   Referral received. Transition of care calls being completed via EMMI-automated calls. RN CM will outreach patient for any red flags received.      Plan: RN CM will close case at this time.    Enzo Montgomery, RN,BSN,CCM [place] [facility] Telephonic Care Management Coordinator Direct Phone: [phone] Toll Free: [phone] Fax: [phone]

## 2019-06-19 DIAGNOSIS — M47816 Spondylosis without myelopathy or radiculopathy, lumbar region: Secondary | ICD-10-CM | POA: Diagnosis not present

## 2019-08-01 DIAGNOSIS — G629 Polyneuropathy, unspecified: Secondary | ICD-10-CM | POA: Diagnosis not present

## 2019-08-01 DIAGNOSIS — M4726 Other spondylosis with radiculopathy, lumbar region: Secondary | ICD-10-CM | POA: Diagnosis not present

## 2019-08-21 DIAGNOSIS — K219 Gastro-esophageal reflux disease without esophagitis: Secondary | ICD-10-CM | POA: Diagnosis not present

## 2019-08-21 DIAGNOSIS — G8929 Other chronic pain: Secondary | ICD-10-CM | POA: Diagnosis not present

## 2019-08-21 DIAGNOSIS — M8580 Other specified disorders of bone density and structure, unspecified site: Secondary | ICD-10-CM | POA: Diagnosis not present

## 2019-08-21 DIAGNOSIS — K5900 Constipation, unspecified: Secondary | ICD-10-CM | POA: Diagnosis not present

## 2019-08-21 DIAGNOSIS — G2581 Restless legs syndrome: Secondary | ICD-10-CM | POA: Diagnosis not present

## 2019-08-21 DIAGNOSIS — M810 Age-related osteoporosis without current pathological fracture: Secondary | ICD-10-CM | POA: Diagnosis not present

## 2019-08-21 DIAGNOSIS — F419 Anxiety disorder, unspecified: Secondary | ICD-10-CM | POA: Diagnosis not present

## 2019-08-21 DIAGNOSIS — M47816 Spondylosis without myelopathy or radiculopathy, lumbar region: Secondary | ICD-10-CM | POA: Diagnosis not present

## 2019-08-21 DIAGNOSIS — F329 Major depressive disorder, single episode, unspecified: Secondary | ICD-10-CM | POA: Diagnosis not present

## 2019-08-29 DIAGNOSIS — F411 Generalized anxiety disorder: Secondary | ICD-10-CM | POA: Diagnosis not present

## 2019-08-29 DIAGNOSIS — F331 Major depressive disorder, recurrent, moderate: Secondary | ICD-10-CM | POA: Diagnosis not present

## 2019-09-28 DIAGNOSIS — G629 Polyneuropathy, unspecified: Secondary | ICD-10-CM | POA: Diagnosis not present

## 2019-09-28 DIAGNOSIS — M4726 Other spondylosis with radiculopathy, lumbar region: Secondary | ICD-10-CM | POA: Diagnosis not present

## 2019-11-21 DIAGNOSIS — F331 Major depressive disorder, recurrent, moderate: Secondary | ICD-10-CM | POA: Diagnosis not present

## 2019-11-21 DIAGNOSIS — F411 Generalized anxiety disorder: Secondary | ICD-10-CM | POA: Diagnosis not present

## 2019-11-28 DIAGNOSIS — G8929 Other chronic pain: Secondary | ICD-10-CM | POA: Diagnosis not present

## 2019-11-28 DIAGNOSIS — M47816 Spondylosis without myelopathy or radiculopathy, lumbar region: Secondary | ICD-10-CM | POA: Diagnosis not present

## 2019-11-28 DIAGNOSIS — M47812 Spondylosis without myelopathy or radiculopathy, cervical region: Secondary | ICD-10-CM | POA: Diagnosis not present

## 2019-11-28 DIAGNOSIS — Z5181 Encounter for therapeutic drug level monitoring: Secondary | ICD-10-CM | POA: Diagnosis not present

## 2019-11-28 DIAGNOSIS — M7918 Myalgia, other site: Secondary | ICD-10-CM | POA: Diagnosis not present

## 2019-11-28 DIAGNOSIS — Z79891 Long term (current) use of opiate analgesic: Secondary | ICD-10-CM | POA: Diagnosis not present

## 2019-12-03 DIAGNOSIS — Z1231 Encounter for screening mammogram for malignant neoplasm of breast: Secondary | ICD-10-CM | POA: Diagnosis not present

## 2019-12-25 DIAGNOSIS — F329 Major depressive disorder, single episode, unspecified: Secondary | ICD-10-CM | POA: Diagnosis not present

## 2019-12-25 DIAGNOSIS — G8929 Other chronic pain: Secondary | ICD-10-CM | POA: Diagnosis not present

## 2019-12-25 DIAGNOSIS — M170 Bilateral primary osteoarthritis of knee: Secondary | ICD-10-CM | POA: Diagnosis not present

## 2019-12-25 DIAGNOSIS — M7918 Myalgia, other site: Secondary | ICD-10-CM | POA: Diagnosis not present

## 2019-12-25 DIAGNOSIS — M810 Age-related osteoporosis without current pathological fracture: Secondary | ICD-10-CM | POA: Diagnosis not present

## 2019-12-25 DIAGNOSIS — M47812 Spondylosis without myelopathy or radiculopathy, cervical region: Secondary | ICD-10-CM | POA: Diagnosis not present

## 2019-12-25 DIAGNOSIS — K219 Gastro-esophageal reflux disease without esophagitis: Secondary | ICD-10-CM | POA: Diagnosis not present

## 2019-12-25 DIAGNOSIS — F419 Anxiety disorder, unspecified: Secondary | ICD-10-CM | POA: Diagnosis not present

## 2019-12-25 DIAGNOSIS — M47816 Spondylosis without myelopathy or radiculopathy, lumbar region: Secondary | ICD-10-CM | POA: Diagnosis not present

## 2019-12-26 DIAGNOSIS — M542 Cervicalgia: Secondary | ICD-10-CM | POA: Diagnosis not present

## 2020-01-04 DIAGNOSIS — M542 Cervicalgia: Secondary | ICD-10-CM | POA: Diagnosis not present

## 2020-01-04 DIAGNOSIS — R0602 Shortness of breath: Secondary | ICD-10-CM | POA: Diagnosis not present

## 2020-01-04 DIAGNOSIS — D509 Iron deficiency anemia, unspecified: Secondary | ICD-10-CM | POA: Diagnosis not present

## 2020-01-04 DIAGNOSIS — G629 Polyneuropathy, unspecified: Secondary | ICD-10-CM | POA: Diagnosis not present

## 2020-01-04 DIAGNOSIS — K219 Gastro-esophageal reflux disease without esophagitis: Secondary | ICD-10-CM | POA: Diagnosis not present

## 2020-01-04 DIAGNOSIS — F418 Other specified anxiety disorders: Secondary | ICD-10-CM | POA: Diagnosis not present

## 2020-01-04 DIAGNOSIS — R7303 Prediabetes: Secondary | ICD-10-CM | POA: Diagnosis not present

## 2020-01-04 DIAGNOSIS — K589 Irritable bowel syndrome without diarrhea: Secondary | ICD-10-CM | POA: Diagnosis not present

## 2020-01-04 DIAGNOSIS — E782 Mixed hyperlipidemia: Secondary | ICD-10-CM | POA: Diagnosis not present

## 2020-01-04 DIAGNOSIS — R002 Palpitations: Secondary | ICD-10-CM | POA: Diagnosis not present

## 2020-01-08 DIAGNOSIS — K219 Gastro-esophageal reflux disease without esophagitis: Secondary | ICD-10-CM | POA: Diagnosis not present

## 2020-01-08 DIAGNOSIS — K589 Irritable bowel syndrome without diarrhea: Secondary | ICD-10-CM | POA: Diagnosis not present

## 2020-01-08 DIAGNOSIS — M542 Cervicalgia: Secondary | ICD-10-CM | POA: Diagnosis not present

## 2020-01-08 DIAGNOSIS — D509 Iron deficiency anemia, unspecified: Secondary | ICD-10-CM | POA: Diagnosis not present

## 2020-01-08 DIAGNOSIS — E782 Mixed hyperlipidemia: Secondary | ICD-10-CM | POA: Diagnosis not present

## 2020-01-08 DIAGNOSIS — R002 Palpitations: Secondary | ICD-10-CM | POA: Diagnosis not present

## 2020-01-08 DIAGNOSIS — G629 Polyneuropathy, unspecified: Secondary | ICD-10-CM | POA: Diagnosis not present

## 2020-01-08 DIAGNOSIS — R7303 Prediabetes: Secondary | ICD-10-CM | POA: Diagnosis not present

## 2020-01-08 DIAGNOSIS — F418 Other specified anxiety disorders: Secondary | ICD-10-CM | POA: Diagnosis not present

## 2020-01-08 DIAGNOSIS — R102 Pelvic and perineal pain: Secondary | ICD-10-CM | POA: Diagnosis not present

## 2020-01-15 DIAGNOSIS — Z124 Encounter for screening for malignant neoplasm of cervix: Secondary | ICD-10-CM | POA: Diagnosis not present

## 2020-01-15 DIAGNOSIS — E782 Mixed hyperlipidemia: Secondary | ICD-10-CM | POA: Diagnosis not present

## 2020-01-15 DIAGNOSIS — R7303 Prediabetes: Secondary | ICD-10-CM | POA: Diagnosis not present

## 2020-01-15 DIAGNOSIS — F418 Other specified anxiety disorders: Secondary | ICD-10-CM | POA: Diagnosis not present

## 2020-01-15 DIAGNOSIS — M542 Cervicalgia: Secondary | ICD-10-CM | POA: Diagnosis not present

## 2020-01-15 DIAGNOSIS — D509 Iron deficiency anemia, unspecified: Secondary | ICD-10-CM | POA: Diagnosis not present

## 2020-01-15 DIAGNOSIS — G629 Polyneuropathy, unspecified: Secondary | ICD-10-CM | POA: Diagnosis not present

## 2020-01-15 DIAGNOSIS — K219 Gastro-esophageal reflux disease without esophagitis: Secondary | ICD-10-CM | POA: Diagnosis not present

## 2020-01-15 DIAGNOSIS — R002 Palpitations: Secondary | ICD-10-CM | POA: Diagnosis not present

## 2020-01-16 DIAGNOSIS — M545 Low back pain: Secondary | ICD-10-CM | POA: Diagnosis not present

## 2020-01-16 DIAGNOSIS — M4726 Other spondylosis with radiculopathy, lumbar region: Secondary | ICD-10-CM | POA: Diagnosis not present

## 2020-01-16 DIAGNOSIS — G629 Polyneuropathy, unspecified: Secondary | ICD-10-CM | POA: Diagnosis not present

## 2020-01-23 NOTE — Progress Notes (Signed)
Patient referred by [name], MD for palpitations  Subjective:   Tammy Sandoval, female    DOB: [birth year], 64 y.o.   MRN: [id number]   Chief Complaint  Patient presents with  . Palpitations  . New Patient (Initial Visit)     HPI  64 y.o. Caucasian female with palpitations  Patient has had episodes of palpitations/skipped beats over last few weeks. Episodes are associated with shortness of breath, but no other symptoms.  The symptoms have improved after patient was started on diltiazem by her PCP.   Past Medical History:  Diagnosis Date  . Anemia   . Anxiety   . Arthritis   . Cancer [facility])    skin-surgically removed  . Crescendo transient ischemic attacks    pt has no idea about this, has never had TIA's or strokes  . Depression   . Dyspnea    with exertion  . Generalized headaches   . GERD (gastroesophageal reflux disease)   . Insomnia   . Irritable bowel syndrome    mostly constipation  . Leg pain   . Low back pain   . Obesity   . Palpitations   . Pre-diabetes    hx of   . Restless leg   . Seizures ([place]) 2013   only one she has ever had, placed on neurontin  . Thrombophlebitis   . Ulcer    RLE  . Varicose veins      Past Surgical History:  Procedure Laterality Date  . APPENDECTOMY    . BREAST LUMPECTOMY     bilateral  . CERVICAL DISC ARTHROPLASTY N/A 08/22/2015   Procedure: Cervical five - six Cervical six - seven arthroplasty;  Surgeon: Ashok Pall, MD;  Location: [place] [facility];  Service: Neurosurgery;  Laterality: N/A;  C56 C67 arthroplasty  . CESAREAN SECTION    . COLONOSCOPY    . KNEE ARTHROPLASTY Right 02/23/2016   Procedure: COMPUTER ASSISTED TOTAL KNEE ARTHROPLASTY NAVIGATION;  Surgeon: Rod Can, MD;  Location: [place];  Service: Orthopedics;  Laterality: Right;  . KNEE ARTHROPLASTY Left 03/15/2018   Procedure: COMPUTER ASSISTED TOTAL KNEE ARTHROPLASTY;  Surgeon: Rod Can, MD;  Location: [facility];  Service: Orthopedics;   Laterality: Left;  . LEG SURGERY  2004   mass removed on right leg  . skin cancer removal  2012 & 2013   2 places on face     Social History   Tobacco Use  Smoking Status Never Smoker  Smokeless Tobacco Never Used    Social History   Substance and Sexual Activity  Alcohol Use No     Family History  Problem Relation Age of Onset  . Cancer Mother        pancreatic and breast  . Deep vein thrombosis Mother   . Diabetes Mother   . Heart disease Mother   . Other Mother        varicose veing  . Heart disease Father   . Hyperlipidemia Father   . Hypertension Father   . Heart attack Father   . COPD Father   . AAA (abdominal aortic aneurysm) Father   . Diabetes Brother   . Heart attack Brother   . Heart disease Brother      Current Outpatient Medications on File Prior to Visit  Medication Sig Dispense Refill  . ARIPiprazole (ABILIFY) 15 MG tablet Take 7.5 mg by mouth daily.   0  . baclofen (LIORESAL) 10 MG tablet Take 1 tablet by mouth  3 (three) times daily as needed.    . gabapentin (NEURONTIN) 600 MG tablet Take 1,200-1,800 mg by mouth See admin instructions. Take 1200 mg by mouth in the morning and 1800 mg in the evening  0  . hydrOXYzine (ATARAX/VISTARIL) 25 MG tablet Take 25 mg by mouth daily.    Marland Kitchen omeprazole (PRILOSEC) 40 MG capsule Take 1 capsule by mouth daily.    Marland Kitchen oxyCODONE-acetaminophen (PERCOCET) 7.5-325 MG tablet Take 1 tablet by mouth every 6 (six) hours as needed for pain.    . trazodone (DESYREL) 300 MG tablet Take 300 mg by mouth at bedtime.   0  . venlafaxine XR (EFFEXOR-XR) 150 MG 24 hr capsule Take 1 capsule (150 mg total) by mouth daily. (Patient taking differently: Take 300 mg by mouth daily. )     No current facility-administered medications on file prior to visit.    Cardiovascular and other pertinent studies:  EKG 01/24/2020:  Sinus rhythm 97 bpm Nonspecific T-abnormality  EKG 12/26/2019: Sinus rhythm 84 bpm.  Frequent PVCs.   Recent  labs: 05/24/2019: Glucose 106, BUN/Cr 7/0.9. EGFR >60. Na/K 144/3.8.  H/H 11/35. MCV 93. Platelets 286 HbA1C 6.0% Chol 201, TG 92, HDL 48, LDL 136 TSH 2.3 normal   Review of Systems  Cardiovascular: Positive for palpitations. Negative for chest pain, dyspnea on exertion, leg swelling and syncope.         Vitals:   01/24/20 0928  BP: 123/69  Pulse: 99  Resp: 16  SpO2: 93%     Body mass index is 32.42 kg/m. Filed Weights   01/24/20 0928  Weight: 183 lb (83 kg)     Objective:   Physical Exam Vitals and nursing note reviewed.  Constitutional:      General: She is not in acute distress. Neck:     Vascular: No JVD.  Cardiovascular:     Rate and Rhythm: Normal rate and regular rhythm.     Heart sounds: Normal heart sounds. No murmur heard.   Pulmonary:     Effort: Pulmonary effort is normal.     Breath sounds: Normal breath sounds. No wheezing or rales.          Assessment & Recommendations:   64 year old Caucasian female with symptomatic PVCs  Symptomatic PVCs: Seen on EKG performed by PCP.  No PVCs seen on EKG today.  There does not seem to be helping.  Change to diltiazem 180 mg once daily.  Will obtain echocardiogram to rule out structural abnormality.  In future, if her symptoms worsen, would recommend 1 week cardiac telemetry monitoring.  Family history of abdominal aortic aneurysm: Recommend aorta duplex for screening.  Hyperlipidemia: 10-year ASCVD risk 5%.  No statin indicated at this time.  Recommend heart healthy diet and lifestyle.  I will see her on as-needed basis.    Thank you for referring the patient to [place]. Please feel free to contact with any questions.   Nigel Mormon, MD Pager: [phone] Office: [phone]

## 2020-01-24 ENCOUNTER — Other Ambulatory Visit: Payer: Self-pay

## 2020-01-24 ENCOUNTER — Ambulatory Visit: Payer: Medicare HMO | Admitting: Cardiology

## 2020-01-24 ENCOUNTER — Encounter: Payer: Self-pay | Admitting: Cardiology

## 2020-01-24 VITALS — BP 123/69 | HR 99 | Resp 16 | Ht 63.0 in | Wt 183.0 lb

## 2020-01-24 DIAGNOSIS — Z8249 Family history of ischemic heart disease and other diseases of the circulatory system: Secondary | ICD-10-CM

## 2020-01-24 DIAGNOSIS — R002 Palpitations: Secondary | ICD-10-CM

## 2020-01-24 DIAGNOSIS — I493 Ventricular premature depolarization: Secondary | ICD-10-CM | POA: Diagnosis not present

## 2020-01-24 MED ORDER — DILTIAZEM HCL ER COATED BEADS 180 MG PO CP24: 180.0000 mg | ORAL_CAPSULE | Freq: Every day | ORAL | 3 refills | Status: DC

## 2020-01-30 ENCOUNTER — Ambulatory Visit: Payer: Medicare HMO

## 2020-01-30 ENCOUNTER — Other Ambulatory Visit: Payer: Self-pay

## 2020-01-30 DIAGNOSIS — Z8249 Family history of ischemic heart disease and other diseases of the circulatory system: Secondary | ICD-10-CM

## 2020-01-30 DIAGNOSIS — I493 Ventricular premature depolarization: Secondary | ICD-10-CM | POA: Diagnosis not present

## 2020-01-30 DIAGNOSIS — R0602 Shortness of breath: Secondary | ICD-10-CM | POA: Diagnosis not present

## 2020-01-31 ENCOUNTER — Telehealth: Payer: Self-pay

## 2020-01-31 ENCOUNTER — Other Ambulatory Visit: Payer: Self-pay | Admitting: Cardiology

## 2020-01-31 DIAGNOSIS — I493 Ventricular premature depolarization: Secondary | ICD-10-CM

## 2020-01-31 DIAGNOSIS — R9431 Abnormal electrocardiogram [ECG] [EKG]: Secondary | ICD-10-CM

## 2020-01-31 NOTE — Telephone Encounter (Signed)
Pt was informed about her echo and would need to repeat in 6 month.

## 2020-02-04 ENCOUNTER — Telehealth: Payer: Self-pay

## 2020-02-04 NOTE — Telephone Encounter (Signed)
Unable to reach pt. Left vm to cb.

## 2020-02-04 NOTE — Progress Notes (Signed)
Left vm to cb.

## 2020-02-04 NOTE — Telephone Encounter (Signed)
-----   Message from [facility], MD sent at 02/04/2020  8:19 AM EDT ----- Normal abdominal aorta. No aneurysm.  Thanks MJP

## 2020-02-05 NOTE — Telephone Encounter (Signed)
Unable to speak with patient. Left vm to vb.

## 2020-02-05 NOTE — Progress Notes (Signed)
Called and spoke with patient. She already has echo fu appt scheduled.

## 2020-02-18 DIAGNOSIS — F331 Major depressive disorder, recurrent, moderate: Secondary | ICD-10-CM | POA: Diagnosis not present

## 2020-02-18 DIAGNOSIS — F411 Generalized anxiety disorder: Secondary | ICD-10-CM | POA: Diagnosis not present

## 2020-03-27 DIAGNOSIS — M545 Low back pain, unspecified: Secondary | ICD-10-CM | POA: Diagnosis not present

## 2020-03-27 DIAGNOSIS — G629 Polyneuropathy, unspecified: Secondary | ICD-10-CM | POA: Diagnosis not present

## 2020-03-27 DIAGNOSIS — M4726 Other spondylosis with radiculopathy, lumbar region: Secondary | ICD-10-CM | POA: Diagnosis not present

## 2020-05-12 DIAGNOSIS — F411 Generalized anxiety disorder: Secondary | ICD-10-CM | POA: Diagnosis not present

## 2020-05-12 DIAGNOSIS — F331 Major depressive disorder, recurrent, moderate: Secondary | ICD-10-CM | POA: Diagnosis not present

## 2020-05-28 DIAGNOSIS — M7918 Myalgia, other site: Secondary | ICD-10-CM | POA: Diagnosis not present

## 2020-05-28 DIAGNOSIS — G894 Chronic pain syndrome: Secondary | ICD-10-CM | POA: Diagnosis not present

## 2020-05-28 DIAGNOSIS — M47816 Spondylosis without myelopathy or radiculopathy, lumbar region: Secondary | ICD-10-CM | POA: Diagnosis not present

## 2020-05-28 DIAGNOSIS — Z79891 Long term (current) use of opiate analgesic: Secondary | ICD-10-CM | POA: Diagnosis not present

## 2020-05-28 DIAGNOSIS — G8929 Other chronic pain: Secondary | ICD-10-CM | POA: Diagnosis not present

## 2020-05-28 DIAGNOSIS — M47812 Spondylosis without myelopathy or radiculopathy, cervical region: Secondary | ICD-10-CM | POA: Diagnosis not present

## 2020-05-28 DIAGNOSIS — Z5181 Encounter for therapeutic drug level monitoring: Secondary | ICD-10-CM | POA: Diagnosis not present

## 2020-06-05 DIAGNOSIS — R7303 Prediabetes: Secondary | ICD-10-CM | POA: Diagnosis not present

## 2020-06-05 DIAGNOSIS — E559 Vitamin D deficiency, unspecified: Secondary | ICD-10-CM | POA: Diagnosis not present

## 2020-06-05 DIAGNOSIS — R002 Palpitations: Secondary | ICD-10-CM | POA: Diagnosis not present

## 2020-06-05 DIAGNOSIS — K219 Gastro-esophageal reflux disease without esophagitis: Secondary | ICD-10-CM | POA: Diagnosis not present

## 2020-06-05 DIAGNOSIS — D509 Iron deficiency anemia, unspecified: Secondary | ICD-10-CM | POA: Diagnosis not present

## 2020-06-05 DIAGNOSIS — E782 Mixed hyperlipidemia: Secondary | ICD-10-CM | POA: Diagnosis not present

## 2020-06-05 DIAGNOSIS — M542 Cervicalgia: Secondary | ICD-10-CM | POA: Diagnosis not present

## 2020-06-05 DIAGNOSIS — F418 Other specified anxiety disorders: Secondary | ICD-10-CM | POA: Diagnosis not present

## 2020-06-05 DIAGNOSIS — R0602 Shortness of breath: Secondary | ICD-10-CM | POA: Diagnosis not present

## 2020-06-05 DIAGNOSIS — G629 Polyneuropathy, unspecified: Secondary | ICD-10-CM | POA: Diagnosis not present

## 2020-06-24 DIAGNOSIS — G894 Chronic pain syndrome: Secondary | ICD-10-CM | POA: Diagnosis not present

## 2020-06-24 DIAGNOSIS — Z888 Allergy status to other drugs, medicaments and biological substances status: Secondary | ICD-10-CM | POA: Diagnosis not present

## 2020-06-24 DIAGNOSIS — M810 Age-related osteoporosis without current pathological fracture: Secondary | ICD-10-CM | POA: Diagnosis not present

## 2020-06-24 DIAGNOSIS — K219 Gastro-esophageal reflux disease without esophagitis: Secondary | ICD-10-CM | POA: Diagnosis not present

## 2020-06-24 DIAGNOSIS — Z91040 Latex allergy status: Secondary | ICD-10-CM | POA: Diagnosis not present

## 2020-06-24 DIAGNOSIS — G2581 Restless legs syndrome: Secondary | ICD-10-CM | POA: Diagnosis not present

## 2020-06-24 DIAGNOSIS — M7918 Myalgia, other site: Secondary | ICD-10-CM | POA: Diagnosis not present

## 2020-06-24 DIAGNOSIS — Z79899 Other long term (current) drug therapy: Secondary | ICD-10-CM | POA: Diagnosis not present

## 2020-06-24 DIAGNOSIS — M47816 Spondylosis without myelopathy or radiculopathy, lumbar region: Secondary | ICD-10-CM | POA: Diagnosis not present

## 2020-06-30 DIAGNOSIS — M4726 Other spondylosis with radiculopathy, lumbar region: Secondary | ICD-10-CM | POA: Diagnosis not present

## 2020-06-30 DIAGNOSIS — M545 Low back pain, unspecified: Secondary | ICD-10-CM | POA: Diagnosis not present

## 2020-06-30 DIAGNOSIS — G629 Polyneuropathy, unspecified: Secondary | ICD-10-CM | POA: Diagnosis not present

## 2020-07-03 DIAGNOSIS — M542 Cervicalgia: Secondary | ICD-10-CM | POA: Diagnosis not present

## 2020-07-03 DIAGNOSIS — E782 Mixed hyperlipidemia: Secondary | ICD-10-CM | POA: Diagnosis not present

## 2020-07-03 DIAGNOSIS — R002 Palpitations: Secondary | ICD-10-CM | POA: Diagnosis not present

## 2020-07-03 DIAGNOSIS — K589 Irritable bowel syndrome without diarrhea: Secondary | ICD-10-CM | POA: Diagnosis not present

## 2020-07-03 DIAGNOSIS — D509 Iron deficiency anemia, unspecified: Secondary | ICD-10-CM | POA: Diagnosis not present

## 2020-07-03 DIAGNOSIS — K219 Gastro-esophageal reflux disease without esophagitis: Secondary | ICD-10-CM | POA: Diagnosis not present

## 2020-07-03 DIAGNOSIS — R7303 Prediabetes: Secondary | ICD-10-CM | POA: Diagnosis not present

## 2020-07-03 DIAGNOSIS — M79672 Pain in left foot: Secondary | ICD-10-CM | POA: Diagnosis not present

## 2020-07-03 DIAGNOSIS — G629 Polyneuropathy, unspecified: Secondary | ICD-10-CM | POA: Diagnosis not present

## 2020-07-08 DIAGNOSIS — M65871 Other synovitis and tenosynovitis, right ankle and foot: Secondary | ICD-10-CM | POA: Diagnosis not present

## 2020-07-08 DIAGNOSIS — M79671 Pain in right foot: Secondary | ICD-10-CM | POA: Diagnosis not present

## 2020-07-08 DIAGNOSIS — M79672 Pain in left foot: Secondary | ICD-10-CM | POA: Diagnosis not present

## 2020-07-08 DIAGNOSIS — M65872 Other synovitis and tenosynovitis, left ankle and foot: Secondary | ICD-10-CM | POA: Diagnosis not present

## 2020-07-08 DIAGNOSIS — M25572 Pain in left ankle and joints of left foot: Secondary | ICD-10-CM | POA: Diagnosis not present

## 2020-07-08 DIAGNOSIS — M25571 Pain in right ankle and joints of right foot: Secondary | ICD-10-CM | POA: Diagnosis not present

## 2020-07-31 ENCOUNTER — Ambulatory Visit: Payer: Medicare Other

## 2020-07-31 ENCOUNTER — Other Ambulatory Visit: Payer: Self-pay

## 2020-07-31 DIAGNOSIS — R9431 Abnormal electrocardiogram [ECG] [EKG]: Secondary | ICD-10-CM | POA: Diagnosis not present

## 2020-07-31 DIAGNOSIS — I493 Ventricular premature depolarization: Secondary | ICD-10-CM | POA: Diagnosis not present

## 2020-08-04 NOTE — Progress Notes (Signed)
Mildly reduced, but stable and unchanged heart function. Consider f/u visit, non-urgent.  Thanks MJP

## 2020-08-07 NOTE — Progress Notes (Signed)
Called and spoke to pt regarding echo results. Pt voiced understanding and is scheduling a follow up visit with the front.

## 2020-08-19 NOTE — Progress Notes (Signed)
Patient referred by [name], MD for palpitations  Subjective:   Tammy Sandoval, female    DOB: 1956/03/15, 65 y.o.   MRN: [id number]   Chief Complaint  Patient presents with  . Symptomatic PVCs  . Follow-up  . Results    echo     HPI  65 y.o. Caucasian female with exertional dyspnea, PVC's  While palpitations have improved, she still has symptoms form time to time. She also has exertional dyspnea with minimal activity, such as climbing a flight of stairs. She denies any chest pain. Reviewed recent echocardiogram results with the patient, details below.    Current Outpatient Medications on File Prior to Visit  Medication Sig Dispense Refill  . ARIPiprazole (ABILIFY) 15 MG tablet Take 7.5 mg by mouth daily.   0  . baclofen (LIORESAL) 10 MG tablet Take 1 tablet by mouth 3 (three) times daily as needed.    . diltiazem (CARDIZEM CD) 180 MG 24 hr capsule Take 1 capsule (180 mg total) by mouth daily. 90 capsule 3  . gabapentin (NEURONTIN) 600 MG tablet Take 1,200-1,800 mg by mouth See admin instructions. Take 1200 mg by mouth in the morning and 1800 mg in the evening  0  . hydrOXYzine (ATARAX/VISTARIL) 25 MG tablet Take 25 mg by mouth daily.    Marland Kitchen omeprazole (PRILOSEC) 40 MG capsule Take 1 capsule by mouth daily.    Marland Kitchen oxyCODONE-acetaminophen (PERCOCET) 7.5-325 MG tablet Take 1 tablet by mouth every 6 (six) hours as needed for pain.    . trazodone (DESYREL) 300 MG tablet Take 300 mg by mouth at bedtime.   0  . venlafaxine XR (EFFEXOR-XR) 150 MG 24 hr capsule Take 1 capsule (150 mg total) by mouth daily. (Patient taking differently: Take 300 mg by mouth daily. )     No current facility-administered medications on file prior to visit.    Cardiovascular and other pertinent studies:  EKG 08/20/2020: Sinus rhythm 93 bpm Nonspecific T-abnormality  Echocardiogram 07/31/2020:  Left ventricle cavity is normal in size. Mild concentric hypertrophy of  the left ventricle. Mild  global hypokinesis. LVEF 45-50%. Doppler evidence  of grade I (impaired) diastolic dysfunction, normal LAP.  Left atrial cavity is moderately dilated.  Mild (Grade I) mitral regurgitation.  Mild tricuspid regurgitation.  No evidence of pulmonary hypertension.  No significant change compared to previous study on 01/30/2020.  Abdominal Aortic Duplex 01/30/2020:  Normal abdominal aorta. No evidence of aneurysm. No significant  atherosclerotic changes noted. No AAA.  Minimal increase in the left common iliac artery.   EKG 12/26/2019: Sinus rhythm 84 bpm.  Frequent PVCs.  Recent labs: 12/26/2019: Glucose N/A, BUN/Cr 12/0.7. EGFR 81.  HbA1C 6.0% Chol 201, TG 92, HDL 48, LDL 136 TSH 2.3 normal  05/24/2019: Glucose 106, BUN/Cr 7/0.9. EGFR >60. Na/K 144/3.8.  H/H 11/35. MCV 93. Platelets 286 HbA1C 6.0% Chol 201, TG 92, HDL 48, LDL 136 TSH 2.3 normal   Review of Systems  Cardiovascular: Positive for palpitations. Negative for chest pain, dyspnea on exertion, leg swelling and syncope.         Vitals:   08/20/20 0927  BP: 135/73  Pulse: 62  Resp: 16  Temp: 98.3 F (36.8 C)  SpO2: 99%     Body mass index is 33.3 kg/m. Filed Weights   08/20/20 0927  Weight: 188 lb (85.3 kg)     Objective:   Physical Exam Vitals and nursing note reviewed.  Constitutional:      General:  She is not in acute distress. Neck:     Vascular: No JVD.  Cardiovascular:     Rate and Rhythm: Normal rate and regular rhythm.     Heart sounds: Normal heart sounds. No murmur heard.   Pulmonary:     Effort: Pulmonary effort is normal.     Breath sounds: Normal breath sounds. No wheezing or rales.          Assessment & Recommendations:   65 y.o. Caucasian female with exertional dyspnea, PVC's  Exertional dyspnea: Suspect HF with mildly reduced EF. CAD remains in the differential. Will obtain lexiscan nuclear stress test. Also started on Entresto 24-26 mg bid. Repeat BMP in 1  week.  Symptomatic PVCs: Did not have improvement with metoprolol in the past, thus switched to diltiazem. Will check cardiac telemetry to assess PVC burden.  Mixed hyperlipidemia: Started Crestor 10 mg  F/u after stress test    Nigel Mormon, MD Pager: [phone] Office: [phone]

## 2020-08-20 ENCOUNTER — Inpatient Hospital Stay: Payer: Medicare Other

## 2020-08-20 ENCOUNTER — Encounter: Payer: Self-pay | Admitting: Cardiology

## 2020-08-20 ENCOUNTER — Other Ambulatory Visit: Payer: Self-pay

## 2020-08-20 ENCOUNTER — Ambulatory Visit: Payer: Medicare Other | Admitting: Cardiology

## 2020-08-20 VITALS — BP 135/73 | HR 62 | Temp 98.3°F | Resp 16 | Ht 63.0 in | Wt 188.0 lb

## 2020-08-20 DIAGNOSIS — I493 Ventricular premature depolarization: Secondary | ICD-10-CM

## 2020-08-20 DIAGNOSIS — R0600 Dyspnea, unspecified: Secondary | ICD-10-CM

## 2020-08-20 DIAGNOSIS — I5032 Chronic diastolic (congestive) heart failure: Secondary | ICD-10-CM

## 2020-08-20 DIAGNOSIS — E782 Mixed hyperlipidemia: Secondary | ICD-10-CM

## 2020-08-20 DIAGNOSIS — R0609 Other forms of dyspnea: Secondary | ICD-10-CM

## 2020-08-20 MED ORDER — ENTRESTO 24-26 MG PO TABS: 1.0000 | ORAL_TABLET | Freq: Two times a day (BID) | ORAL | 1 refills | Status: DC

## 2020-08-20 MED ORDER — ROSUVASTATIN CALCIUM 10 MG PO TABS: 10.0000 mg | ORAL_TABLET | Freq: Every day | ORAL | 3 refills | Status: DC

## 2020-09-01 ENCOUNTER — Ambulatory Visit: Payer: Medicare Other

## 2020-09-01 ENCOUNTER — Other Ambulatory Visit: Payer: Self-pay

## 2020-09-01 DIAGNOSIS — R0609 Other forms of dyspnea: Secondary | ICD-10-CM | POA: Diagnosis not present

## 2020-09-01 DIAGNOSIS — R0600 Dyspnea, unspecified: Secondary | ICD-10-CM

## 2020-09-12 ENCOUNTER — Ambulatory Visit: Payer: Medicare Other | Admitting: Cardiology

## 2020-09-12 ENCOUNTER — Encounter: Payer: Self-pay | Admitting: Cardiology

## 2020-09-12 ENCOUNTER — Other Ambulatory Visit: Payer: Self-pay

## 2020-09-12 ENCOUNTER — Other Ambulatory Visit (HOSPITAL_COMMUNITY): Payer: Self-pay | Admitting: Cardiology

## 2020-09-12 VITALS — BP 129/74 | HR 89 | Temp 98.4°F | Resp 16 | Ht 63.0 in | Wt 187.0 lb

## 2020-09-12 DIAGNOSIS — E782 Mixed hyperlipidemia: Secondary | ICD-10-CM

## 2020-09-12 DIAGNOSIS — R9439 Abnormal result of other cardiovascular function study: Secondary | ICD-10-CM | POA: Insufficient documentation

## 2020-09-12 MED ORDER — ASPIRIN EC 81 MG PO TBEC: 81.0000 mg | DELAYED_RELEASE_TABLET | Freq: Every day | ORAL | 3 refills | Status: DC

## 2020-09-12 NOTE — Progress Notes (Signed)
Patient referred by Tammy Mccreedy, MD for palpitations  Subjective:   Tammy Sandoval, female    DOB: 11-Jun-1955, 65 y.o.   MRN: [id number]   Chief Complaint  Patient presents with  . Congestive Heart Failure  . Symptomatic PVCs  . Follow-up  . Results    montior     HPI  65 y.o. Caucasian female with hypertension, mixed hyperlipidemia, exertional dyspnea, abnormal stress test  Reviewed recent echocardiogram and stress test results with the patient, details below.  Patient continues to have exertional dyspnea with walking up to half a mile.   Current Outpatient Medications on File Prior to Visit  Medication Sig Dispense Refill  . ARIPiprazole (ABILIFY) 15 MG tablet Take 7.5 mg by mouth daily.   0  . baclofen (LIORESAL) 10 MG tablet Take 1 tablet by mouth 3 (three) times daily as needed.    . diltiazem (CARDIZEM CD) 180 MG 24 hr capsule Take 1 capsule (180 mg total) by mouth daily. 90 capsule 3  . gabapentin (NEURONTIN) 600 MG tablet Take 1,200-1,800 mg by mouth See admin instructions. Take 1200 mg by mouth in the morning and 1800 mg in the evening  0  . hydrOXYzine (ATARAX/VISTARIL) 25 MG tablet Take 25 mg by mouth daily.    Marland Kitchen omeprazole (PRILOSEC) 40 MG capsule Take 1 capsule by mouth daily.    . rosuvastatin (CRESTOR) 10 MG tablet Take 1 tablet (10 mg total) by mouth daily. 30 tablet 3  . sacubitril-valsartan (ENTRESTO) 24-26 MG Take 1 tablet by mouth 2 (two) times daily. 60 tablet 1  . trazodone (DESYREL) 300 MG tablet Take 300 mg by mouth at bedtime.   0  . venlafaxine XR (EFFEXOR-XR) 150 MG 24 hr capsule Take 1 capsule (150 mg total) by mouth daily. (Patient taking differently: Take 300 mg by mouth daily.)     No current facility-administered medications on file prior to visit.    Cardiovascular and other pertinent studies:  Lexiscan Tetrofosmin stress test 09/01/2020: Lexiscan nuclear stress test performed using 1-day protocol.  SPECT images show apical  thinning, likely physiological, as well as small sized, mild intensity, partially reversible perfusion defect in basal anteroseptal myocardium. Stress LVEF calculated 33%, although visually appears low normal.  Intermediate risk study.    EKG 08/20/2020: Sinus rhythm 93 bpm Nonspecific T-abnormality  Echocardiogram 07/31/2020:  Left ventricle cavity is normal in size. Mild concentric hypertrophy of  the left ventricle. Mild global hypokinesis. LVEF 45-50%. Doppler evidence  of grade I (impaired) diastolic dysfunction, normal LAP.  Left atrial cavity is moderately dilated.  Mild (Grade I) mitral regurgitation.  Mild tricuspid regurgitation.  No evidence of pulmonary hypertension.  No significant change compared to previous study on 01/30/2020.  Abdominal Aortic Duplex 01/30/2020:  Normal abdominal aorta. No evidence of aneurysm. No significant  atherosclerotic changes noted. No AAA.  Minimal increase in the left common iliac artery.   EKG 12/26/2019: Sinus rhythm 84 bpm.  Frequent PVCs.  Recent labs: 12/26/2019: Glucose N/A, BUN/Cr 12/0.7. EGFR 81.  HbA1C 6.0% Chol 201, TG 92, HDL 48, LDL 136 TSH 2.3 normal  05/24/2019: Glucose 106, BUN/Cr 7/0.9. EGFR >60. Na/K 144/3.8.  H/H 11/35. MCV 93. Platelets 286 HbA1C 6.0% Chol 201, TG 92, HDL 48, LDL 136 TSH 2.3 normal   Review of Systems  Cardiovascular: Positive for palpitations. Negative for chest pain, dyspnea on exertion, leg swelling and syncope.         Vitals:   09/12/20 1126  BP:  129/74  Pulse: 89  Resp: 16  Temp: 98.4 F (36.9 C)  SpO2: 97%     Body mass index is 33.13 kg/m. Filed Weights   09/12/20 1126  Weight: 187 lb (84.8 kg)     Objective:   Physical Exam Vitals and nursing note reviewed.  Constitutional:      General: She is not in acute distress. Neck:     Vascular: No JVD.  Cardiovascular:     Rate and Rhythm: Normal rate and regular rhythm.     Heart sounds: Normal heart sounds. No murmur  heard.   Pulmonary:     Effort: Pulmonary effort is normal.     Breath sounds: Normal breath sounds. No wheezing or rales.          Assessment & Recommendations:   65 y.o. Caucasian female with hypertension, mixed hyperlipidemia, exertional dyspnea, abnormal stress test  Exertional dyspnea: Suspect HF with mildly reduced EF. CAD remains in the differential.  She does have intermediate distress to [place] as detailed above.  I discussed continued medical management versus coronary angiography with possible intervention, along with medical management.  Patient prefers to proceed with coronary angiography. Scheduled for cardiac catheterization with possible intervention.   .Schedule for cardiac catheterization, and possible angioplasty. We discussed regarding risks, benefits, alternatives to this including stress testing, CTA and continued medical therapy. Patient wants to proceed. Understands <1-2% risk of death, stroke, MI, urgent CABG, bleeding, infection, renal failure but not limited to these. Continue Entresto 24-26 mg bid, dltiazem 180 mg daily.   Recommend ASPirin 81 mg daily.  Mixed hyperlipidemia: Continue Crestor 10 mg    Nigel Mormon, MD Pager: [phone] Office: [phone]

## 2020-09-13 LAB — LIPID PANEL
Chol/HDL Ratio: 2.9 ratio (ref 0.0–4.4)
Cholesterol, Total: 158 mg/dL (ref 100–199)
HDL: 54 mg/dL (ref >39)
LDL Chol Calc (NIH): 86 mg/dL (ref 0–99)
Triglycerides: 97 mg/dL (ref 0–149)
VLDL Cholesterol Cal: 18 mg/dL (ref 5–40)

## 2020-09-13 LAB — BASIC METABOLIC PANEL
BUN/Creatinine Ratio: 10 — ABNORMAL LOW (ref 12–28)
BUN: 10 mg/dL (ref 8–27)
CO2: 27 mmol/L (ref 20–29)
Calcium: 9.7 mg/dL (ref 8.7–10.3)
Chloride: 98 mmol/L (ref 96–106)
Creatinine, Ser: 1 mg/dL (ref 0.57–1.00)
Glucose: 88 mg/dL (ref 65–99)
Potassium: 4.2 mmol/L (ref 3.5–5.2)
Sodium: 142 mmol/L (ref 134–144)
eGFR: 63 mL/min/{1.73_m2} (ref >59)

## 2020-09-13 LAB — CBC
Hematocrit: 44 % (ref 34.0–46.6)
Hemoglobin: 14.6 g/dL (ref 11.1–15.9)
MCH: 29.1 pg (ref 26.6–33.0)
MCHC: 33.2 g/dL (ref 31.5–35.7)
MCV: 88 fL (ref 79–97)
Platelets: 339 10*3/uL (ref 150–450)
RBC: 5.01 x10E6/uL (ref 3.77–5.28)
RDW: 14.2 % (ref 11.7–15.4)
WBC: 8.9 10*3/uL (ref 3.4–10.8)

## 2020-09-16 ENCOUNTER — Encounter (HOSPITAL_COMMUNITY)
Admission: RE | Disposition: A | Discharge status: Home / Self Care | Payer: Self-pay | Source: Home / Self Care | Attending: Cardiology

## 2020-09-16 ENCOUNTER — Ambulatory Visit (HOSPITAL_COMMUNITY)
Admission: RE | Admit: 2020-09-16 | Discharge: 2020-09-16 | Disposition: A | Discharge status: Home / Self Care | Payer: Medicare Other | Attending: Cardiology | Admitting: Cardiology

## 2020-09-16 ENCOUNTER — Other Ambulatory Visit: Payer: Self-pay

## 2020-09-16 DIAGNOSIS — Z7982 Long term (current) use of aspirin: Secondary | ICD-10-CM | POA: Insufficient documentation

## 2020-09-16 DIAGNOSIS — I10 Essential (primary) hypertension: Secondary | ICD-10-CM | POA: Insufficient documentation

## 2020-09-16 DIAGNOSIS — Z006 Encounter for examination for normal comparison and control in clinical research program: Secondary | ICD-10-CM

## 2020-09-16 DIAGNOSIS — R0609 Other forms of dyspnea: Secondary | ICD-10-CM | POA: Insufficient documentation

## 2020-09-16 DIAGNOSIS — R9439 Abnormal result of other cardiovascular function study: Secondary | ICD-10-CM | POA: Diagnosis present

## 2020-09-16 DIAGNOSIS — R0600 Dyspnea, unspecified: Secondary | ICD-10-CM | POA: Diagnosis present

## 2020-09-16 DIAGNOSIS — I2510 Atherosclerotic heart disease of native coronary artery without angina pectoris: Secondary | ICD-10-CM | POA: Insufficient documentation

## 2020-09-16 DIAGNOSIS — E782 Mixed hyperlipidemia: Secondary | ICD-10-CM | POA: Diagnosis not present

## 2020-09-16 DIAGNOSIS — Z79899 Other long term (current) drug therapy: Secondary | ICD-10-CM | POA: Diagnosis not present

## 2020-09-16 HISTORY — PX: RIGHT/LEFT HEART CATH AND CORONARY ANGIOGRAPHY: CATH118266

## 2020-09-16 LAB — BASIC METABOLIC PANEL
Anion gap: 8 (ref 5–15)
BUN: 11 mg/dL (ref 8–23)
CO2: 30 mmol/L (ref 22–32)
Calcium: 8.9 mg/dL (ref 8.9–10.3)
Chloride: 103 mmol/L (ref 98–111)
Creatinine, Ser: 0.94 mg/dL (ref 0.44–1.00)
GFR, Estimated: >60 mL/min (ref >60)
Glucose, Bld: 98 mg/dL (ref 70–99)
Potassium: 3.5 mmol/L (ref 3.5–5.1)
Sodium: 141 mmol/L (ref 135–145)

## 2020-09-16 SURGERY — RIGHT/LEFT HEART CATH AND CORONARY ANGIOGRAPHY
Anesthesia: LOCAL

## 2020-09-16 MED ORDER — SODIUM CHLORIDE 0.9 % WEIGHT BASED INFUSION
1.0000 mL/kg/h | INTRAVENOUS | Status: DC
  Administered 2020-09-16: 2.948 mL/kg/h via INTRAVENOUS

## 2020-09-16 MED ORDER — HEPARIN (PORCINE) IN NACL 1000-0.9 UT/500ML-% IV SOLN
INTRAVENOUS | Status: DC | PRN
  Administered 2020-09-16 (×2): 500 mL

## 2020-09-16 MED ORDER — HEPARIN (PORCINE) IN NACL 1000-0.9 UT/500ML-% IV SOLN
INTRAVENOUS | Status: AC
  Filled 2020-09-16: qty 500

## 2020-09-16 MED ORDER — FENTANYL CITRATE (PF) 100 MCG/2ML IJ SOLN
INTRAMUSCULAR | Status: DC | PRN
  Administered 2020-09-16: 25 ug via INTRAVENOUS

## 2020-09-16 MED ORDER — SODIUM CHLORIDE 0.9 % WEIGHT BASED INFUSION
3.0000 mL/kg/h | INTRAVENOUS | Status: AC
  Administered 2020-09-16: 3 mL/kg/h via INTRAVENOUS

## 2020-09-16 MED ORDER — LABETALOL HCL 5 MG/ML IV SOLN: 10.0000 mg | INTRAVENOUS | Status: DC | PRN

## 2020-09-16 MED ORDER — MIDAZOLAM HCL 2 MG/2ML IJ SOLN
INTRAMUSCULAR | Status: DC | PRN
  Administered 2020-09-16: 1 mg via INTRAVENOUS

## 2020-09-16 MED ORDER — SODIUM CHLORIDE 0.9% FLUSH: 3.0000 mL | Freq: Two times a day (BID) | INTRAVENOUS | Status: DC

## 2020-09-16 MED ORDER — SODIUM CHLORIDE 0.9% FLUSH: 3.0000 mL | INTRAVENOUS | Status: DC | PRN

## 2020-09-16 MED ORDER — MIDAZOLAM HCL 2 MG/2ML IJ SOLN
INTRAMUSCULAR | Status: AC
  Filled 2020-09-16: qty 2

## 2020-09-16 MED ORDER — FENTANYL CITRATE (PF) 100 MCG/2ML IJ SOLN
INTRAMUSCULAR | Status: DC | PRN
  Administered 2020-09-16: 50 ug via INTRAVENOUS

## 2020-09-16 MED ORDER — IOHEXOL 350 MG/ML SOLN
INTRAVENOUS | Status: DC | PRN
  Administered 2020-09-16: 25 mL

## 2020-09-16 MED ORDER — LIDOCAINE HCL (PF) 1 % IJ SOLN
INTRAMUSCULAR | Status: DC | PRN
  Administered 2020-09-16: 2 mL
  Administered 2020-09-16: 5 mL

## 2020-09-16 MED ORDER — HYDRALAZINE HCL 20 MG/ML IJ SOLN: 10.0000 mg | INTRAMUSCULAR | Status: DC | PRN

## 2020-09-16 MED ORDER — VERAPAMIL HCL 2.5 MG/ML IV SOLN
INTRAVENOUS | Status: DC | PRN
  Administered 2020-09-16: 10 mL via INTRA_ARTERIAL

## 2020-09-16 MED ORDER — SODIUM CHLORIDE 0.9 % IV SOLN: 250.0000 mL | INTRAVENOUS | Status: DC | PRN

## 2020-09-16 MED ORDER — ONDANSETRON HCL 4 MG/2ML IJ SOLN: 4.0000 mg | Freq: Four times a day (QID) | INTRAMUSCULAR | Status: DC | PRN

## 2020-09-16 MED ORDER — FENTANYL CITRATE (PF) 100 MCG/2ML IJ SOLN
INTRAMUSCULAR | Status: AC
  Filled 2020-09-16: qty 2

## 2020-09-16 MED ORDER — SODIUM CHLORIDE 0.9 % IV SOLN: INTRAVENOUS | Status: DC

## 2020-09-16 MED ORDER — HEPARIN SODIUM (PORCINE) 1000 UNIT/ML IJ SOLN
INTRAMUSCULAR | Status: DC | PRN
  Administered 2020-09-16: 4500 [IU] via INTRAVENOUS

## 2020-09-16 MED ORDER — VERAPAMIL HCL 2.5 MG/ML IV SOLN
INTRAVENOUS | Status: AC
  Filled 2020-09-16: qty 2

## 2020-09-16 MED ORDER — ASPIRIN 81 MG PO CHEW
81.0000 mg | CHEWABLE_TABLET | ORAL | Status: AC
  Administered 2020-09-16: 81 mg via ORAL
  Filled 2020-09-16: qty 1

## 2020-09-16 MED ORDER — ACETAMINOPHEN 325 MG PO TABS: 650.0000 mg | ORAL_TABLET | ORAL | Status: DC | PRN

## 2020-09-16 MED ORDER — LIDOCAINE HCL (PF) 1 % IJ SOLN
INTRAMUSCULAR | Status: AC
  Filled 2020-09-16: qty 30

## 2020-09-16 SURGICAL SUPPLY — 12 items
CATH 5FR JL3.5 JR4 ANG PIG MP (CATHETERS) ×2 IMPLANT
CATH BALLN WEDGE 5F 110CM (CATHETERS) ×2 IMPLANT
DEVICE RAD COMP TR BAND LRG (VASCULAR PRODUCTS) ×2 IMPLANT
GLIDESHEATH SLEND A-KIT 6F 22G (SHEATH) ×2 IMPLANT
GUIDEWIRE INQWIRE 1.5J.035X260 (WIRE) ×1 IMPLANT
INQWIRE 1.5J .035X260CM (WIRE) ×2
KIT HEART LEFT (KITS) ×2 IMPLANT
PACK CARDIAC CATHETERIZATION (CUSTOM PROCEDURE TRAY) ×2 IMPLANT
SHEATH GLIDE SLENDER 4/5FR (SHEATH) ×2 IMPLANT
SHEATH PROBE COVER 6X72 (BAG) ×2 IMPLANT
TRANSDUCER W/STOPCOCK (MISCELLANEOUS) ×2 IMPLANT
TUBING CIL FLEX 10 FLL-RA (TUBING) ×2 IMPLANT

## 2020-09-16 NOTE — Discharge Instructions (Signed)
Radial Site Care  This sheet gives you information about how to care for yourself after your procedure. Your health care provider may also give you more specific instructions. If you have problems or questions, contact your health care provider. What can I expect after the procedure? After the procedure, it is common to have:  Bruising and tenderness at the catheter insertion area. Follow these instructions at home: Medicines  Take over-the-counter and prescription medicines only as told by your health care provider. Insertion site care  Follow instructions from your health care provider about how to take care of your insertion site. Make sure you: ? Wash your hands with soap and water before you change your bandage (dressing). If soap and water are not available, use hand sanitizer. ? Change your dressing as told by your health care provider. ? Leave stitches (sutures), skin glue, or adhesive strips in place. These skin closures may need to stay in place for 2 weeks or longer. If adhesive strip edges start to loosen and curl up, you may trim the loose edges. Do not remove adhesive strips completely unless your health care provider tells you to do that.  Check your insertion site every day for signs of infection. Check for: ? Redness, swelling, or pain. ? Fluid or blood. ? Pus or a bad smell. ? Warmth.  Do not take baths, swim, or use a hot tub until your health care provider approves.  You may shower 24-48 hours after the procedure, or as directed by your health care provider. ? Remove the dressing and gently wash the site with plain soap and water. ? Pat the area dry with a clean towel. ? Do not rub the site. That could cause bleeding.  Do not apply powder or lotion to the site. Activity  For 24 hours after the procedure, or as directed by your health care provider: ? Do not flex or bend the affected arm. ? Do not push or pull heavy objects with the affected arm. ? Do not drive  yourself home from the hospital or clinic. You may drive 24 hours after the procedure unless your health care provider tells you not to. ? Do not operate machinery or power tools.  Do not lift anything that is heavier than 10 lb (4.5 kg), or the limit that you are told, until your health care provider says that it is safe.  Ask your health care provider when it is okay to: ? Return to work or school. ? Resume usual physical activities or sports. ? Resume sexual activity.   General instructions  If the catheter site starts to bleed, raise your arm and put firm pressure on the site. If the bleeding does not stop, get help right away. This is a medical emergency.  If you went home on the same day as your procedure, a responsible adult should be with you for the first 24 hours after you arrive home.  Keep all follow-up visits as told by your health care provider. This is important. Contact a health care provider if:  You have a fever.  You have redness, swelling, or yellow drainage around your insertion site. Get help right away if:  You have unusual pain at the radial site.  The catheter insertion area swells very fast.  The insertion area is bleeding, and the bleeding does not stop when you hold steady pressure on the area.  Your arm or hand becomes pale, cool, tingly, or numb. These symptoms may represent a serious   problem that is an emergency. Do not wait to see if the symptoms will go away. Get medical help right away. Call your local emergency services (911 in the [place] Do not drive yourself to the hospital. Summary  After the procedure, it is common to have bruising and tenderness at the site.  Follow instructions from your health care provider about how to take care of your radial site wound. Check the wound every day for signs of infection.  Do not lift anything that is heavier than 10 lb (4.5 kg), or the limit that you are told, until your health care provider says that it  is safe. This information is not intended to replace advice given to you by your health care provider. Make sure you discuss any questions you have with your health care provider. Document Revised: 05/11/2017 Document Reviewed: 05/11/2017 Elsevier Patient Education  2021 [employer]  

## 2020-09-16 NOTE — Research (Signed)
IDENTIFY Informed Consent                  Subject Name: Tammy Sandoval   Subject met inclusion and exclusion criteria.  The informed consent form, study requirements and expectations were reviewed with the subject and questions and concerns were addressed prior to the signing of the consent form.  The subject verbalized understanding of the trial requirements.  The subject agreed to participate in the IDENTIFY trial and signed the informed consent at 09:02AM on 09/16/20.  The informed consent was obtained prior to performance of any protocol-specific procedures for the subject.  A copy of the signed informed consent was given to the subject and a copy was placed in the subject's medical record.   Sallye Ober , Research Assistant

## 2020-09-16 NOTE — Progress Notes (Signed)
09/12/2020: Glucose 88. BUN/Cr 10/1.99. eGFR 63. Na/K 142/4.2.  H/H 14/44. MCV 88. Platelets 339

## 2020-09-16 NOTE — H&P (Signed)
OV 09/12/2020 copied for documentation     Patient referred by No ref. provider found for palpitations  Subjective:   Tammy Sandoval, female    DOB: 1956-01-13, 65 y.o.   MRN: [id number]  C/C: Exertional dyspnea Abnormal stress test   HPI  65 y.o. Caucasian female with hypertension, mixed hyperlipidemia, exertional dyspnea, abnormal stress test  Reviewed recent echocardiogram and stress test results with the patient, details below.  Patient continues to have exertional dyspnea with walking up to half a mile.   No current facility-administered medications on file prior to encounter.   Current Outpatient Medications on File Prior to Encounter  Medication Sig Dispense Refill  . ARIPiprazole (ABILIFY) 15 MG tablet Take 7.5 mg by mouth daily.   0  . baclofen (LIORESAL) 10 MG tablet Take 1 tablet by mouth 3 (three) times daily as needed.    . gabapentin (NEURONTIN) 600 MG tablet Take 1,200-1,800 mg by mouth See admin instructions. Take 1200 mg by mouth in the morning and 1800 mg in the evening  0  . hydrOXYzine (ATARAX/VISTARIL) 25 MG tablet Take 25 mg by mouth daily.    Marland Kitchen omeprazole (PRILOSEC) 40 MG capsule Take 1 capsule by mouth daily.    . rosuvastatin (CRESTOR) 10 MG tablet Take 1 tablet (10 mg total) by mouth daily. 30 tablet 3  . sacubitril-valsartan (ENTRESTO) 24-26 MG Take 1 tablet by mouth 2 (two) times daily. 60 tablet 1  . trazodone (DESYREL) 300 MG tablet Take 300 mg by mouth at bedtime.   0  . venlafaxine XR (EFFEXOR-XR) 150 MG 24 hr capsule Take 1 capsule (150 mg total) by mouth daily. (Patient taking differently: Take 300 mg by mouth daily.)    . aspirin EC 81 MG tablet Take 1 tablet (81 mg total) by mouth daily. Swallow whole. 30 tablet 3  . diltiazem (CARDIZEM CD) 180 MG 24 hr capsule Take 1 capsule (180 mg total) by mouth daily. 90 capsule 3    Cardiovascular and other pertinent studies:  Lexiscan Tetrofosmin stress test 09/01/2020: Lexiscan nuclear stress test  performed using 1-day protocol.  SPECT images show apical thinning, likely physiological, as well as small sized, mild intensity, partially reversible perfusion defect in basal anteroseptal myocardium. Stress LVEF calculated 33%, although visually appears low normal.  Intermediate risk study.    EKG 08/20/2020: Sinus rhythm 93 bpm Nonspecific T-abnormality  Echocardiogram 07/31/2020:  Left ventricle cavity is normal in size. Mild concentric hypertrophy of  the left ventricle. Mild global hypokinesis. LVEF 45-50%. Doppler evidence  of grade I (impaired) diastolic dysfunction, normal LAP.  Left atrial cavity is moderately dilated.  Mild (Grade I) mitral regurgitation.  Mild tricuspid regurgitation.  No evidence of pulmonary hypertension.  No significant change compared to previous study on 01/30/2020.  Abdominal Aortic Duplex 01/30/2020:  Normal abdominal aorta. No evidence of aneurysm. No significant  atherosclerotic changes noted. No AAA.  Minimal increase in the left common iliac artery.   EKG 12/26/2019: Sinus rhythm 84 bpm.  Frequent PVCs.  Recent labs: 12/26/2019: Glucose N/A, BUN/Cr 12/0.7. EGFR 81.  HbA1C 6.0% Chol 201, TG 92, HDL 48, LDL 136 TSH 2.3 normal  05/24/2019: Glucose 106, BUN/Cr 7/0.9. EGFR >60. Na/K 144/3.8.  H/H 11/35. MCV 93. Platelets 286 HbA1C 6.0% Chol 201, TG 92, HDL 48, LDL 136 TSH 2.3 normal   Review of Systems  Cardiovascular: Positive for palpitations. Negative for chest pain, dyspnea on exertion, leg swelling and syncope.  Vitals:   09/16/20 0828  BP: 139/88  Pulse: 95  Resp: 18  Temp: 97.9 F (36.6 C)  SpO2: 98%     Body mass index is 31.89 kg/m. Filed Weights   09/16/20 0828  Weight: 81.6 kg     Objective:   Physical Exam Vitals and nursing note reviewed.  Constitutional:      General: She is not in acute distress. Neck:     Vascular: No JVD.  Cardiovascular:     Rate and Rhythm: Normal rate and regular rhythm.      Heart sounds: Normal heart sounds. No murmur heard.   Pulmonary:     Effort: Pulmonary effort is normal.     Breath sounds: Normal breath sounds. No wheezing or rales.          Assessment & Recommendations:   65 y.o. Caucasian female with hypertension, mixed hyperlipidemia, exertional dyspnea, abnormal stress test  Exertional dyspnea: Suspect HF with mildly reduced EF. CAD remains in the differential.  She does have intermediate distress to [place] as detailed above.  I discussed continued medical management versus coronary angiography with possible intervention, along with medical management.  Patient prefers to proceed with coronary angiography. Scheduled for cardiac catheterization with possible intervention.   .Schedule for cardiac catheterization, and possible angioplasty. We discussed regarding risks, benefits, alternatives to this including stress testing, CTA and continued medical therapy. Patient wants to proceed. Understands <1-2% risk of death, stroke, MI, urgent CABG, bleeding, infection, renal failure but not limited to these. Continue Entresto 24-26 mg bid, dltiazem 180 mg daily.   Recommend ASPirin 81 mg daily.  Mixed hyperlipidemia: Continue Crestor 10 mg    Nigel Mormon, MD Pager: [phone] Office: [phone]

## 2020-09-16 NOTE — Interval H&P Note (Signed)
History and Physical Interval Note:  09/16/2020 11:16 AM  Tammy Sandoval  has presented today for surgery, with the diagnosis of chf.  The various methods of treatment have been discussed with the patient and family. After consideration of risks, benefits and other options for treatment, the patient has consented to  Procedure(s): RIGHT/LEFT HEART CATH AND CORONARY ANGIOGRAPHY (N/A) as a surgical intervention.  The patient's history has been reviewed, patient examined, no change in status, stable for surgery.  I have reviewed the patient's chart and labs.  Questions were answered to the patient's satisfaction.    2016/2017 Appropriate Use Criteria for Coronary Revascularization Clinical Presentation: Diabetes Mellitus? Symptom Status? S/P CABG? Antianginal Therapy (# of long-acting drugs)? Results of Non-invasive testing? FFR/iFR results in all diseased vessels? Patient undergoing renal transplant? Patient undergoing percutaneous valve procedure (TAVR, MitraClip, Others)? Symptom Status:  Ischemic Symptoms  Non-invasive Testing:  Intermediate Risk  If no or indeterminate stress test, FFR/iFR results in all diseased vessels:  N/A  Diabetes Mellitus:  No  S/P CABG:  No  Antianginal therapy (number of long-acting drugs):  >=2  Patient undergoing renal transplant:  No  Patient undergoing percutaneous valve procedure:  No    newline 1 Vessel Disease PCI CABG  No proximal LAD involvement, No proximal left dominant LCX involvement A (8); Indication 2 M (6); Indication 2   Proximal left dominant LCX involvement A (8); Indication 5 A (8); Indication 5   Proximal LAD involvement A (8); Indication 5 A (8); Indication 5   newline 2 Vessel Disease  No proximal LAD involvement A (8); Indication 8 A (7); Indication 8   Proximal LAD involvement A (8); Indication 11 A (8); Indication 11   newline 3 Vessel Disease  Low disease complexity (e.g., focal stenoses, SYNTAX <=22) A (8); Indication 17 A  (8); Indication 17   Intermediate or high disease complexity (e.g., SYNTAX >=23) M (6); Indication 21 A (9); Indication 21   newline Left Main Disease  Isolated LMCA disease: ostial or midshaft A (7); Indication 24 A (9); Indication 24   Isolated LMCA disease: bifurcation involvement M (6); Indication 25 A (9); Indication 25   LMCA ostial or midshaft, concurrent low disease burden multivessel disease (e.g., 1-2 additional focal stenoses, SYNTAX <=22) A (7); Indication 26 A (9); Indication 26   LMCA ostial or midshaft, concurrent intermediate or high disease burden multivessel disease (e.g., 1-2 additional bifurcation stenoses, long stenoses, SYNTAX >=23) M (4); Indication 27 A (9); Indication 27   LMCA bifurcation involvement, concurrent low disease burden multivessel disease (e.g., 1-2 additional focal stenoses, SYNTAX <=22) M (6); Indication 28 A (9); Indication 28   LMCA bifurcation involvement, concurrent intermediate or high disease burden multivessel disease (e.g., 1-2 additional bifurcation stenoses, long stenoses, SYNTAX >=23) R (3); Indication 29 A (9); Indication 29     Notes: A indicates appropriate. M indicates may be appropriate. R indicates rarely appropriate. Number in parentheses is median score for that indication. Reclassify indicates number of functionally diseased vessels should be decreased given negative FFR/iFR. Re-evaluate the scenario interpreting any FFR/iFR negative vessel as being not significantly stenosed. Disease means involved vessel provides flow to a sufficient amount of myocardium to be clinically important. If FFR testing indicates a vessel is not significant, that vessel should not be considered diseased (and the patient should be reclassified with respect to extent of functionally significant disease). Proximal LAD + proximal left dominant LCX is considered 3 vessel CAD 2 Vessel CAD with FFR/iFR  abnormal in only 1 but not both is considered 1 vessel CAD Disease  complexity includes occlusion, bifurcation, trifurcation, ostial, >20 mm, tortuosity, calcification, thrombus LMCA disease is >=50% by angiography, MLD <2.8 mm, MLA <6 mm2; MLA 6-7.5 mm2 requires further physiologic See Table B for risk stratification based on noninvasive testing   Journal of the [employer] of Cardiology Mar 2017, 23391; DOI: 10.1016/j.[email or web address] [email or web address].full-text.pdf  [employer]

## 2020-09-17 ENCOUNTER — Encounter (HOSPITAL_COMMUNITY): Payer: Self-pay | Admitting: Cardiology

## 2020-09-17 LAB — POCT I-STAT 7, (LYTES, BLD GAS, ICA,H+H)
Acid-Base Excess: 4 mmol/L — ABNORMAL HIGH (ref 0.0–2.0)
Bicarbonate: 30.7 mmol/L — ABNORMAL HIGH (ref 20.0–28.0)
Calcium, Ion: 1.15 mmol/L (ref 1.15–1.40)
HCT: 39 % (ref 36.0–46.0)
Hemoglobin: 13.3 g/dL (ref 12.0–15.0)
O2 Saturation: 99 %
Potassium: 3.5 mmol/L (ref 3.5–5.1)
Sodium: 145 mmol/L (ref 135–145)
TCO2: 32 mmol/L (ref 22–32)
pCO2 arterial: 54.3 mmHg — ABNORMAL HIGH (ref 32.0–48.0)
pH, Arterial: 7.36 (ref 7.350–7.450)
pO2, Arterial: 152 mmHg — ABNORMAL HIGH (ref 83.0–108.0)

## 2020-09-17 LAB — POCT I-STAT EG7
Acid-Base Excess: 4 mmol/L — ABNORMAL HIGH (ref 0.0–2.0)
Acid-Base Excess: 4 mmol/L — ABNORMAL HIGH (ref 0.0–2.0)
Bicarbonate: 30.3 mmol/L — ABNORMAL HIGH (ref 20.0–28.0)
Bicarbonate: 30.8 mmol/L — ABNORMAL HIGH (ref 20.0–28.0)
Calcium, Ion: 1.13 mmol/L — ABNORMAL LOW (ref 1.15–1.40)
Calcium, Ion: 1.14 mmol/L — ABNORMAL LOW (ref 1.15–1.40)
HCT: 38 % (ref 36.0–46.0)
HCT: 38 % (ref 36.0–46.0)
Hemoglobin: 12.9 g/dL (ref 12.0–15.0)
Hemoglobin: 12.9 g/dL (ref 12.0–15.0)
O2 Saturation: 74 %
O2 Saturation: 83 %
Potassium: 3.4 mmol/L — ABNORMAL LOW (ref 3.5–5.1)
Potassium: 3.5 mmol/L (ref 3.5–5.1)
Sodium: 145 mmol/L (ref 135–145)
Sodium: 145 mmol/L (ref 135–145)
TCO2: 32 mmol/L (ref 22–32)
TCO2: 32 mmol/L (ref 22–32)
pCO2, Ven: 52.9 mmHg (ref 44.0–60.0)
pCO2, Ven: 55 mmHg (ref 44.0–60.0)
pH, Ven: 7.356 (ref 7.250–7.430)
pH, Ven: 7.366 (ref 7.250–7.430)
pO2, Ven: 42 mmHg (ref 32.0–45.0)
pO2, Ven: 50 mmHg — ABNORMAL HIGH (ref 32.0–45.0)

## 2020-10-11 IMAGING — DX DG KNEE 1-2V PORT*L*
2 series · 2 of 2 positions shown · non-contrast
Comparison: None.

CLINICAL DATA: Status post left knee arthroplasty

EXAM:
PORTABLE LEFT KNEE - 2 VIEW

[knee ap]
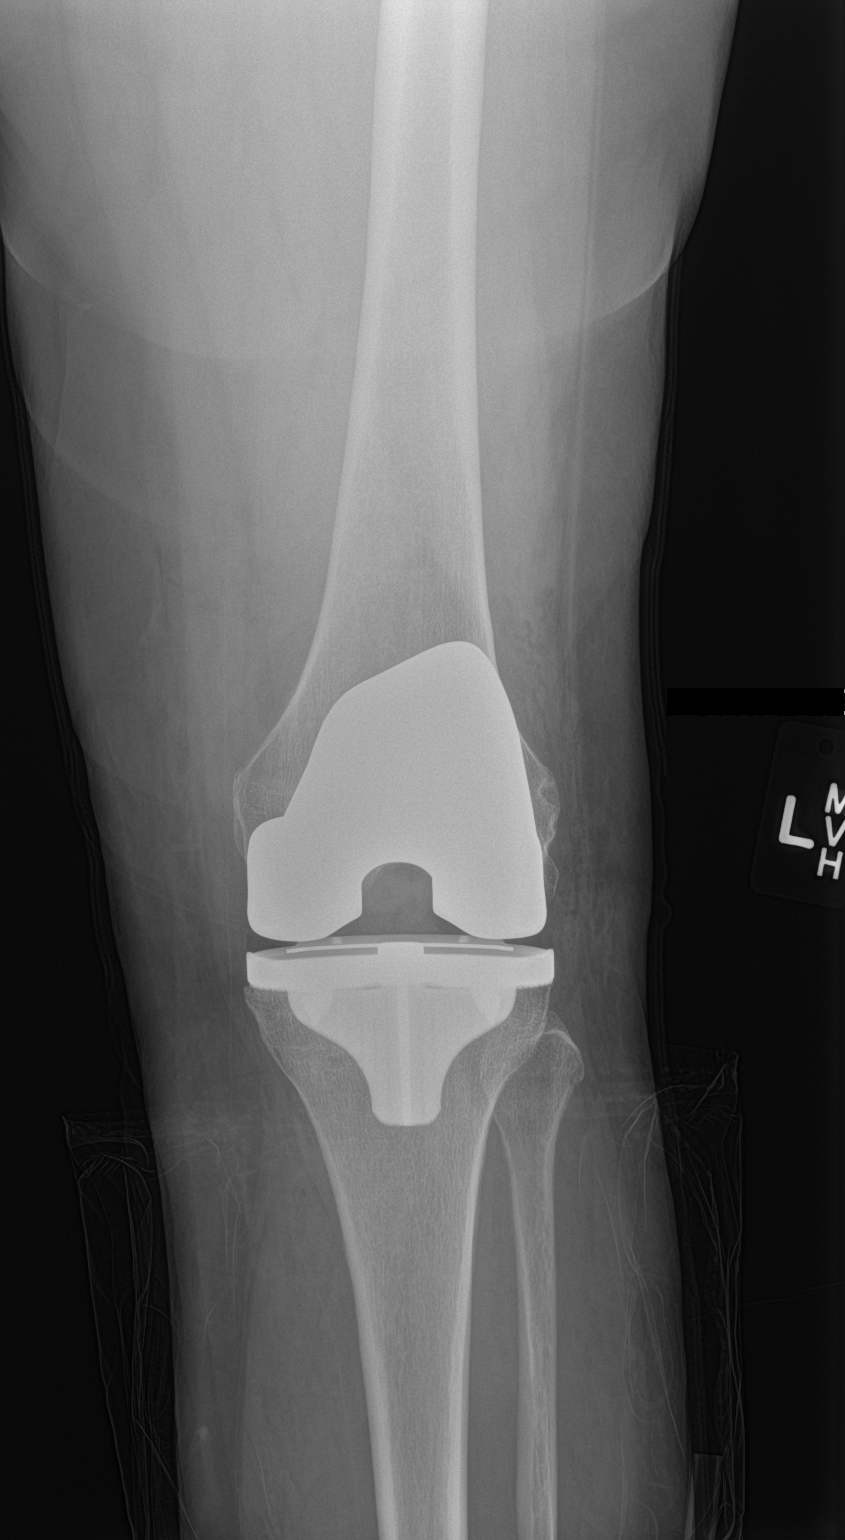

[knee lat]
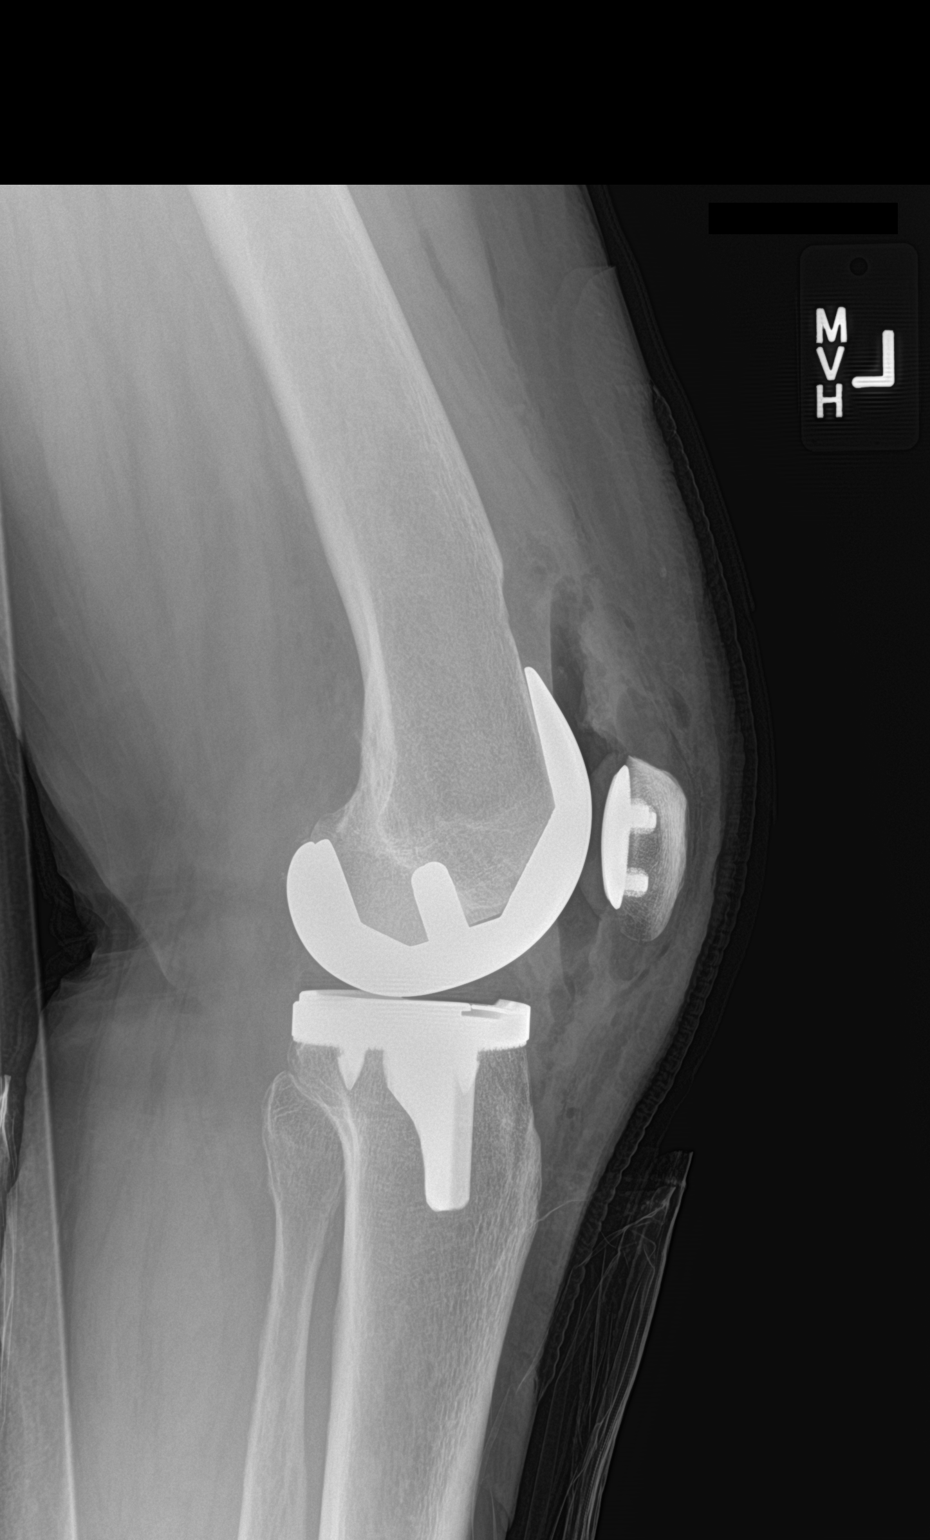

[2 of 2 positions shown; findings below may reference images not displayed]

FINDINGS: Left knee replacement is noted in satisfactory position. No acute
bony or soft tissue abnormality is noted.
IMPRESSION: Status post left knee replacement

## 2020-10-12 NOTE — Progress Notes (Signed)
Patient referred by [name], MD for palpitations  Subjective:   Tammy Sandoval, female    DOB: 1955/05/19, 65 y.o.   MRN: [id number]   Chief Complaint  Patient presents with   Coronary Artery Disease   Follow-up    Post cath     HPI  65 y.o. Caucasian female with hypertension, mixed hyperlipidemia, exertional dyspnea, abnormal stress test  Reviewed recent echocardiogram and stress test results with the patient, details below. Coronary angiogram did not show significant CAD. Leg swelling and dyspnea persists.  Current Outpatient Medications on File Prior to Visit  Medication Sig Dispense Refill   ARIPiprazole (ABILIFY) 15 MG tablet Take 7.5 mg by mouth daily.   0   aspirin EC 81 MG tablet Take 1 tablet (81 mg total) by mouth daily. Swallow whole. 30 tablet 3   baclofen (LIORESAL) 10 MG tablet Take 1 tablet by mouth 3 (three) times daily as needed.     diltiazem (CARDIZEM CD) 180 MG 24 hr capsule Take 1 capsule (180 mg total) by mouth daily. 90 capsule 3   gabapentin (NEURONTIN) 600 MG tablet Take 1,200-1,800 mg by mouth See admin instructions. Take 1200 mg by mouth in the morning and 1800 mg in the evening  0   hydrOXYzine (ATARAX/VISTARIL) 25 MG tablet Take 25 mg by mouth daily.     omeprazole (PRILOSEC) 40 MG capsule Take 1 capsule by mouth daily.     rosuvastatin (CRESTOR) 10 MG tablet Take 1 tablet (10 mg total) by mouth daily. 30 tablet 3   sacubitril-valsartan (ENTRESTO) 24-26 MG Take 1 tablet by mouth 2 (two) times daily. 60 tablet 1   trazodone (DESYREL) 300 MG tablet Take 300 mg by mouth at bedtime.   0   venlafaxine XR (EFFEXOR-XR) 150 MG 24 hr capsule Take 1 capsule (150 mg total) by mouth daily. (Patient taking differently: Take 300 mg by mouth daily.)     No current facility-administered medications on file prior to visit.    Cardiovascular and other pertinent studies:  RHC/LHC 09/16/2020: LM: Essentially nonexistent with dual ostia for LAD and  LCx LAD: Normal LCx: Mid focal 40% stenosis RCA: Normal   Normal filling pressures   Single vessel mod CAD without pulmonary hypertension Unlikely to be the cause for her exertional dyspnea Recommend medial management Seek alternate cause for exertional dyspnea  Lexiscan Tetrofosmin stress test 09/01/2020: Lexiscan nuclear stress test performed using 1-day protocol.  SPECT images show apical thinning, likely physiological, as well as small sized, mild intensity, partially reversible perfusion defect in basal anteroseptal myocardium. Stress LVEF calculated 33%, although visually appears low normal.  Intermediate risk study.    EKG 08/20/2020: Sinus rhythm 93 bpm Nonspecific T-abnormality  Echocardiogram 07/31/2020:  Left ventricle cavity is normal in size. Mild concentric hypertrophy of  the left ventricle. Mild global hypokinesis. LVEF 45-50%. Doppler evidence  of grade I (impaired) diastolic dysfunction, normal LAP.  Left atrial cavity is moderately dilated.  Mild (Grade I) mitral regurgitation.  Mild tricuspid regurgitation.  No evidence of pulmonary hypertension.  No significant change compared to previous study on 01/30/2020.  Abdominal Aortic Duplex 01/30/2020:  Normal abdominal aorta. No evidence of aneurysm.  No significant  atherosclerotic changes noted. No AAA.  Minimal increase in the left common iliac artery.   EKG 12/26/2019: Sinus rhythm 84 bpm.  Frequent PVCs.  Recent labs: 12/26/2019: Glucose N/A, BUN/Cr 12/0.7. EGFR 81.  HbA1C 6.0% Chol 201, TG 92, HDL 48, LDL 136 TSH 2.3  normal  05/24/2019: Glucose 106, BUN/Cr 7/0.9. EGFR >60. Na/K 144/3.8.  H/H 11/35. MCV 93. Platelets 286 HbA1C 6.0% Chol 201, TG 92, HDL 48, LDL 136 TSH 2.3 normal   Review of Systems  Cardiovascular:  Positive for dyspnea on exertion and leg swelling. Negative for chest pain, palpitations and syncope.        Vitals:   10/13/20 1421  BP: 114/72  Pulse: 96  Resp: 16  Temp: 98.2  F (36.8 C)  SpO2: 97%    Body mass index is 33.66 kg/m. Filed Weights   10/13/20 1421  Weight: 190 lb (86.2 kg)     Objective:   Physical Exam Vitals and nursing note reviewed.  Constitutional:      General: She is not in acute distress. Neck:     Vascular: No JVD.  Cardiovascular:     Rate and Rhythm: Normal rate and regular rhythm.     Heart sounds: Normal heart sounds. No murmur heard. Pulmonary:     Effort: Pulmonary effort is normal.     Breath sounds: Normal breath sounds. No wheezing or rales.  Musculoskeletal:     Right lower leg: Edema (2+) present.     Left lower leg: Edema (2+) present.         Assessment & Recommendations:   65 y.o. Caucasian female with hypertension, mixed hyperlipidemia, exertional dyspnea, abnormal stress test  Exertional dyspnea: HFmrEF No obstructive CAD Continue Entresto 24-26 mg bid Switch diltiazem 180 mg daily to spironolactone 25 mg daily. Check BMP in 1 week  Mixed hyperlipidemia: Continue Crestor 10 mg  F/u in 4 week   Nigel Mormon, MD Pager: [phone] Office: [phone]

## 2020-10-13 ENCOUNTER — Ambulatory Visit: Payer: Medicare Other | Admitting: Cardiology

## 2020-10-13 ENCOUNTER — Encounter: Payer: Self-pay | Admitting: Cardiology

## 2020-10-13 ENCOUNTER — Other Ambulatory Visit: Payer: Self-pay

## 2020-10-13 VITALS — BP 114/72 | HR 96 | Temp 98.2°F | Resp 16 | Ht 63.0 in | Wt 190.0 lb

## 2020-10-13 DIAGNOSIS — E782 Mixed hyperlipidemia: Secondary | ICD-10-CM

## 2020-10-13 DIAGNOSIS — R9439 Abnormal result of other cardiovascular function study: Secondary | ICD-10-CM

## 2020-10-13 DIAGNOSIS — R0609 Other forms of dyspnea: Secondary | ICD-10-CM

## 2020-10-13 DIAGNOSIS — I5020 Unspecified systolic (congestive) heart failure: Secondary | ICD-10-CM | POA: Insufficient documentation

## 2020-10-13 MED ORDER — SPIRONOLACTONE 25 MG PO TABS
25.0000 mg | ORAL_TABLET | Freq: Every day | ORAL | 3 refills | Status: DC
Start: 2020-10-13 — End: 2021-02-20

## 2020-10-14 ENCOUNTER — Other Ambulatory Visit: Payer: Self-pay | Admitting: Cardiology

## 2020-10-14 DIAGNOSIS — I5032 Chronic diastolic (congestive) heart failure: Secondary | ICD-10-CM

## 2020-10-21 DIAGNOSIS — Q829 Congenital malformation of skin, unspecified: Secondary | ICD-10-CM | POA: Diagnosis not present

## 2020-10-21 DIAGNOSIS — R002 Palpitations: Secondary | ICD-10-CM | POA: Diagnosis not present

## 2020-10-21 DIAGNOSIS — D509 Iron deficiency anemia, unspecified: Secondary | ICD-10-CM | POA: Diagnosis not present

## 2020-10-21 DIAGNOSIS — G629 Polyneuropathy, unspecified: Secondary | ICD-10-CM | POA: Diagnosis not present

## 2020-10-21 DIAGNOSIS — I872 Venous insufficiency (chronic) (peripheral): Secondary | ICD-10-CM | POA: Diagnosis not present

## 2020-10-21 DIAGNOSIS — R0602 Shortness of breath: Secondary | ICD-10-CM | POA: Diagnosis not present

## 2020-10-21 DIAGNOSIS — M542 Cervicalgia: Secondary | ICD-10-CM | POA: Diagnosis not present

## 2020-10-21 DIAGNOSIS — K589 Irritable bowel syndrome without diarrhea: Secondary | ICD-10-CM | POA: Diagnosis not present

## 2020-10-21 DIAGNOSIS — E782 Mixed hyperlipidemia: Secondary | ICD-10-CM | POA: Diagnosis not present

## 2020-10-21 DIAGNOSIS — R7303 Prediabetes: Secondary | ICD-10-CM | POA: Diagnosis not present

## 2020-10-22 DIAGNOSIS — R9439 Abnormal result of other cardiovascular function study: Secondary | ICD-10-CM | POA: Diagnosis not present

## 2020-10-23 LAB — BASIC METABOLIC PANEL
BUN/Creatinine Ratio: 13 (ref 12–28)
BUN: 11 mg/dL (ref 8–27)
CO2: 25 mmol/L (ref 20–29)
Calcium: 9.7 mg/dL (ref 8.7–10.3)
Chloride: 106 mmol/L (ref 96–106)
Creatinine, Ser: 0.88 mg/dL (ref 0.57–1.00)
Glucose: 101 mg/dL — ABNORMAL HIGH (ref 65–99)
Potassium: 4.3 mmol/L (ref 3.5–5.2)
Sodium: 144 mmol/L (ref 134–144)
eGFR: 73 mL/min/{1.73_m2} (ref >59)

## 2020-11-13 ENCOUNTER — Inpatient Hospital Stay: Payer: Medicare Other

## 2020-11-13 ENCOUNTER — Other Ambulatory Visit: Payer: Self-pay

## 2020-11-13 ENCOUNTER — Ambulatory Visit: Payer: Medicare Other | Admitting: Cardiology

## 2020-11-13 ENCOUNTER — Encounter: Payer: Self-pay | Admitting: Cardiology

## 2020-11-13 VITALS — BP 130/60 | HR 89 | Temp 99.0°F | Resp 16 | Ht 63.0 in | Wt 188.0 lb

## 2020-11-13 DIAGNOSIS — I5020 Unspecified systolic (congestive) heart failure: Secondary | ICD-10-CM | POA: Diagnosis not present

## 2020-11-13 DIAGNOSIS — I493 Ventricular premature depolarization: Secondary | ICD-10-CM | POA: Diagnosis not present

## 2020-11-13 DIAGNOSIS — E782 Mixed hyperlipidemia: Secondary | ICD-10-CM

## 2020-11-13 MED ORDER — ROSUVASTATIN CALCIUM 10 MG PO TABS: 10.0000 mg | ORAL_TABLET | Freq: Every day | ORAL | 3 refills | Status: DC

## 2020-11-13 NOTE — Progress Notes (Signed)
Patient referred by [name], MD for palpitations  Subjective:   Tammy Sandoval, female    DOB: 1955/10/26, 65 y.o.   MRN: [id number]   Chief Complaint  Patient presents with   HFrEF (heart failure with reduced ejection fraction)   Leg Swelling     HPI  65 y.o. Caucasian female with hypertension, mixed hyperlipidemia, exertional dyspnea, abnormal stress test  Patient's exertional dyspnea has significantly improved.  She still has leg swelling.  She admits to eating high salt diet.  Current Outpatient Medications on File Prior to Visit  Medication Sig Dispense Refill   ARIPiprazole (ABILIFY) 15 MG tablet Take 7.5 mg by mouth daily.   0   baclofen (LIORESAL) 10 MG tablet Take 1 tablet by mouth 3 (three) times daily as needed.     ENTRESTO 24-26 MG TAKE 1 TABLET BY MOUTH TWICE DAILY 60 tablet 1   gabapentin (NEURONTIN) 600 MG tablet Take 1,200-1,800 mg by mouth See admin instructions. Take 1200 mg by mouth in the morning and 1800 mg in the evening  0   hydrOXYzine (ATARAX/VISTARIL) 25 MG tablet Take 25 mg by mouth daily.     omeprazole (PRILOSEC) 40 MG capsule Take 1 capsule by mouth daily.     rosuvastatin (CRESTOR) 10 MG tablet Take 1 tablet (10 mg total) by mouth daily. 30 tablet 3   spironolactone (ALDACTONE) 25 MG tablet Take 1 tablet (25 mg total) by mouth daily. 30 tablet 3   trazodone (DESYREL) 300 MG tablet Take 300 mg by mouth at bedtime.   0   venlafaxine XR (EFFEXOR-XR) 150 MG 24 hr capsule Take 1 capsule (150 mg total) by mouth daily. (Patient taking differently: Take 300 mg by mouth daily.)     No current facility-administered medications on file prior to visit.    Cardiovascular and other pertinent studies:  RHC/LHC 09/16/2020: LM: Essentially nonexistent with dual ostia for LAD and LCx LAD: Normal LCx: Mid focal 40% stenosis RCA: Normal   Normal filling pressures   Single vessel mod CAD without pulmonary hypertension Unlikely to be the cause  for her exertional dyspnea Recommend medial management Seek alternate cause for exertional dyspnea  Lexiscan Tetrofosmin stress test 09/01/2020: Lexiscan nuclear stress test performed using 1-day protocol.  SPECT images show apical thinning, likely physiological, as well as small sized, mild intensity, partially reversible perfusion defect in basal anteroseptal myocardium. Stress LVEF calculated 33%, although visually appears low normal.  Intermediate risk study.    EKG 08/20/2020: Sinus rhythm 93 bpm Nonspecific T-abnormality  Echocardiogram 07/31/2020:  Left ventricle cavity is normal in size. Mild concentric hypertrophy of  the left ventricle. Mild global hypokinesis. LVEF 45-50%. Doppler evidence  of grade I (impaired) diastolic dysfunction, normal LAP.  Left atrial cavity is moderately dilated.  Mild (Grade I) mitral regurgitation.  Mild tricuspid regurgitation.  No evidence of pulmonary hypertension.  No significant change compared to previous study on 01/30/2020.  Abdominal Aortic Duplex 01/30/2020:  Normal abdominal aorta. No evidence of aneurysm.  No significant  atherosclerotic changes noted. No AAA.  Minimal increase in the left common iliac artery.   EKG 12/26/2019: Sinus rhythm 84 bpm.  Frequent PVCs.  Recent labs: 10/22/2020: Glucose 101, BUN/Cr 11/0.88. EGFR 73. Na/K 143/4.3.   09/12/2020: Chol 158, TG 97, HDL 54, LDL 86  12/26/2019: Glucose N/A, BUN/Cr 12/0.7. EGFR 81.  HbA1C 6.0% Chol 201, TG 92, HDL 48, LDL 136 TSH 2.3 normal  05/24/2019: Glucose 106, BUN/Cr 7/0.9. EGFR >60. Na/K  144/3.8.  H/H 11/35. MCV 93. Platelets 286 HbA1C 6.0% Chol 201, TG 92, HDL 48, LDL 136 TSH 2.3 normal   Review of Systems  Cardiovascular:  Positive for dyspnea on exertion and leg swelling. Negative for chest pain, palpitations and syncope.        Vitals:   11/13/20 0903 11/13/20 0907  BP: 95/60 130/60  Pulse: 99 89  Resp: 16   Temp: 99 F (37.2 C)   SpO2: 97%      Body mass index is 33.3 kg/m. Filed Weights   11/13/20 0903  Weight: 188 lb (85.3 kg)     Objective:   Physical Exam Vitals and nursing note reviewed.  Constitutional:      General: She is not in acute distress. Neck:     Vascular: No JVD.  Cardiovascular:     Rate and Rhythm: Normal rate and regular rhythm. FrequentExtrasystoles are present.    Heart sounds: Normal heart sounds. No murmur heard. Pulmonary:     Effort: Pulmonary effort is normal.     Breath sounds: Normal breath sounds. No wheezing or rales.  Musculoskeletal:     Right lower leg: Edema (Trace) present.     Left lower leg: Edema (Trace) present.         Assessment & Recommendations:   65 y.o. Caucasian female with hypertension, mixed hyperlipidemia, exertional dyspnea, abnormal stress test  Exertional dyspnea: HFmrEF No obstructive CAD Continue Entresto 24-26 mg bid, spironolactone 25 mg daily. Counseled regarding low-salt diet.  PVC: Still has frequent PVCs on exam.  Check 2-week cardiac telemetry to assess PVC burden.  This could be potential cause of her mildly reduced ejection fraction.  Mixed hyperlipidemia: Refilled Crestor 10 mg Check lipid panel in 3 months.  F/u in 3 months    Nigel Mormon, MD Pager: [phone] Office: [phone]

## 2020-11-18 DIAGNOSIS — R0602 Shortness of breath: Secondary | ICD-10-CM | POA: Diagnosis not present

## 2020-11-18 DIAGNOSIS — G629 Polyneuropathy, unspecified: Secondary | ICD-10-CM | POA: Diagnosis not present

## 2020-11-18 DIAGNOSIS — Z0001 Encounter for general adult medical examination with abnormal findings: Secondary | ICD-10-CM | POA: Diagnosis not present

## 2020-11-18 DIAGNOSIS — E782 Mixed hyperlipidemia: Secondary | ICD-10-CM | POA: Diagnosis not present

## 2020-11-18 DIAGNOSIS — R7303 Prediabetes: Secondary | ICD-10-CM | POA: Diagnosis not present

## 2020-11-18 DIAGNOSIS — I872 Venous insufficiency (chronic) (peripheral): Secondary | ICD-10-CM | POA: Diagnosis not present

## 2020-11-18 DIAGNOSIS — D509 Iron deficiency anemia, unspecified: Secondary | ICD-10-CM | POA: Diagnosis not present

## 2020-11-18 DIAGNOSIS — R002 Palpitations: Secondary | ICD-10-CM | POA: Diagnosis not present

## 2020-11-18 DIAGNOSIS — M542 Cervicalgia: Secondary | ICD-10-CM | POA: Diagnosis not present

## 2020-11-18 DIAGNOSIS — K589 Irritable bowel syndrome without diarrhea: Secondary | ICD-10-CM | POA: Diagnosis not present

## 2020-11-21 DIAGNOSIS — I493 Ventricular premature depolarization: Secondary | ICD-10-CM | POA: Diagnosis not present

## 2020-12-08 DIAGNOSIS — E782 Mixed hyperlipidemia: Secondary | ICD-10-CM | POA: Diagnosis not present

## 2020-12-08 DIAGNOSIS — Z1231 Encounter for screening mammogram for malignant neoplasm of breast: Secondary | ICD-10-CM | POA: Diagnosis not present

## 2020-12-09 DIAGNOSIS — I493 Ventricular premature depolarization: Secondary | ICD-10-CM | POA: Diagnosis not present

## 2020-12-09 LAB — LIPID PANEL
Chol/HDL Ratio: 3 ratio (ref 0.0–4.4)
Cholesterol, Total: 133 mg/dL (ref 100–199)
HDL: 45 mg/dL (ref >39)
LDL Chol Calc (NIH): 68 mg/dL (ref 0–99)
Triglycerides: 111 mg/dL (ref 0–149)
VLDL Cholesterol Cal: 20 mg/dL (ref 5–40)

## 2020-12-18 ENCOUNTER — Other Ambulatory Visit: Payer: Self-pay | Admitting: Cardiology

## 2020-12-18 ENCOUNTER — Institutional Professional Consult (permissible substitution): Payer: Medicare Other | Admitting: Student

## 2020-12-18 DIAGNOSIS — I493 Ventricular premature depolarization: Secondary | ICD-10-CM

## 2020-12-18 MED ORDER — METOPROLOL SUCCINATE ER 25 MG PO TB24: 25.0000 mg | ORAL_TABLET | Freq: Every day | ORAL | 3 refills | Status: DC

## 2020-12-18 NOTE — Progress Notes (Signed)
Still has 11.5% PVC burden. Will try metoprolol succinate 25 mg daily.  Thanks MJP

## 2021-01-02 ENCOUNTER — Ambulatory Visit: Payer: Medicare Other | Admitting: Pulmonary Disease

## 2021-01-02 ENCOUNTER — Ambulatory Visit (INDEPENDENT_AMBULATORY_CARE_PROVIDER_SITE_OTHER): Payer: Medicare Other

## 2021-01-02 ENCOUNTER — Encounter: Payer: Self-pay | Admitting: Pulmonary Disease

## 2021-01-02 ENCOUNTER — Other Ambulatory Visit: Payer: Self-pay

## 2021-01-02 VITALS — BP 120/72 | HR 79 | Ht 63.0 in | Wt 188.0 lb

## 2021-01-02 DIAGNOSIS — R0602 Shortness of breath: Secondary | ICD-10-CM

## 2021-01-02 NOTE — Progress Notes (Signed)
@Patient  ID: Tammy Sandoval, female    DOB: [birth year], 65 y.o.   MRN: [id number]  Chief Complaint  Patient presents with   Consult    Referred by PCP for SOB. States the SOB has been doing for at least 6 months. Notices the SOB with exertion, difficult to take a shower at times due to the SOB. Denies any coughing associated with the SOB.     Referring provider: Trey Sailors, PA  HPI:   65 y.o. woman whom we are seeing in consultation for dyspnea on exertion.  Most recent cardiology note x2 reviewed.  She reports 106-monthhistory of dyspnea on exertion.  Worse with inclines or stairs but present on flat surfaces as well.  No time of day where things are better or worse.  No positional changes make things better or worse.  No seasonal environmental factors that make things better or worse.  No pets at home.  No new medicines.  No alleviating or exacerbating factors.  She denies atopic symptoms, minimal to no seasonal allergy symptoms.  Work-up has included echocardiogram most recently 11/13/2020 that showed stable slightly reduced EF, diastolic dysfunction, mitral valve regurgitation, left atrial dilation.  Abnormal stress test 5/22 on my review.  This resulted in the left or right heart cath 09/06/2020 that on my review revealed no significant CAD, mean PA pressure 9 normal wedge, LVEDP of 10.  No chest imaging to review.  PMH: Cardiomyopathy EF slightly low 45 to 50%, mitral valve regurgitation, diastolic dysfunction, peripheral neuropathy in the feet Surgical history: Appendectomy, lumpectomy, cervical disc surgery, C-section, knee replacement Family history: Mother with pancreatic and breast cancer, diabetes, CAD, father with CAD, hyperlipidemia, hypertension   Questionaires / Pulmonary Flowsheets:   ACT:  No flowsheet data found.  MMRC: No flowsheet data found.  Epworth:  No flowsheet data found.  Tests:   FENO:  No results found for: NITRICOXIDE  PFT: No flowsheet  data found.  WALK:  No flowsheet data found.  Imaging: Chest x-ray ordered  Lab Results: Personally reviewed, no anemia CBC    Component Value Date/Time   WBC 8.9 09/12/2020 1306   WBC 6.1 06/13/2019 0343   RBC 5.01 09/12/2020 1306   RBC 3.79 (L) 06/13/2019 0343   HGB 12.9 09/16/2020 1141   HGB 14.6 09/12/2020 1306   HCT 38.0 09/16/2020 1141   HCT 44.0 09/12/2020 1306   PLT 339 09/12/2020 1306   MCV 88 09/12/2020 1306   MCH 29.1 09/12/2020 1306   MCH 29.8 06/13/2019 0343   MCHC 33.2 09/12/2020 1306   MCHC 32.0 06/13/2019 0343   RDW 14.2 09/12/2020 1306   LYMPHSABS 1.5 10/05/2017 1107   MONOABS 0.3 10/05/2017 1107   EOSABS 0.2 10/05/2017 1107   BASOSABS 0.0 10/05/2017 1107    BMET    Component Value Date/Time   NA 144 10/22/2020 0945   K 4.3 10/22/2020 0945   CL 106 10/22/2020 0945   CO2 25 10/22/2020 0945   GLUCOSE 101 (H) 10/22/2020 0945   GLUCOSE 98 09/16/2020 0958   BUN 11 10/22/2020 0945   CREATININE 0.88 10/22/2020 0945   CREATININE 0.92 10/05/2017 1107   CALCIUM 9.7 10/22/2020 0945   GFRNONAA >60 09/16/2020 0958   GFRNONAA >60 10/05/2017 1107   GFRAA >60 06/13/2019 0343   GFRAA >60 10/05/2017 1107    BNP No results found for: BNP  ProBNP No results found for: PROBNP  Specialty Problems       Pulmonary Problems  Exertional dyspnea    Allergies  Allergen Reactions   Latex Swelling    Redness,swelling   Wellbutrin [Bupropion Hcl] Itching and Other (See Comments)    HEAD TINGLES INTOLERANCE: MADE PT. LOOPY    Immunization History  Administered Date(s) Administered   Influenza Split 09/21/2016   Influenza, Seasonal, Injecte, Preservative Fre 01/26/2018   PFIZER(Purple Top)SARS-COV-2 Vaccination 07/12/2019, 08/02/2019    Past Medical History:  Diagnosis Date   Anemia    Anxiety    Arthritis    Cancer ([place])    skin-surgically removed   Crescendo transient ischemic attacks    pt has no idea about this, has never had TIA's or  strokes   Depression    Dyspnea    with exertion   Generalized headaches    GERD (gastroesophageal reflux disease)    Insomnia    Irritable bowel syndrome    mostly constipation   Leg pain    Low back pain    Obesity    Palpitations    Pre-diabetes    hx of    Restless leg    Seizures ([place]) 2013   only one she has ever had, placed on neurontin   Thrombophlebitis    Ulcer    RLE   Varicose veins     Tobacco History: Social History   Tobacco Use  Smoking Status Never  Smokeless Tobacco Never   Counseling given: Not Answered   Continue to not smoke  Outpatient Encounter Medications as of 01/02/2021  Medication Sig   ARIPiprazole (ABILIFY) 15 MG tablet Take 7.5 mg by mouth daily.    baclofen (LIORESAL) 10 MG tablet Take 1 tablet by mouth 3 (three) times daily as needed.   ENTRESTO 24-26 MG TAKE 1 TABLET BY MOUTH TWICE DAILY   gabapentin (NEURONTIN) 600 MG tablet Take 1,200-1,800 mg by mouth See admin instructions. Take 1200 mg by mouth in the morning and 1800 mg in the evening   hydrOXYzine (ATARAX/VISTARIL) 25 MG tablet Take 25 mg by mouth daily.   metoprolol succinate (TOPROL-XL) 25 MG 24 hr tablet Take 1 tablet (25 mg total) by mouth daily. Take with or immediately following a meal.   omeprazole (PRILOSEC) 40 MG capsule Take 1 capsule by mouth daily.   rosuvastatin (CRESTOR) 10 MG tablet Take 1 tablet (10 mg total) by mouth daily.   spironolactone (ALDACTONE) 25 MG tablet Take 1 tablet (25 mg total) by mouth daily.   trazodone (DESYREL) 300 MG tablet Take 300 mg by mouth at bedtime.    venlafaxine XR (EFFEXOR-XR) 150 MG 24 hr capsule Take 1 capsule (150 mg total) by mouth daily. (Patient taking differently: Take 300 mg by mouth daily.)   No facility-administered encounter medications on file as of 01/02/2021.     Review of Systems  Review of Systems  No chest pain with exertion.  No orthopnea or PND.  Lower extremity swelling is stable.  Comprehensive review of  systems otherwise negative. Physical Exam  BP 120/72 (BP Location: Left Arm, Patient Position: Sitting, Cuff Size: Normal)   Pulse 79   Ht 5' 3"  (1.6 m)   Wt 188 lb (85.3 kg)   SpO2 96% Comment: on RA  BMI 33.30 kg/m   Wt Readings from Last 5 Encounters:  01/02/21 188 lb (85.3 kg)  11/13/20 188 lb (85.3 kg)  10/13/20 190 lb (86.2 kg)  09/16/20 180 lb (81.6 kg)  09/12/20 187 lb (84.8 kg)    BMI Readings from Last 5 Encounters:  01/02/21 33.30 kg/m  11/13/20 33.30 kg/m  10/13/20 33.66 kg/m  09/16/20 31.89 kg/m  09/12/20 33.13 kg/m     Physical Exam General: Well-appearing, no acute distress Eyes: EOMI, icteric Neck: Supple, no JVP Cardiovascular: Regular rate and rhythm, no murmur Pulmonary: Clear, no crackles, no wheezes, normal work of breathing Abdomen: Nondistended, bowel sounds present MSK: No synovitis, joint effusion Neuro: Normal gait, no weakness Psych: Normal mood, full affect   Assessment & Plan:   Dyspnea on exertion: Suspect multifactorial with cardiac contribution including mildly reduced EF, left atrial dilation, diastolic dysfunction, mitral regurgitation leading to exertional dyspnea given normal blood pressure response to exercise that will worsen all the above.  In addition, high suspicion for deconditioning given less active in the setting of her neuropathy over the last year.  Lastly, concern for weight contributing given 30 pound weight gain over the timeframe.  She denies any atopic symptoms, low suspicion for asthma.  She is a never smoker.  Will obtain chest x-ray today to evaluate parenchyma.  Pulmonary function test ordered for further evaluation to see if can identify pulmonary contributor.  Consider empiric trial of bronchodilators but nothing on history to suggest this would be helpful.  Cardiomyopathy, diastolic dysfunction, mitral regurgitation: Appears fairly euvolemic on exam today.  Follows with cardiology.   Return in about 4 weeks  (around 01/30/2021).   Lanier Clam, MD 01/02/2021

## 2021-01-02 NOTE — Patient Instructions (Addendum)
Nice to meet you  We will get a chest xray today  We will schedule pulmonary function tests at your convenience in the coming days  Return in 3-4 weeks for follow up with Dr Silas Flood

## 2021-01-13 ENCOUNTER — Other Ambulatory Visit: Payer: Self-pay | Admitting: *Deleted

## 2021-01-13 DIAGNOSIS — R0602 Shortness of breath: Secondary | ICD-10-CM

## 2021-01-14 ENCOUNTER — Other Ambulatory Visit: Payer: Self-pay

## 2021-01-14 ENCOUNTER — Ambulatory Visit (INDEPENDENT_AMBULATORY_CARE_PROVIDER_SITE_OTHER): Payer: Medicare Other | Admitting: Pulmonary Disease

## 2021-01-14 DIAGNOSIS — R0602 Shortness of breath: Secondary | ICD-10-CM | POA: Diagnosis not present

## 2021-01-14 LAB — PULMONARY FUNCTION TEST
DL/VA % pred: 117 %
DL/VA: 4.93 ml/min/mmHg/L
DLCO cor % pred: 98 %
DLCO cor: 18.85 ml/min/mmHg
DLCO unc % pred: 98 %
DLCO unc: 18.85 ml/min/mmHg
FEF 25-75 Post: 3.07 L/sec
FEF 25-75 Pre: 1.93 L/sec
FEF2575-%Change-Post: 58 %
FEF2575-%Pred-Post: 148 %
FEF2575-%Pred-Pre: 93 %
FEV1-%Change-Post: 15 %
FEV1-%Pred-Post: 78 %
FEV1-%Pred-Pre: 67 %
FEV1-Post: 1.82 L
FEV1-Pre: 1.57 L
FEV1FVC-%Change-Post: 1 %
FEV1FVC-%Pred-Pre: 109 %
FEV6-%Change-Post: 13 %
FEV6-%Pred-Post: 72 %
FEV6-%Pred-Pre: 63 %
FEV6-Post: 2.11 L
FEV6-Pre: 1.86 L
FEV6FVC-%Pred-Post: 104 %
FEV6FVC-%Pred-Pre: 104 %
FVC-%Change-Post: 13 %
FVC-%Pred-Post: 69 %
FVC-%Pred-Pre: 61 %
FVC-Post: 2.11 L
FVC-Pre: 1.86 L
Post FEV1/FVC ratio: 86 %
Post FEV6/FVC ratio: 100 %
Pre FEV1/FVC ratio: 85 %
Pre FEV6/FVC Ratio: 100 %
RV % pred: 90 %
RV: 1.84 L
TLC % pred: 82 %
TLC: 4.06 L

## 2021-01-14 NOTE — Progress Notes (Signed)
Full PFT performed today. °

## 2021-01-14 NOTE — Patient Instructions (Signed)
Full PFT performed today. °

## 2021-01-15 ENCOUNTER — Other Ambulatory Visit: Payer: Self-pay | Admitting: Pulmonary Disease

## 2021-01-19 DIAGNOSIS — R7303 Prediabetes: Secondary | ICD-10-CM | POA: Diagnosis not present

## 2021-01-19 DIAGNOSIS — M542 Cervicalgia: Secondary | ICD-10-CM | POA: Diagnosis not present

## 2021-01-19 DIAGNOSIS — R002 Palpitations: Secondary | ICD-10-CM | POA: Diagnosis not present

## 2021-01-19 DIAGNOSIS — D509 Iron deficiency anemia, unspecified: Secondary | ICD-10-CM | POA: Diagnosis not present

## 2021-01-19 DIAGNOSIS — F418 Other specified anxiety disorders: Secondary | ICD-10-CM | POA: Diagnosis not present

## 2021-01-19 DIAGNOSIS — E782 Mixed hyperlipidemia: Secondary | ICD-10-CM | POA: Diagnosis not present

## 2021-01-19 DIAGNOSIS — G629 Polyneuropathy, unspecified: Secondary | ICD-10-CM | POA: Diagnosis not present

## 2021-01-19 DIAGNOSIS — I872 Venous insufficiency (chronic) (peripheral): Secondary | ICD-10-CM | POA: Diagnosis not present

## 2021-01-19 DIAGNOSIS — E669 Obesity, unspecified: Secondary | ICD-10-CM | POA: Diagnosis not present

## 2021-01-29 ENCOUNTER — Other Ambulatory Visit: Payer: Self-pay

## 2021-01-30 ENCOUNTER — Ambulatory Visit: Payer: Medicare HMO | Admitting: Pulmonary Disease

## 2021-02-03 DIAGNOSIS — B027 Disseminated zoster: Secondary | ICD-10-CM | POA: Diagnosis not present

## 2021-02-10 ENCOUNTER — Ambulatory Visit: Payer: Medicare HMO | Admitting: Pulmonary Disease

## 2021-02-10 ENCOUNTER — Other Ambulatory Visit: Payer: Self-pay

## 2021-02-10 ENCOUNTER — Encounter: Payer: Self-pay | Admitting: Pulmonary Disease

## 2021-02-10 VITALS — BP 120/78 | HR 85 | Ht 63.0 in | Wt 186.0 lb

## 2021-02-10 DIAGNOSIS — R0609 Other forms of dyspnea: Secondary | ICD-10-CM | POA: Diagnosis not present

## 2021-02-10 DIAGNOSIS — J4530 Mild persistent asthma, uncomplicated: Secondary | ICD-10-CM

## 2021-02-10 MED ORDER — FLUTICASONE FUROATE-VILANTEROL 200-25 MCG/ACT IN AEPB: 1.0000 | INHALATION_SPRAY | Freq: Every day | RESPIRATORY_TRACT | 3 refills | Status: DC

## 2021-02-10 MED ORDER — ALBUTEROL SULFATE HFA 108 (90 BASE) MCG/ACT IN AERS: 2.0000 | INHALATION_SPRAY | Freq: Four times a day (QID) | RESPIRATORY_TRACT | 6 refills | Status: DC | PRN

## 2021-02-10 NOTE — Patient Instructions (Signed)
Nice to see you  Tour breathing tests got better with albuterol. This can happen with asthma.  Use breo 1 puff daily. Rinse mouth after very use.  Use albuterol 2 puff twice as needed for shortness of breath.  Return to clinic in 3 months or sooner as needed.

## 2021-02-10 NOTE — Progress Notes (Signed)
@Patient  ID: Tammy Sandoval, female    DOB: [birth year], 65 y.o.   MRN: [id number]  Chief Complaint  Patient presents with   Follow-up    SOB has improved    Referring provider: [name], MD  HPI:   65 y.o. woman whom we are seeing in follow up for dyspnea on exertion.    No change in symptoms since last visit.  She endorses dieting with weight loss.  She was congratulated on this.  Reviewed in depth results of her pulmonary function test.  These demonstrated normal spirometry with significant bronchodilator response in both FEV1 and FVC, lung volumes within the limits, DLCO within normal limits.  Consistent with diagnosis of asthma.  Discussed at length the role and rationale for maintenance inhaler with ICS/LABA.  She is amenable to therapy today.  Also amenable to rescue inhaler given improved with albuterol PFTs.  HPI at initial visit: She reports 65-monthhistory of dyspnea on exertion.  Worse with inclines or stairs but present on flat surfaces as well.  No time of day where things are better or worse.  No positional changes make things better or worse.  No seasonal environmental factors that make things better or worse.  No pets at home.  No new medicines.  No alleviating or exacerbating factors.  She denies atopic symptoms, minimal to no seasonal allergy symptoms.  Work-up has included echocardiogram most recently 11/13/2020 that showed stable slightly reduced EF, diastolic dysfunction, mitral valve regurgitation, left atrial dilation.  Abnormal stress test 5/22 on my review.  This resulted in the left or right heart cath 09/06/2020 that on my review revealed no significant CAD, mean PA pressure 9 normal wedge, LVEDP of 10.  No chest imaging to review.  PMH: Cardiomyopathy EF slightly low 45 to 50%, mitral valve regurgitation, diastolic dysfunction, peripheral neuropathy in the feet Surgical history: Appendectomy, lumpectomy, cervical disc surgery, C-section, knee  replacement Family history: Mother with pancreatic and breast cancer, diabetes, CAD, father with CAD, hyperlipidemia, hypertension   Questionaires / Pulmonary Flowsheets:   ACT:  No flowsheet data found.  MMRC: No flowsheet data found.  Epworth:  No flowsheet data found.  Tests:   FENO:  No results found for: NITRICOXIDE  PFT: PFT Results Latest Ref Rng & Units 01/14/2021  FVC-Pre L 1.86  FVC-Predicted Pre % 61  FVC-Post L 2.11  FVC-Predicted Post % 69  Pre FEV1/FVC % % 85  Post FEV1/FCV % % 86  FEV1-Pre L 1.57  FEV1-Predicted Pre % 67  FEV1-Post L 1.82  DLCO uncorrected ml/min/mmHg 18.85  DLCO UNC% % 98  DLCO corrected ml/min/mmHg 18.85  DLCO COR %Predicted % 98  DLVA Predicted % 117  TLC L 4.06  TLC % Predicted % 82  RV % Predicted % 90  Personally reviewed and interpreted as normal spirometry with significant bronchodilator response in both FEV1 and FVC.  Lung volumes within normal limits.  DLCO within normal limits.  Consistent with asthma.  WALK:  No flowsheet data found.  Imaging: Chest x-ray ordered  Lab Results: Personally reviewed, no anemia CBC    Component Value Date/Time   WBC 8.9 09/12/2020 1306   WBC 6.1 06/13/2019 0343   RBC 5.01 09/12/2020 1306   RBC 3.79 (L) 06/13/2019 0343   HGB 12.9 09/16/2020 1141   HGB 14.6 09/12/2020 1306   HCT 38.0 09/16/2020 1141   HCT 44.0 09/12/2020 1306   PLT 339 09/12/2020 1306   MCV 88 09/12/2020 1306   MCH  29.1 09/12/2020 1306   MCH 29.8 06/13/2019 0343   MCHC 33.2 09/12/2020 1306   MCHC 32.0 06/13/2019 0343   RDW 14.2 09/12/2020 1306   LYMPHSABS 1.5 10/05/2017 1107   MONOABS 0.3 10/05/2017 1107   EOSABS 0.2 10/05/2017 1107   BASOSABS 0.0 10/05/2017 1107    BMET    Component Value Date/Time   NA 144 10/22/2020 0945   K 4.3 10/22/2020 0945   CL 106 10/22/2020 0945   CO2 25 10/22/2020 0945   GLUCOSE 101 (H) 10/22/2020 0945   GLUCOSE 98 09/16/2020 0958   BUN 11 10/22/2020 0945   CREATININE  0.88 10/22/2020 0945   CREATININE 0.92 10/05/2017 1107   CALCIUM 9.7 10/22/2020 0945   GFRNONAA >60 09/16/2020 0958   GFRNONAA >60 10/05/2017 1107   GFRAA >60 06/13/2019 0343   GFRAA >60 10/05/2017 1107    BNP No results found for: BNP  ProBNP No results found for: PROBNP  Specialty Problems       Pulmonary Problems   Exertional dyspnea    Allergies  Allergen Reactions   Latex Swelling    Redness,swelling   Wellbutrin [Bupropion Hcl] Itching and Other (See Comments)    HEAD TINGLES INTOLERANCE: MADE PT. LOOPY    Immunization History  Administered Date(s) Administered   Influenza Split 09/21/2016   Influenza, Seasonal, Injecte, Preservative Fre 01/26/2018   PFIZER(Purple Top)SARS-COV-2 Vaccination 07/12/2019, 08/02/2019    Past Medical History:  Diagnosis Date   Anemia    Anxiety    Arthritis    Cancer ([place])    skin-surgically removed   Crescendo transient ischemic attacks    pt has no idea about this, has never had TIA's or strokes   Depression    Dyspnea    with exertion   Generalized headaches    GERD (gastroesophageal reflux disease)    Insomnia    Irritable bowel syndrome    mostly constipation   Leg pain    Low back pain    Obesity    Palpitations    Pre-diabetes    hx of    Restless leg    Seizures ([place]) 2013   only one she has ever had, placed on neurontin   Thrombophlebitis    Ulcer    RLE   Varicose veins     Tobacco History: Social History   Tobacco Use  Smoking Status Never  Smokeless Tobacco Never   Counseling given: Not Answered   Continue to not smoke  Outpatient Encounter Medications as of 02/10/2021  Medication Sig   albuterol (VENTOLIN HFA) 108 (90 Base) MCG/ACT inhaler Inhale 2 puffs into the lungs every 6 (six) hours as needed for wheezing or shortness of breath.   ARIPiprazole (ABILIFY) 15 MG tablet Take 7.5 mg by mouth daily.    baclofen (LIORESAL) 10 MG tablet Take 1 tablet by mouth 3 (three) times daily as  needed.   ENTRESTO 24-26 MG TAKE 1 TABLET BY MOUTH TWICE DAILY   fluticasone furoate-vilanterol (BREO ELLIPTA) 200-25 MCG/ACT AEPB Inhale 1 puff into the lungs daily.   gabapentin (NEURONTIN) 600 MG tablet Take 1,200-1,800 mg by mouth See admin instructions. Take 1200 mg by mouth in the morning and 1800 mg in the evening   hydrOXYzine (ATARAX/VISTARIL) 25 MG tablet Take 25 mg by mouth daily.   metoprolol succinate (TOPROL-XL) 25 MG 24 hr tablet Take 1 tablet (25 mg total) by mouth daily. Take with or immediately following a meal.   omeprazole (PRILOSEC) 40 MG capsule  Take 1 capsule by mouth daily.   rosuvastatin (CRESTOR) 10 MG tablet Take 1 tablet (10 mg total) by mouth daily.   trazodone (DESYREL) 300 MG tablet Take 300 mg by mouth at bedtime.    venlafaxine XR (EFFEXOR-XR) 150 MG 24 hr capsule Take 1 capsule (150 mg total) by mouth daily. (Patient taking differently: Take 300 mg by mouth daily.)   spironolactone (ALDACTONE) 25 MG tablet Take 1 tablet (25 mg total) by mouth daily.   No facility-administered encounter medications on file as of 02/10/2021.     Review of Systems  Review of Systems  N/a Physical Exam  BP 120/78 (BP Location: Right Arm, Cuff Size: Normal)   Pulse 85   Ht 5' 3"  (1.6 m)   Wt 186 lb (84.4 kg)   SpO2 96%   BMI 32.95 kg/m   Wt Readings from Last 5 Encounters:  02/10/21 186 lb (84.4 kg)  01/02/21 188 lb (85.3 kg)  11/13/20 188 lb (85.3 kg)  10/13/20 190 lb (86.2 kg)  09/16/20 180 lb (81.6 kg)    BMI Readings from Last 5 Encounters:  02/10/21 32.95 kg/m  01/02/21 33.30 kg/m  11/13/20 33.30 kg/m  10/13/20 33.66 kg/m  09/16/20 31.89 kg/m     Physical Exam General: Well-appearing, no acute distress Eyes: EOMI, icteric Neck: Supple, no JVP Cardiovascular: Regular rate and rhythm, no murmur Pulmonary: Clear, no crackles, no wheezes, normal work of breathing Abdomen: Nondistended, bowel sounds present MSK: No synovitis, joint  effusion Neuro: Normal gait, no weakness Psych: Normal mood, full affect   Assessment & Plan:   Dyspnea on exertion: Suspect multifactorial with cardiac contribution including mildly reduced EF, left atrial dilation, diastolic dysfunction, mitral regurgitation leading to exertional dyspnea given normal blood pressure response to exercise that will worsen all the above.  In addition, high suspicion for deconditioning given less active in the setting of her neuropathy over the last year.  Lastly, concern for weight contributing given 30 pound weight gain over the timeframe.  She endorses weight loss efforts with good results so far.  She was congratulated on this.  While denies any atopic symptoms, spirometry consistent with significant bronchodilator response which is consistent with asthma.  She is a never smoker.    Asthma: Based on PFT, bronchodilator spots.  No atopic symptoms.  High-dose Anoro as well as albuterol as needed prescribed today.  Cardiomyopathy, diastolic dysfunction, mitral regurgitation: Appears fairly euvolemic on exam today.  Follows with cardiology.   Return in about 3 months (around 05/13/2021).   Lanier Clam, MD 02/10/2021

## 2021-02-16 ENCOUNTER — Ambulatory Visit: Payer: Medicare Other | Admitting: Cardiology

## 2021-02-16 ENCOUNTER — Encounter: Payer: Self-pay | Admitting: Cardiology

## 2021-02-16 ENCOUNTER — Ambulatory Visit: Payer: Medicare HMO | Admitting: Cardiology

## 2021-02-16 ENCOUNTER — Other Ambulatory Visit: Payer: Self-pay

## 2021-02-16 VITALS — BP 119/59 | HR 90 | Temp 98.5°F | Resp 16 | Ht 63.0 in | Wt 189.0 lb

## 2021-02-16 DIAGNOSIS — I493 Ventricular premature depolarization: Secondary | ICD-10-CM

## 2021-02-16 DIAGNOSIS — I5020 Unspecified systolic (congestive) heart failure: Secondary | ICD-10-CM

## 2021-02-16 MED ORDER — METOPROLOL SUCCINATE ER 50 MG PO TB24: 50.0000 mg | ORAL_TABLET | Freq: Every day | ORAL | 3 refills | Status: DC

## 2021-02-16 NOTE — Progress Notes (Signed)
Patient referred by [name], MD for palpitations  Subjective:   Tammy Sandoval, female    DOB: 23-Mar-1956, 65 y.o.   MRN: [id number]   Chief Complaint  Patient presents with   HFrEF (heart failure with reduced ejection fraction)   PVC (premature ventricular contraction)   Follow-up    3 month     HPI  65 y.o. Caucasian female with hypertension, mixed hyperlipidemia, exertional dyspnea, abnormal stress test  Patient continues to have episodes of exertional dyspnea from time to time.  Reviewed recent monitor results, details below.  Current Outpatient Medications on File Prior to Visit  Medication Sig Dispense Refill   albuterol (VENTOLIN HFA) 108 (90 Base) MCG/ACT inhaler Inhale 2 puffs into the lungs every 6 (six) hours as needed for wheezing or shortness of breath. 8 g 6   ARIPiprazole (ABILIFY) 15 MG tablet Take 7.5 mg by mouth daily.   0   baclofen (LIORESAL) 10 MG tablet Take 1 tablet by mouth 3 (three) times daily as needed.     ENTRESTO 24-26 MG TAKE 1 TABLET BY MOUTH TWICE DAILY 60 tablet 1   fluticasone furoate-vilanterol (BREO ELLIPTA) 200-25 MCG/ACT AEPB Inhale 1 puff into the lungs daily. 60 each 3   gabapentin (NEURONTIN) 600 MG tablet Take 1,200-1,800 mg by mouth See admin instructions. Take 1200 mg by mouth in the morning and 1800 mg in the evening  0   hydrOXYzine (ATARAX/VISTARIL) 25 MG tablet Take 25 mg by mouth daily.     metoprolol succinate (TOPROL-XL) 25 MG 24 hr tablet Take 1 tablet (25 mg total) by mouth daily. Take with or immediately following a meal. 30 tablet 3   omeprazole (PRILOSEC) 40 MG capsule Take 1 capsule by mouth daily.     rosuvastatin (CRESTOR) 10 MG tablet Take 1 tablet (10 mg total) by mouth daily. 90 tablet 3   spironolactone (ALDACTONE) 25 MG tablet Take 1 tablet (25 mg total) by mouth daily. 30 tablet 3   trazodone (DESYREL) 300 MG tablet Take 300 mg by mouth at bedtime.   0   venlafaxine XR (EFFEXOR-XR) 150 MG 24 hr  capsule Take 1 capsule (150 mg total) by mouth daily. (Patient taking differently: Take 300 mg by mouth daily.)     No current facility-administered medications on file prior to visit.    Cardiovascular and other pertinent studies:  EKG 02/16/2021: Sinus rhythm 88 bpm Ventricular trigeminy  Mobile cardiac telemetry 4 days 11/13/2020 - 11/17/2020: Dominant rhythm: Sinus. HR 62-133 bpm. Avg HR 91 bpm, in sinus rhythm. 1 episodes of SVT, at 154 bpm for 5 beats. <1% isolated SVE, couplet, no triplets. 0 episodes of VT. 11.5% isolated VE-including ventricular bigeminy and trigeminy, <1 couplet/triplets. No atrial fibrillation/atrial flutter/VT/high grade AV block, sinus pause >3sec noted. 0 patient triggered events.     RHC/LHC 09/16/2020: LM: Essentially nonexistent with dual ostia for LAD and LCx LAD: Normal LCx: Mid focal 40% stenosis RCA: Normal   Normal filling pressures   Single vessel mod CAD without pulmonary hypertension Unlikely to be the cause for her exertional dyspnea Recommend medial management Seek alternate cause for exertional dyspnea  Lexiscan Tetrofosmin stress test 09/01/2020: Lexiscan nuclear stress test performed using 1-day protocol.  SPECT images show apical thinning, likely physiological, as well as small sized, mild intensity, partially reversible perfusion defect in basal anteroseptal myocardium. Stress LVEF calculated 33%, although visually appears low normal.  Intermediate risk study.    EKG 08/20/2020: Sinus rhythm 93 bpm  Nonspecific T-abnormality  Echocardiogram 07/31/2020:  Left ventricle cavity is normal in size. Mild concentric hypertrophy of  the left ventricle. Mild global hypokinesis. LVEF 45-50%. Doppler evidence  of grade I (impaired) diastolic dysfunction, normal LAP.  Left atrial cavity is moderately dilated.  Mild (Grade I) mitral regurgitation.  Mild tricuspid regurgitation.  No evidence of pulmonary hypertension.  No significant  change compared to previous study on 01/30/2020.  Abdominal Aortic Duplex 01/30/2020:  Normal abdominal aorta. No evidence of aneurysm.  No significant  atherosclerotic changes noted. No AAA.  Minimal increase in the left common iliac artery.   EKG 12/26/2019: Sinus rhythm 84 bpm.  Frequent PVCs.  Recent labs: 10/22/2020: Glucose 101, BUN/Cr 11/0.88. EGFR 73. Na/K 143/4.3.   09/12/2020: Chol 158, TG 97, HDL 54, LDL 86  12/26/2019: Glucose N/A, BUN/Cr 12/0.7. EGFR 81.  HbA1C 6.0% Chol 201, TG 92, HDL 48, LDL 136 TSH 2.3 normal  05/24/2019: Glucose 106, BUN/Cr 7/0.9. EGFR >60. Na/K 144/3.8.  H/H 11/35. MCV 93. Platelets 286 HbA1C 6.0% Chol 201, TG 92, HDL 48, LDL 136 TSH 2.3 normal   Review of Systems  Cardiovascular:  Positive for dyspnea on exertion. Negative for chest pain, leg swelling, palpitations and syncope.        Vitals:   02/16/21 1345  BP: (!) 119/59  Pulse: 90  Resp: 16  Temp: 98.5 F (36.9 C)  SpO2: 95%    Body mass index is 33.48 kg/m. Filed Weights   02/16/21 1345  Weight: 189 lb (85.7 kg)     Objective:   Physical Exam Vitals and nursing note reviewed.  Constitutional:      General: She is not in acute distress. Neck:     Vascular: No JVD.  Cardiovascular:     Rate and Rhythm: Normal rate and regular rhythm. FrequentExtrasystoles are present.    Heart sounds: Normal heart sounds. No murmur heard. Pulmonary:     Effort: Pulmonary effort is normal.     Breath sounds: Normal breath sounds. No wheezing or rales.  Musculoskeletal:     Right lower leg: No edema.     Left lower leg: No edema.         Assessment & Recommendations:   65 y.o. Caucasian female with hypertension, mixed hyperlipidemia, exertional dyspnea, abnormal stress test  Exertional dyspnea: HFmrEF No obstructive CAD Continue Entresto 24-26 mg bid, spironolactone 25 mg daily, metoprolol succinate-increased to 50 mg daily. I suspect she may have arrhythmia and is  cardiomyopathy with her EF of 45-50%. She continues to have relatively high PVC burden-11.8% on monitor in 11/2020. I will refer to EP for management of frequent PVCs and consideration for ablation versus antiarrhythmic therapy.  Mixed hyperlipidemia: Currently on Crestor 10 mg  F/u in 3 months    Nigel Mormon, MD Pager: [phone] Office: [phone]

## 2021-02-19 ENCOUNTER — Other Ambulatory Visit: Payer: Self-pay | Admitting: Cardiology

## 2021-02-19 DIAGNOSIS — R0609 Other forms of dyspnea: Secondary | ICD-10-CM

## 2021-02-20 ENCOUNTER — Ambulatory Visit: Payer: Medicare HMO | Admitting: Cardiology

## 2021-02-24 ENCOUNTER — Other Ambulatory Visit: Payer: Self-pay

## 2021-02-24 ENCOUNTER — Ambulatory Visit: Payer: Medicare HMO

## 2021-02-24 DIAGNOSIS — I5020 Unspecified systolic (congestive) heart failure: Secondary | ICD-10-CM | POA: Diagnosis not present

## 2021-02-24 DIAGNOSIS — I493 Ventricular premature depolarization: Secondary | ICD-10-CM

## 2021-02-24 DIAGNOSIS — R0609 Other forms of dyspnea: Secondary | ICD-10-CM

## 2021-02-24 MED ORDER — SPIRONOLACTONE 25 MG PO TABS
ORAL_TABLET | ORAL | 3 refills | Status: DC
Start: 2021-02-24 — End: 2021-06-22

## 2021-02-26 ENCOUNTER — Ambulatory Visit: Payer: Medicare HMO | Admitting: Internal Medicine

## 2021-02-26 ENCOUNTER — Other Ambulatory Visit: Payer: Self-pay

## 2021-02-26 ENCOUNTER — Encounter: Payer: Self-pay | Admitting: Internal Medicine

## 2021-02-26 VITALS — BP 130/78 | HR 78 | Ht 63.0 in | Wt 188.4 lb

## 2021-02-26 DIAGNOSIS — I493 Ventricular premature depolarization: Secondary | ICD-10-CM

## 2021-02-26 DIAGNOSIS — I5020 Unspecified systolic (congestive) heart failure: Secondary | ICD-10-CM | POA: Diagnosis not present

## 2021-02-26 MED ORDER — FLECAINIDE ACETATE 50 MG PO TABS: 50.0000 mg | ORAL_TABLET | Freq: Two times a day (BID) | ORAL | 11 refills | Status: DC

## 2021-02-26 NOTE — Progress Notes (Signed)
HPI Tammy Sandoval is referred by Dr. Virgina Jock for evaluation of PVC's. She is a pleasant 65 yo woman with asthma who was found to have mild LV dysfunction and very minimal CAD. She has palpitations and wore a cardiac monitor demonstrating 11% PVC's. The patient has been on a beta blocker and medication for HTN and mild LV dysfunction and most recent eval demonstated an EF of 50-55%. When she wore the cardiac monitor she had one dominant PVC and 2 infrequent PVC morphologies. On 12 lead ECG she has left bundle PVC's which transition to positive at lead V3.  Allergies  Allergen Reactions   Latex Swelling    Redness,swelling   Wellbutrin [Bupropion Hcl] Itching and Other (See Comments)    HEAD TINGLES INTOLERANCE: MADE PT. LOOPY     Current Outpatient Medications  Medication Sig Dispense Refill   acyclovir ointment (ZOVIRAX) 5 %      albuterol (VENTOLIN HFA) 108 (90 Base) MCG/ACT inhaler Inhale 2 puffs into the lungs every 6 (six) hours as needed for wheezing or shortness of breath. 8 g 6   ARIPiprazole (ABILIFY) 15 MG tablet Take 7.5 mg by mouth daily.   0   baclofen (LIORESAL) 10 MG tablet Take 1 tablet by mouth 3 (three) times daily as needed.     ENTRESTO 24-26 MG TAKE 1 TABLET BY MOUTH TWICE DAILY 60 tablet 1   fluticasone furoate-vilanterol (BREO ELLIPTA) 200-25 MCG/ACT AEPB Inhale 1 puff into the lungs daily. 60 each 3   gabapentin (NEURONTIN) 600 MG tablet Take 1,200-1,800 mg by mouth See admin instructions. Take 1200 mg by mouth in the morning and 1800 mg in the evening  0   hydrOXYzine (ATARAX/VISTARIL) 25 MG tablet Take 25 mg by mouth daily.     metoprolol succinate (TOPROL-XL) 50 MG 24 hr tablet Take 1 tablet (50 mg total) by mouth daily. Take with or immediately following a meal. 90 tablet 3   omeprazole (PRILOSEC) 40 MG capsule Take 1 capsule by mouth daily.     spironolactone (ALDACTONE) 25 MG tablet TAKE 1 TABLET(25 MG) BY MOUTH DAILY 90 tablet 3   trazodone  (DESYREL) 300 MG tablet Take 300 mg by mouth at bedtime.   0   venlafaxine XR (EFFEXOR-XR) 150 MG 24 hr capsule Take 1 capsule (150 mg total) by mouth daily. (Patient taking differently: Take 300 mg by mouth daily.)     rosuvastatin (CRESTOR) 10 MG tablet Take 1 tablet (10 mg total) by mouth daily. 90 tablet 3   No current facility-administered medications for this visit.     Past Medical History:  Diagnosis Date   Anemia    Anxiety    Arthritis    Cancer ([place])    skin-surgically removed   Crescendo transient ischemic attacks    pt has no idea about this, has never had TIA's or strokes   Depression    Dyspnea    with exertion   Generalized headaches    GERD (gastroesophageal reflux disease)    Insomnia    Irritable bowel syndrome    mostly constipation   Leg pain    Low back pain    Obesity    Palpitations    Pre-diabetes    hx of    Restless leg    Seizures ([place]) 2013   only one she has ever had, placed on neurontin   Thrombophlebitis    Ulcer    RLE   Varicose veins  ROS:   All systems reviewed and negative except as noted in the HPI.   Past Surgical History:  Procedure Laterality Date   APPENDECTOMY     BREAST LUMPECTOMY     bilateral   CERVICAL DISC ARTHROPLASTY N/A 08/22/2015   Procedure: Cervical five - six Cervical six - seven arthroplasty;  Surgeon: Ashok Pall, MD;  Location: [facility];  Service: Neurosurgery;  Laterality: N/A;  C56 C67 arthroplasty   CESAREAN SECTION     COLONOSCOPY     KNEE ARTHROPLASTY Right 02/23/2016   Procedure: COMPUTER ASSISTED TOTAL KNEE ARTHROPLASTY NAVIGATION;  Surgeon: Rod Can, MD;  Location: [facility];  Service: Orthopedics;  Laterality: Right;   KNEE ARTHROPLASTY Left 03/15/2018   Procedure: COMPUTER ASSISTED TOTAL KNEE ARTHROPLASTY;  Surgeon: Rod Can, MD;  Location: [facility];  Service: Orthopedics;  Laterality: Left;   LEG SURGERY  2004   mass removed on right leg   RIGHT/LEFT HEART CATH AND CORONARY  ANGIOGRAPHY N/A 09/16/2020   Procedure: RIGHT/LEFT HEART CATH AND CORONARY ANGIOGRAPHY;  Surgeon: Nigel Mormon, MD;  Location: [facility];  Service: Cardiovascular;  Laterality: N/A;   skin cancer removal  2012 & 2013   2 places on face     Family History  Problem Relation Age of Onset   Cancer Mother        pancreatic and breast   Deep vein thrombosis Mother    Diabetes Mother    Heart disease Mother    Other Mother        varicose veing   Heart disease Father    Hyperlipidemia Father    Hypertension Father    Heart attack Father    COPD Father    AAA (abdominal aortic aneurysm) Father    Diabetes Brother    Heart attack Brother    Heart disease Brother      Social History   Socioeconomic History   Marital status: Married    Spouse name: Not on file   Number of children: 1   Years of education: Not on file   Highest education level: Not on file  Occupational History   Not on file  Tobacco Use   Smoking status: Never   Smokeless tobacco: Never  Vaping Use   Vaping Use: Never used  Substance and Sexual Activity   Alcohol use: No   Drug use: No   Sexual activity: Yes  Other Topics Concern   Not on file  Social History Narrative   Not on file   Social Determinants of Health   Financial Resource Strain: Not on file  Food Insecurity: Not on file  Transportation Needs: Not on file  Physical Activity: Not on file  Stress: Not on file  Social Connections: Not on file  Intimate Partner Violence: Not on file     BP 130/78   Pulse 78   Ht 5' 3"  (1.6 m)   Wt 188 lb 6.4 oz (85.5 kg)   SpO2 98%   BMI 33.37 kg/m   Physical Exam:  Well appearing NAD HEENT: Unremarkable Neck:  No JVD, no thyromegally Lymphatics:  No adenopathy Back:  No CVA tenderness Lungs:  Clear with no wheezes HEART:  Regular rate rhythm, no murmurs, no rubs, no clicks Abd:  soft, positive bowel sounds, no organomegally, no rebound, no guarding Ext:  2 plus pulses,  no edema, no cyanosis, no clubbing Skin:  No rashes no nodules Neuro:  CN II through XII intact, motor grossly  intact  EKG - reviewed. NSR with frequent PVC's  Assess/Plan:  PVC's - I have reviewed the treatment options with the patient. He 11% PVC's is not enough to recommend ablation. We usually like to see over 20% before considering ablation. She is quite symptomatic and I have recommended a trial of flecainide 50 bid and I'll have her back for an nurse visit and 12 lead ECG.  HTN - her bp is reasonably well controlled. We will follow.  Carleene Overlie Elpidia Karn,MD

## 2021-02-26 NOTE — Patient Instructions (Addendum)
Medication Instructions:  Your physician has recommended you make the following change in your medication:  START Flecanide 50 mg 1 tablet by mouth twice a day.   Labwork: None ordered.  Testing/Procedures: None ordered.  Follow-Up:  You will be schedule for a nurse visit November 28th at [place] at the [facility] office. Your physician wants you to follow-up in: 3 to 4 months with Cristopher Peru, MD   Any Other Special Instructions Will Be Listed Below (If Applicable).  If you need a refill on your cardiac medications before your next appointment, please call your pharmacy.   Flecainide Tablets What is this medication? FLECAINIDE (FLEK a nide) prevents and treats a fast or irregular heartbeat (arrhythmia). It is often used to treat a type of arrhythmia known as AFib (atrial fibrillation). It works by slowing down overactive electric signals in the heart, which stabilizes your heart rhythm. It belongs to a group of medications called antiarrhythmics. This medicine may be used for other purposes; ask your health care provider or pharmacist if you have questions. COMMON BRAND NAME(S): Tambocor What should I tell my care team before I take this medication? They need to know if you have any of these conditions: Abnormal levels of potassium in the blood Heart disease including heart rhythm and heart rate problems Kidney or liver disease Recent heart attack An unusual or allergic reaction to flecainide, local anesthetics, other medications, foods, dyes, or preservatives Pregnant or trying to get pregnant Breast-feeding How should I use this medication? Take this medication by mouth with a glass of water. Follow the directions on the prescription label. You can take this medication with or without food. Take your doses at regular intervals. Do not take your medication more often than directed. Do not stop taking this medication suddenly. This may cause serious, heart-related side effects. If  your care team wants you to stop the medication, the dose may be slowly lowered over time to avoid any side effects. Talk to your care team regarding the use of this medication in children. While this medication may be prescribed for children as young as 1 year of age for selected conditions, precautions do apply. Overdosage: If you think you have taken too much of this medicine contact a poison control center or emergency room at once. NOTE: This medicine is only for you. Do not share this medicine with others. What if I miss a dose? If you miss a dose, take it as soon as you can. If it is almost time for your next dose, take only that dose. Do not take double or extra doses. What may interact with this medication? Do not take this medication with any of the following: Amoxapine Arsenic trioxide Certain antibiotics like clarithromycin, erythromycin, gatifloxacin, gemifloxacin, levofloxacin, moxifloxacin, sparfloxacin, or troleandomycin Certain antidepressants called tricyclic antidepressants like amitriptyline, imipramine, or nortriptyline Certain medications to control heart rhythm like disopyramide, encainide, moricizine, procainamide, propafenone, and quinidine Cisapride Delavirdine Droperidol Haloperidol [place] Imatinib Levomethadyl Maprotiline Medications for malaria like chloroquine and halofantrine Pentamidine Phenothiazines like chlorpromazine, mesoridazine, prochlorperazine, thioridazine Pimozide Quinine Ranolazine Ritonavir Sertindole This medication may also interact with the following: Cimetidine Dofetilide Medications for angina or high blood pressure Medications to control heart rhythm like amiodarone and digoxin Ziprasidone This list may not describe all possible interactions. Give your health care provider a list of all the medicines, herbs, non-prescription drugs, or dietary supplements you use. Also tell them if you smoke, drink alcohol, or use illegal drugs.  Some items may interact with  your medicine. What should I watch for while using this medication? Visit your care team for regular checks on your progress. Because your condition and the use of this medication carries some risk, it is a good idea to carry an identification card, necklace or bracelet with details of your condition, medications, and care team. Check your blood pressure and pulse rate regularly. Ask your care team what your blood pressure and pulse rate should be, and when you should contact them. Your care team also may schedule regular blood tests and electrocardiograms to check your progress. You may get drowsy or dizzy. Do not drive, use machinery, or do anything that needs mental alertness until you know how this medication affects you. Do not stand or sit up quickly, especially if you are an older patient. This reduces the risk of dizzy or fainting spells. Alcohol can make you more dizzy, increase flushing and rapid heartbeats. Avoid alcoholic drinks. What side effects may I notice from receiving this medication? Side effects that you should report to your care team as soon as possible: Allergic reactions--skin rash, itching, hives, swelling of the face, lips, tongue, or throat Heart failure--shortness of breath, swelling of the ankles, feet, or hands, sudden weight gain, unusual weakness or fatigue Heart rhythm changes--fast or irregular heartbeat, dizziness, feeling faint or lightheaded, chest pain, trouble breathing Liver injury--right upper belly pain, loss of appetite, nausea, light-colored stool, dark yellow or brown urine, yellowing skin or eyes, unusual weakness or fatigue Side effects that usually do not require medical attention (report to your care team if they continue or are bothersome): Blurry vision Constipation Dizziness Fatigue Headache Nausea Tremors or shaking This list may not describe all possible side effects. Call your doctor for medical advice about side  effects. You may report side effects to FDA at 1-800-FDA-1088. Where should I keep my medication? Keep out of the reach of children and pets. Store at room temperature between 15 and 30 degrees C (59 and 86 degrees F). Protect from light. Keep container tightly closed. Throw away any unused medication after the expiration date. NOTE: This sheet is a summary. It may not cover all possible information. If you have questions about this medicine, talk to your doctor, pharmacist, or health care provider.  2022 Elsevier/Gold Standard (2020-05-30 00:00:00)

## 2021-03-12 ENCOUNTER — Other Ambulatory Visit: Payer: Self-pay | Admitting: Pulmonary Disease

## 2021-03-12 DIAGNOSIS — J4530 Mild persistent asthma, uncomplicated: Secondary | ICD-10-CM

## 2021-03-16 ENCOUNTER — Other Ambulatory Visit: Payer: Self-pay

## 2021-03-16 ENCOUNTER — Ambulatory Visit: Payer: Medicare HMO

## 2021-03-16 VITALS — HR 74 | Ht 63.0 in

## 2021-03-16 DIAGNOSIS — I493 Ventricular premature depolarization: Secondary | ICD-10-CM

## 2021-03-17 NOTE — Progress Notes (Signed)
Reason for visit: new start flecainide   Name of MD requesting visit: Dr. Lovena Le  H&P: Pt with PVC's  ROS related to problem: Pt started flecainide 50 mg PO BID.  Since starting medications she states she feels well.  Has not felt any palpitations/PVC's since starting medication.  NO PVC's on EKG. Assessment and plan per MD: EKG reviewed and stable.  Continue current dose of flecainide and follow up as scheduled.

## 2021-03-17 NOTE — Patient Instructions (Signed)
Follow up as scheduled.  

## 2021-03-19 DIAGNOSIS — F331 Major depressive disorder, recurrent, moderate: Secondary | ICD-10-CM | POA: Diagnosis not present

## 2021-03-19 DIAGNOSIS — F411 Generalized anxiety disorder: Secondary | ICD-10-CM | POA: Diagnosis not present

## 2021-03-22 ENCOUNTER — Other Ambulatory Visit: Payer: Self-pay | Admitting: Cardiology

## 2021-03-22 DIAGNOSIS — I5032 Chronic diastolic (congestive) heart failure: Secondary | ICD-10-CM

## 2021-04-01 DIAGNOSIS — M7918 Myalgia, other site: Secondary | ICD-10-CM | POA: Diagnosis not present

## 2021-04-01 DIAGNOSIS — G8929 Other chronic pain: Secondary | ICD-10-CM | POA: Diagnosis not present

## 2021-04-01 DIAGNOSIS — G894 Chronic pain syndrome: Secondary | ICD-10-CM | POA: Diagnosis not present

## 2021-04-01 DIAGNOSIS — Z5181 Encounter for therapeutic drug level monitoring: Secondary | ICD-10-CM | POA: Diagnosis not present

## 2021-04-01 DIAGNOSIS — Z79891 Long term (current) use of opiate analgesic: Secondary | ICD-10-CM | POA: Diagnosis not present

## 2021-04-01 DIAGNOSIS — M47816 Spondylosis without myelopathy or radiculopathy, lumbar region: Secondary | ICD-10-CM | POA: Diagnosis not present

## 2021-04-14 DIAGNOSIS — K219 Gastro-esophageal reflux disease without esophagitis: Secondary | ICD-10-CM | POA: Diagnosis not present

## 2021-04-14 DIAGNOSIS — M7918 Myalgia, other site: Secondary | ICD-10-CM | POA: Diagnosis not present

## 2021-04-14 DIAGNOSIS — Z888 Allergy status to other drugs, medicaments and biological substances status: Secondary | ICD-10-CM | POA: Diagnosis not present

## 2021-04-14 DIAGNOSIS — G8929 Other chronic pain: Secondary | ICD-10-CM | POA: Diagnosis not present

## 2021-04-14 DIAGNOSIS — M47816 Spondylosis without myelopathy or radiculopathy, lumbar region: Secondary | ICD-10-CM | POA: Diagnosis not present

## 2021-04-14 DIAGNOSIS — Z79899 Other long term (current) drug therapy: Secondary | ICD-10-CM | POA: Diagnosis not present

## 2021-04-14 DIAGNOSIS — G2581 Restless legs syndrome: Secondary | ICD-10-CM | POA: Diagnosis not present

## 2021-04-14 DIAGNOSIS — F32A Depression, unspecified: Secondary | ICD-10-CM | POA: Diagnosis not present

## 2021-04-14 DIAGNOSIS — M810 Age-related osteoporosis without current pathological fracture: Secondary | ICD-10-CM | POA: Diagnosis not present

## 2021-04-21 DIAGNOSIS — G629 Polyneuropathy, unspecified: Secondary | ICD-10-CM | POA: Diagnosis not present

## 2021-04-21 DIAGNOSIS — M542 Cervicalgia: Secondary | ICD-10-CM | POA: Diagnosis not present

## 2021-04-21 DIAGNOSIS — B372 Candidiasis of skin and nail: Secondary | ICD-10-CM | POA: Diagnosis not present

## 2021-04-21 DIAGNOSIS — E782 Mixed hyperlipidemia: Secondary | ICD-10-CM | POA: Diagnosis not present

## 2021-04-21 DIAGNOSIS — E669 Obesity, unspecified: Secondary | ICD-10-CM | POA: Diagnosis not present

## 2021-04-21 DIAGNOSIS — L03116 Cellulitis of left lower limb: Secondary | ICD-10-CM | POA: Diagnosis not present

## 2021-04-21 DIAGNOSIS — F418 Other specified anxiety disorders: Secondary | ICD-10-CM | POA: Diagnosis not present

## 2021-04-21 DIAGNOSIS — N3000 Acute cystitis without hematuria: Secondary | ICD-10-CM | POA: Diagnosis not present

## 2021-04-21 DIAGNOSIS — R7303 Prediabetes: Secondary | ICD-10-CM | POA: Diagnosis not present

## 2021-04-29 ENCOUNTER — Ambulatory Visit: Payer: Medicare HMO | Admitting: Pulmonary Disease

## 2021-05-05 DIAGNOSIS — G629 Polyneuropathy, unspecified: Secondary | ICD-10-CM | POA: Diagnosis not present

## 2021-05-05 DIAGNOSIS — E782 Mixed hyperlipidemia: Secondary | ICD-10-CM | POA: Diagnosis not present

## 2021-05-05 DIAGNOSIS — F418 Other specified anxiety disorders: Secondary | ICD-10-CM | POA: Diagnosis not present

## 2021-05-05 DIAGNOSIS — Z0000 Encounter for general adult medical examination without abnormal findings: Secondary | ICD-10-CM | POA: Diagnosis not present

## 2021-05-05 DIAGNOSIS — K589 Irritable bowel syndrome without diarrhea: Secondary | ICD-10-CM | POA: Diagnosis not present

## 2021-05-05 DIAGNOSIS — D509 Iron deficiency anemia, unspecified: Secondary | ICD-10-CM | POA: Diagnosis not present

## 2021-05-05 DIAGNOSIS — E669 Obesity, unspecified: Secondary | ICD-10-CM | POA: Diagnosis not present

## 2021-05-05 DIAGNOSIS — M542 Cervicalgia: Secondary | ICD-10-CM | POA: Diagnosis not present

## 2021-05-05 DIAGNOSIS — K219 Gastro-esophageal reflux disease without esophagitis: Secondary | ICD-10-CM | POA: Diagnosis not present

## 2021-05-05 DIAGNOSIS — R7303 Prediabetes: Secondary | ICD-10-CM | POA: Diagnosis not present

## 2021-05-18 ENCOUNTER — Other Ambulatory Visit: Payer: Self-pay | Admitting: Cardiology

## 2021-05-18 DIAGNOSIS — I5032 Chronic diastolic (congestive) heart failure: Secondary | ICD-10-CM

## 2021-05-20 ENCOUNTER — Encounter: Payer: Self-pay | Admitting: Cardiology

## 2021-05-20 ENCOUNTER — Ambulatory Visit: Payer: Medicare HMO | Admitting: Cardiology

## 2021-05-20 ENCOUNTER — Other Ambulatory Visit: Payer: Self-pay

## 2021-05-20 VITALS — BP 108/59 | HR 77 | Temp 98.0°F | Resp 16 | Ht 63.0 in | Wt 193.0 lb

## 2021-05-20 DIAGNOSIS — I5020 Unspecified systolic (congestive) heart failure: Secondary | ICD-10-CM | POA: Diagnosis not present

## 2021-05-20 DIAGNOSIS — E782 Mixed hyperlipidemia: Secondary | ICD-10-CM

## 2021-05-20 DIAGNOSIS — I493 Ventricular premature depolarization: Secondary | ICD-10-CM | POA: Diagnosis not present

## 2021-05-20 NOTE — Progress Notes (Addendum)
Patient referred by [name], MD for palpitations  Subjective:   Tammy Sandoval, female    DOB: 08-01-1955, 66 y.o.   MRN: [id number]   Chief Complaint  Patient presents with   PVC   HFrEF   Follow-up    3 month   HPI  66 y.o. Caucasian female with hypertension, mixed hyperlipidemia, HFmrEF, frequent PVCs   Patient is doing well.  Symptoms of palpitations and shortness of breath have significantly improved after starting flecainide for PVCs.  Current Outpatient Medications on File Prior to Visit  Medication Sig Dispense Refill   acyclovir ointment (ZOVIRAX) 5 %      albuterol (VENTOLIN HFA) 108 (90 Base) MCG/ACT inhaler Inhale 2 puffs into the lungs every 6 (six) hours as needed for wheezing or shortness of breath. 8 g 6   ARIPiprazole (ABILIFY) 15 MG tablet Take 7.5 mg by mouth daily.   0   baclofen (LIORESAL) 10 MG tablet Take 1 tablet by mouth 3 (three) times daily as needed.     ENTRESTO 24-26 MG TAKE 1 TABLET BY MOUTH TWICE DAILY 60 tablet 1   flecainide (TAMBOCOR) 50 MG tablet Take 1 tablet (50 mg total) by mouth 2 (two) times daily. 60 tablet 11   fluticasone furoate-vilanterol (BREO ELLIPTA) 200-25 MCG/ACT AEPB Inhale 1 puff into the lungs daily. 60 each 3   gabapentin (NEURONTIN) 600 MG tablet Take 1,200-1,800 mg by mouth See admin instructions. Take 1200 mg by mouth in the morning and 1800 mg in the evening  0   hydrOXYzine (ATARAX/VISTARIL) 25 MG tablet Take 25 mg by mouth daily.     metoprolol succinate (TOPROL-XL) 50 MG 24 hr tablet Take 1 tablet (50 mg total) by mouth daily. Take with or immediately following a meal. 90 tablet 3   omeprazole (PRILOSEC) 40 MG capsule Take 1 capsule by mouth daily.     rosuvastatin (CRESTOR) 10 MG tablet Take 1 tablet (10 mg total) by mouth daily. 90 tablet 3   spironolactone (ALDACTONE) 25 MG tablet TAKE 1 TABLET(25 MG) BY MOUTH DAILY 90 tablet 3   trazodone (DESYREL) 300 MG tablet Take 300 mg by mouth at bedtime.   0    venlafaxine XR (EFFEXOR-XR) 150 MG 24 hr capsule Take 1 capsule (150 mg total) by mouth daily. (Patient taking differently: Take 300 mg by mouth daily.)     No current facility-administered medications on file prior to visit.    Cardiovascular and other pertinent studies:  EKG 02/16/2021: Sinus rhythm 88 bpm Ventricular trigeminy  Mobile cardiac telemetry 4 days 11/13/2020 - 11/17/2020: Dominant rhythm: Sinus. HR 62-133 bpm. Avg HR 91 bpm, in sinus rhythm. 1 episodes of SVT, at 154 bpm for 5 beats. <1% isolated SVE, couplet, no triplets. 0 episodes of VT. 11.5% isolated VE-including ventricular bigeminy and trigeminy, <1 couplet/triplets. No atrial fibrillation/atrial flutter/VT/high grade AV block, sinus pause >3sec noted. 0 patient triggered events.     RHC/LHC 09/16/2020: LM: Essentially nonexistent with dual ostia for LAD and LCx LAD: Normal LCx: Mid focal 40% stenosis RCA: Normal   Normal filling pressures   Single vessel mod CAD without pulmonary hypertension Unlikely to be the cause for her exertional dyspnea Recommend medial management Seek alternate cause for exertional dyspnea  Lexiscan Tetrofosmin stress test 09/01/2020: Lexiscan nuclear stress test performed using 1-day protocol.  SPECT images show apical thinning, likely physiological, as well as small sized, mild intensity, partially reversible perfusion defect in basal anteroseptal myocardium. Stress LVEF calculated  33%, although visually appears low normal.  Intermediate risk study.    Echocardiogram 07/31/2020:  Left ventricle cavity is normal in size. Mild concentric hypertrophy of  the left ventricle. Mild global hypokinesis. LVEF 45-50%. Doppler evidence  of grade I (impaired) diastolic dysfunction, normal LAP.  Left atrial cavity is moderately dilated.  Mild (Grade I) mitral regurgitation.  Mild tricuspid regurgitation.  No evidence of pulmonary hypertension.  No significant change compared to previous  study on 01/30/2020.  Abdominal Aortic Duplex 01/30/2020:  Normal abdominal aorta. No evidence of aneurysm.  No significant  atherosclerotic changes noted. No AAA.  Minimal increase in the left common iliac artery.   EKG 12/26/2019: Sinus rhythm 84 bpm.  Frequent PVCs.  Recent labs: 10/22/2020: Glucose 101, BUN/Cr 11/0.88. EGFR 73. Na/K 143/4.3.   09/12/2020: Chol 158, TG 97, HDL 54, LDL 86  12/26/2019: Glucose N/A, BUN/Cr 12/0.7. EGFR 81.  HbA1C 6.0% Chol 201, TG 92, HDL 48, LDL 136 TSH 2.3 normal  05/24/2019: Glucose 106, BUN/Cr 7/0.9. EGFR >60. Na/K 144/3.8.  H/H 11/35. MCV 93. Platelets 286 HbA1C 6.0% Chol 201, TG 92, HDL 48, LDL 136 TSH 2.3 normal   Review of Systems  Cardiovascular:  Negative for chest pain, dyspnea on exertion, leg swelling, palpitations and syncope.        Vitals:   05/20/21 1049 05/20/21 1052  BP: (!) 100/57 (!) 108/59  Pulse: 78 77  Resp: 16   Temp: 98 F (36.7 C)   SpO2: 96%     Body mass index is 34.19 kg/m. Filed Weights   05/20/21 1049  Weight: 193 lb (87.5 kg)     Objective:   Physical Exam Vitals and nursing note reviewed.  Constitutional:      General: She is not in acute distress. Neck:     Vascular: No JVD.  Cardiovascular:     Rate and Rhythm: Normal rate and regular rhythm. FrequentExtrasystoles are present.    Heart sounds: Normal heart sounds. No murmur heard. Pulmonary:     Effort: Pulmonary effort is normal.     Breath sounds: Normal breath sounds. No wheezing or rales.  Musculoskeletal:     Right lower leg: No edema.     Left lower leg: No edema.         Assessment & Recommendations:    66 y.o. Caucasian female with hypertension, mixed hyperlipidemia, HFmrEF, frequent PVCs   Frequent PVCs: Likely etiology for her HFmrEF and nonischemic cardiomyopathy. Continue flecainide, and metoprolol succinate. Given her overall improvement in symptoms with treatment of PVC, I suspect her EF will improve. Check  echocardiogram now.  Okay to stop Entresto 24-26 mg twice daily. Continue spironolactone 25 mg daily for now.  I am optimistic that we can stop this in the future as well.  Mixed hyperlipidemia: Fairly well controlled on Crestor 10 mg  F/u in 3 months    Nigel Mormon, MD Pager: [phone] Office: [phone]

## 2021-06-19 DIAGNOSIS — G629 Polyneuropathy, unspecified: Secondary | ICD-10-CM | POA: Diagnosis not present

## 2021-06-22 ENCOUNTER — Other Ambulatory Visit: Payer: Self-pay | Admitting: Cardiology

## 2021-06-22 DIAGNOSIS — R0609 Other forms of dyspnea: Secondary | ICD-10-CM

## 2021-06-30 DIAGNOSIS — L0390 Cellulitis, unspecified: Secondary | ICD-10-CM | POA: Diagnosis not present

## 2021-06-30 DIAGNOSIS — L309 Dermatitis, unspecified: Secondary | ICD-10-CM | POA: Diagnosis not present

## 2021-07-02 DIAGNOSIS — F411 Generalized anxiety disorder: Secondary | ICD-10-CM | POA: Diagnosis not present

## 2021-07-02 DIAGNOSIS — F331 Major depressive disorder, recurrent, moderate: Secondary | ICD-10-CM | POA: Diagnosis not present

## 2021-07-10 ENCOUNTER — Encounter: Payer: Self-pay | Admitting: Internal Medicine

## 2021-07-10 ENCOUNTER — Ambulatory Visit: Payer: Medicare HMO | Admitting: Internal Medicine

## 2021-07-10 ENCOUNTER — Other Ambulatory Visit: Payer: Self-pay

## 2021-07-10 VITALS — BP 112/68 | HR 81 | Ht 63.0 in | Wt 190.0 lb

## 2021-07-10 DIAGNOSIS — I493 Ventricular premature depolarization: Secondary | ICD-10-CM | POA: Diagnosis not present

## 2021-07-10 MED ORDER — METOPROLOL SUCCINATE ER 50 MG PO TB24: 50.0000 mg | ORAL_TABLET | Freq: Every day | ORAL | 3 refills | Status: DC

## 2021-07-10 MED ORDER — FLECAINIDE ACETATE 50 MG PO TABS: 50.0000 mg | ORAL_TABLET | Freq: Two times a day (BID) | ORAL | 3 refills | Status: DC

## 2021-07-10 NOTE — Patient Instructions (Signed)
Medication Instructions:  ?Your physician recommends that you continue on your current medications as directed. Please refer to the Current Medication list given to you today. ? ?Labwork: ?None ordered. ? ?Testing/Procedures: ?None ordered. ? ?Follow-Up: ?Your physician wants you to follow-up in: one year with Cristopher Peru, MD or one of the following Advanced Practice Providers on your designated Care Team:   ?Tommye Standard, PA-C ?Legrand Como "Jonni Sanger" [place], PA-C ? ? ?Any Other Special Instructions Will Be Listed Below (If Applicable). ? ?If you need a refill on your cardiac medications before your next appointment, please call your pharmacy.  ? ? ? ? ?

## 2021-07-10 NOTE — Progress Notes (Signed)
? ? ? ? ?HPI ?Ms. Rech returns today for followup of symptomatic PVC's. She is a pleasant 66 yo woman with preserved LV function, minimal CAD and 11% PVC's on her heart monitor. She was noted to have at least 2 PVC morphologies and I recommended initiation of flecainide. She has improved. She notes that her palpitations are much better.  ?Allergies  ?Allergen Reactions  ? Latex Swelling  ?  Redness,swelling  ? Wellbutrin [Bupropion Hcl] Itching and Other (See Comments)  ?  HEAD TINGLES ?INTOLERANCE: MADE PT. LOOPY  ? ? ? ?Current Outpatient Medications  ?Medication Sig Dispense Refill  ? acyclovir ointment (ZOVIRAX) 5 %     ? albuterol (VENTOLIN HFA) 108 (90 Base) MCG/ACT inhaler Inhale 2 puffs into the lungs every 6 (six) hours as needed for wheezing or shortness of breath. 8 g 6  ? ARIPiprazole (ABILIFY) 15 MG tablet Take 7.5 mg by mouth daily.   0  ? flecainide (TAMBOCOR) 50 MG tablet Take 1 tablet (50 mg total) by mouth 2 (two) times daily. 180 tablet 3  ? fluticasone furoate-vilanterol (BREO ELLIPTA) 200-25 MCG/ACT AEPB Inhale 1 puff into the lungs daily. 60 each 3  ? gabapentin (NEURONTIN) 600 MG tablet Take 1,200-1,800 mg by mouth See admin instructions. Take 1200 mg by mouth in the morning and 1800 mg in the evening  0  ? hydrOXYzine (ATARAX/VISTARIL) 25 MG tablet Take 25 mg by mouth daily.    ? metoprolol succinate (TOPROL-XL) 50 MG 24 hr tablet Take 1 tablet (50 mg total) by mouth daily. Take with or immediately following a meal. 90 tablet 3  ? omeprazole (PRILOSEC) 40 MG capsule Take 1 capsule by mouth daily.    ? rosuvastatin (CRESTOR) 10 MG tablet Take 1 tablet (10 mg total) by mouth daily. 90 tablet 3  ? spironolactone (ALDACTONE) 25 MG tablet TAKE 1 TABLET(25 MG) BY MOUTH DAILY 90 tablet 0  ? trazodone (DESYREL) 300 MG tablet Take 300 mg by mouth at bedtime.   0  ? venlafaxine XR (EFFEXOR-XR) 150 MG 24 hr capsule Take 1 capsule (150 mg total) by mouth daily. (Patient taking differently: Take 300  mg by mouth daily.)    ? ?No current facility-administered medications for this visit.  ? ? ? ?Past Medical History:  ?Diagnosis Date  ? Anemia   ? Anxiety   ? Arthritis   ? Cancer [facility])   ? skin-surgically removed  ? [place] transient ischemic attacks   ? pt has no idea about this, has never had TIA's or strokes  ? Depression   ? Dyspnea   ? with exertion  ? Generalized headaches   ? GERD (gastroesophageal reflux disease)   ? Insomnia   ? Irritable bowel syndrome   ? mostly constipation  ? Leg pain   ? Low back pain   ? Obesity   ? Palpitations   ? Pre-diabetes   ? hx of   ? Restless leg   ? Seizures ([place]) 2013  ? only one she has ever had, placed on neurontin  ? Thrombophlebitis   ? Ulcer   ? RLE  ? Varicose veins   ? ? ?ROS: ? ? All systems reviewed and negative except as noted in the HPI. ? ? ?Past Surgical History:  ?Procedure Laterality Date  ? APPENDECTOMY    ? BREAST LUMPECTOMY    ? bilateral  ? CERVICAL DISC ARTHROPLASTY N/A 08/22/2015  ? Procedure: Cervical five - six Cervical six - seven arthroplasty;  Surgeon: Ashok Pall, MD;  Location: [facility];  Service: Neurosurgery;  Laterality: N/A;  C56 C67 arthroplasty  ? CESAREAN SECTION    ? COLONOSCOPY    ? KNEE ARTHROPLASTY Right 02/23/2016  ? Procedure: COMPUTER ASSISTED TOTAL KNEE ARTHROPLASTY NAVIGATION;  Surgeon: Rod Can, MD;  Location: [facility];  Service: Orthopedics;  Laterality: Right;  ? KNEE ARTHROPLASTY Left 03/15/2018  ? Procedure: COMPUTER ASSISTED TOTAL KNEE ARTHROPLASTY;  Surgeon: Rod Can, MD;  Location: [facility];  Service: Orthopedics;  Laterality: Left;  ? LEG SURGERY  2004  ? mass removed on right leg  ? RIGHT/LEFT HEART CATH AND CORONARY ANGIOGRAPHY N/A 09/16/2020  ? Procedure: RIGHT/LEFT HEART CATH AND CORONARY ANGIOGRAPHY;  Surgeon: Nigel Mormon, MD;  Location: [facility];  Service: Cardiovascular;  Laterality: N/A;  ? skin cancer removal  2012 & 2013  ? 2 places on face  ? ? ? ?Family History  ?Problem  Relation Age of Onset  ? Cancer Mother   ?     pancreatic and breast  ? Deep vein thrombosis Mother   ? Diabetes Mother   ? Heart disease Mother   ? Other Mother   ?     varicose veing  ? Heart disease Father   ? Hyperlipidemia Father   ? Hypertension Father   ? Heart attack Father   ? COPD Father   ? AAA (abdominal aortic aneurysm) Father   ? Diabetes Brother   ? Heart attack Brother   ? Heart disease Brother   ? ? ? ?Social History  ? ?Socioeconomic History  ? Marital status: Married  ?  Spouse name: Not on file  ? Number of children: 1  ? Years of education: Not on file  ? Highest education level: Not on file  ?Occupational History  ? Not on file  ?Tobacco Use  ? Smoking status: Never  ? Smokeless tobacco: Never  ?Vaping Use  ? Vaping Use: Never used  ?Substance and Sexual Activity  ? Alcohol use: No  ? Drug use: No  ? Sexual activity: Yes  ?Other Topics Concern  ? Not on file  ?Social History Narrative  ? Not on file  ? ?Social Determinants of Health  ? ?Financial Resource Strain: Not on file  ?Food Insecurity: Not on file  ?Transportation Needs: Not on file  ?Physical Activity: Not on file  ?Stress: Not on file  ?Social Connections: Not on file  ?Intimate Partner Violence: Not on file  ? ? ? ?BP 112/68   Pulse 81   Ht 5' 3"  (1.6 m)   Wt 190 lb (86.2 kg)   SpO2 98%   BMI 33.66 kg/m?  ? ?Physical Exam: ? ?Well appearing NAD ?HEENT: Unremarkable ?Neck:  No JVD, no thyromegally ?Lymphatics:  No adenopathy ?Back:  No CVA tenderness ?Lungs:  Clear with no wheezes ?HEART:  Regular rate rhythm, no murmurs, no rubs, no clicks ?Abd:  soft, positive bowel sounds, no organomegally, no rebound, no guarding ?Ext:  2 plus pulses, no edema, no cyanosis, no clubbing ?Skin:  No rashes no nodules ?Neuro:  CN II through XII intact, motor grossly intact ? ?EKG - NSR ? ?Assess/Plan:  ?Symptomatic PVC's - She is doing well on flecainide 50 mg twice daily. She will continue. If needed we could uptitrate if her palpitations  return. I encouraged her to avoid caffeine and ETOH. ?HTN - her bp is well controlled.  ? ?Carleene Overlie Aleck Locklin,MD ?

## 2021-07-16 DIAGNOSIS — I872 Venous insufficiency (chronic) (peripheral): Secondary | ICD-10-CM | POA: Diagnosis not present

## 2021-07-16 DIAGNOSIS — L821 Other seborrheic keratosis: Secondary | ICD-10-CM | POA: Diagnosis not present

## 2021-08-03 ENCOUNTER — Ambulatory Visit: Payer: Medicare HMO

## 2021-08-03 DIAGNOSIS — I493 Ventricular premature depolarization: Secondary | ICD-10-CM

## 2021-08-03 DIAGNOSIS — I5032 Chronic diastolic (congestive) heart failure: Secondary | ICD-10-CM | POA: Diagnosis not present

## 2021-08-06 DIAGNOSIS — K589 Irritable bowel syndrome without diarrhea: Secondary | ICD-10-CM | POA: Diagnosis not present

## 2021-08-06 DIAGNOSIS — E669 Obesity, unspecified: Secondary | ICD-10-CM | POA: Diagnosis not present

## 2021-08-06 DIAGNOSIS — G629 Polyneuropathy, unspecified: Secondary | ICD-10-CM | POA: Diagnosis not present

## 2021-08-06 DIAGNOSIS — R7303 Prediabetes: Secondary | ICD-10-CM | POA: Diagnosis not present

## 2021-08-06 DIAGNOSIS — R251 Tremor, unspecified: Secondary | ICD-10-CM | POA: Diagnosis not present

## 2021-08-06 DIAGNOSIS — D519 Vitamin B12 deficiency anemia, unspecified: Secondary | ICD-10-CM | POA: Diagnosis not present

## 2021-08-06 DIAGNOSIS — E782 Mixed hyperlipidemia: Secondary | ICD-10-CM | POA: Diagnosis not present

## 2021-08-06 DIAGNOSIS — F418 Other specified anxiety disorders: Secondary | ICD-10-CM | POA: Diagnosis not present

## 2021-08-06 DIAGNOSIS — M542 Cervicalgia: Secondary | ICD-10-CM | POA: Diagnosis not present

## 2021-08-06 DIAGNOSIS — D509 Iron deficiency anemia, unspecified: Secondary | ICD-10-CM | POA: Diagnosis not present

## 2021-08-12 DIAGNOSIS — R251 Tremor, unspecified: Secondary | ICD-10-CM | POA: Diagnosis not present

## 2021-08-12 DIAGNOSIS — G629 Polyneuropathy, unspecified: Secondary | ICD-10-CM | POA: Diagnosis not present

## 2021-08-17 ENCOUNTER — Encounter: Payer: Self-pay | Admitting: Cardiology

## 2021-08-17 ENCOUNTER — Ambulatory Visit: Payer: Medicare HMO | Admitting: Cardiology

## 2021-08-17 VITALS — BP 116/72 | HR 80 | Temp 97.7°F | Resp 16 | Ht 63.0 in | Wt 195.0 lb

## 2021-08-17 DIAGNOSIS — I493 Ventricular premature depolarization: Secondary | ICD-10-CM

## 2021-08-17 DIAGNOSIS — I5020 Unspecified systolic (congestive) heart failure: Secondary | ICD-10-CM | POA: Diagnosis not present

## 2021-08-17 MED ORDER — FLECAINIDE ACETATE 50 MG PO TABS: 50.0000 mg | ORAL_TABLET | Freq: Two times a day (BID) | ORAL | 3 refills | Status: DC

## 2021-08-17 MED ORDER — METOPROLOL SUCCINATE ER 50 MG PO TB24: 50.0000 mg | ORAL_TABLET | Freq: Every day | ORAL | 3 refills | Status: DC

## 2021-08-17 NOTE — Progress Notes (Signed)
? ? ?Patient referred by [name], MD for palpitations ? ?Subjective:  ? ?Tammy Sandoval, female    DOB: 05/04/1955, 66 y.o.   MRN: [id number] ? ? ?Chief Complaint  ?Patient presents with  ? PVCS  ? Follow-up  ?  66 month  ? ?HPI ? ?66 y.o. Caucasian female with hypertension, mixed hyperlipidemia, HFmrEF, frequent PVCs  ? ?Patient is doing well.  Symptoms of palpitations have resolved., but dyspnea on exertion persists.  ? ?Reviewed recent test results with the patient, details below.  ? ? ?Current Outpatient Medications on File Prior to Visit  ?Medication Sig Dispense Refill  ? acyclovir ointment (ZOVIRAX) 5 %     ? albuterol (VENTOLIN HFA) 108 (90 Base) MCG/ACT inhaler Inhale 2 puffs into the lungs every 6 (six) hours as needed for wheezing or shortness of breath. 8 g 6  ? ARIPiprazole (ABILIFY) 15 MG tablet Take 7.5 mg by mouth daily.   0  ? flecainide (TAMBOCOR) 50 MG tablet Take 1 tablet (50 mg total) by mouth 2 (two) times daily. 180 tablet 3  ? fluticasone furoate-vilanterol (BREO ELLIPTA) 200-25 MCG/ACT AEPB Inhale 1 puff into the lungs daily. 60 each 3  ? gabapentin (NEURONTIN) 600 MG tablet Take 1,200-1,800 mg by mouth See admin instructions. Take 1200 mg by mouth in the morning and 1800 mg in the evening  0  ? hydrOXYzine (ATARAX/VISTARIL) 25 MG tablet Take 25 mg by mouth daily.    ? metoprolol succinate (TOPROL-XL) 50 MG 24 hr tablet Take 1 tablet (50 mg total) by mouth daily. Take with or immediately following a meal. 90 tablet 3  ? omeprazole (PRILOSEC) 40 MG capsule Take 1 capsule by mouth daily.    ? spironolactone (ALDACTONE) 25 MG tablet TAKE 1 TABLET(25 MG) BY MOUTH DAILY 90 tablet 0  ? trazodone (DESYREL) 300 MG tablet Take 300 mg by mouth at bedtime.   0  ? venlafaxine XR (EFFEXOR-XR) 150 MG 24 hr capsule Take 1 capsule (150 mg total) by mouth daily. (Patient taking differently: Take 300 mg by mouth daily.)    ? gabapentin (NEURONTIN) 600 MG tablet     ? ?No current  facility-administered medications on file prior to visit.  ? ? ?Cardiovascular and other pertinent studies: ? ?Echocardiogram 08/03/2021:  ?Normal LV systolic function with visual EF 55-60%. Left ventricle cavity  ?is normal in size. Normal left ventricular wall thickness. Normal global  ?wall motion. Normal diastolic filling pattern, normal LAP.  ?Mild tricuspid regurgitation. No evidence of pulmonary hypertension.  ?Compared to 07/31/2020 LVEF improved from 45-50% to 55-60%, G1DD and  ?moderate LAE are now normal.  ? ?EKG 02/16/2021: ?Sinus rhythm 88 bpm ?Ventricular trigeminy ? ?Mobile cardiac telemetry 4 days 11/13/2020 - 11/17/2020: ?Dominant rhythm: Sinus. ?HR 62-133 bpm. Avg HR 91 bpm, in sinus rhythm. ?1 episodes of SVT, at 154 bpm for 5 beats. ?<1% isolated SVE, couplet, no triplets. ?0 episodes of VT. ?11.5% isolated VE-including ventricular bigeminy and trigeminy, <1 couplet/triplets. ?No atrial fibrillation/atrial flutter/VT/high grade AV block, sinus pause >3sec noted. ?0 patient triggered events.  ?  ? ?RHC/LHC 09/16/2020: ?LM: Essentially nonexistent with dual ostia for LAD and LCx ?LAD: Normal ?LCx: Mid focal 40% stenosis ?RCA: Normal ?  ?Normal filling pressures ?  ?Single vessel mod CAD without pulmonary hypertension ?Unlikely to be the cause for her exertional dyspnea ?Recommend medial management ?Seek alternate cause for exertional dyspnea ? ?Lexiscan Tetrofosmin stress test 09/01/2020: ?Lexiscan nuclear stress test performed using 1-day protocol.  ?  SPECT images show apical thinning, likely physiological, as well as small sized, mild intensity, partially reversible perfusion defect in basal anteroseptal myocardium. Stress LVEF calculated 33%, although visually appears low normal.  ?Intermediate risk study.   ? ?Abdominal Aortic Duplex 01/30/2020:  ?Normal abdominal aorta. No evidence of aneurysm.  No significant  ?atherosclerotic changes noted. No AAA.  ?Minimal increase in the left common iliac  artery.  ? ?EKG 12/26/2019: ?Sinus rhythm 84 bpm.  Frequent PVCs. ? ?Recent labs: ?10/22/2020: ?Glucose 101, BUN/Cr 11/0.88. EGFR 73. Na/K 143/4.3.  ? ?09/12/2020: ?Chol 158, TG 97, HDL 54, LDL 86 ? ?12/26/2019: ?Glucose N/A, BUN/Cr 12/0.7. EGFR 81.  ?HbA1C 6.0% ?Chol 201, TG 92, HDL 48, LDL 136 ?TSH 2.3 normal ? ?05/24/2019: ?Glucose 106, BUN/Cr 7/0.9. EGFR >60. Na/K 144/3.8.  ?H/H 11/35. MCV 93. Platelets 286 ?HbA1C 6.0% ?Chol 201, TG 92, HDL 48, LDL 136 ?TSH 2.3 normal ? ? ?Review of Systems  ?Cardiovascular:  Negative for chest pain, dyspnea on exertion, leg swelling, palpitations and syncope.  ? ?   ? ? ?Vitals:  ? 08/17/21 1124  ?BP: 116/72  ?Pulse: 80  ?Resp: 16  ?Temp: 97.7 ?F (36.5 ?C)  ?SpO2: 97%  ? ? ?Body mass index is 34.54 kg/m?. ?Filed Weights  ? 08/17/21 1124  ?Weight: 195 lb (88.5 kg)  ? ? ? ?Objective:  ? Physical Exam ?Vitals and nursing note reviewed.  ?Constitutional:   ?   General: She is not in acute distress. ?Neck:  ?   Vascular: No JVD.  ?Cardiovascular:  ?   Rate and Rhythm: Normal rate and regular rhythm. FrequentExtrasystoles are present. ?   Heart sounds: Normal heart sounds. No murmur heard. ?Pulmonary:  ?   Effort: Pulmonary effort is normal.  ?   Breath sounds: Normal breath sounds. No wheezing or rales.  ?Musculoskeletal:  ?   Right lower leg: No edema.  ?   Left lower leg: No edema.  ? ? ?  ICD-10-CM   ?1. PVC (premature ventricular contraction)  I49.3 flecainide (TAMBOCOR) 50 MG tablet  ?  metoprolol succinate (TOPROL-XL) 50 MG 24 hr tablet  ?  ? ? ?   ? ?Assessment & Recommendations:  ? ? ?66 y.o. Caucasian female with hypertension, mixed hyperlipidemia, HFmrEF, frequent PVCs  ? ?Frequent PVCs: ?Likely etiology for her HFmrEF and nonischemic cardiomyopathy. EF now normalized.  ?Continue flecainide, and metoprolol succinate. Refilled today.  ?Continue spironolactone 25 mg daily for now.  It is helping hypertension. ? ?Her dyspnea may be out of proportion to her cardiac findings.  Deconditioning likely.  ? ?Mixed hyperlipidemia: ?Fairly well controlled on Crestor 10 mg ? ?F/u in 6 months ? ? ? ?Nigel Mormon, MD ?Pager: [phone] ?Office: [phone] ?

## 2021-08-20 DIAGNOSIS — R251 Tremor, unspecified: Secondary | ICD-10-CM | POA: Diagnosis not present

## 2021-09-07 DIAGNOSIS — R251 Tremor, unspecified: Secondary | ICD-10-CM | POA: Diagnosis not present

## 2021-09-07 DIAGNOSIS — R202 Paresthesia of skin: Secondary | ICD-10-CM | POA: Diagnosis not present

## 2021-09-07 DIAGNOSIS — M722 Plantar fascial fibromatosis: Secondary | ICD-10-CM | POA: Diagnosis not present

## 2021-09-07 DIAGNOSIS — G629 Polyneuropathy, unspecified: Secondary | ICD-10-CM | POA: Diagnosis not present

## 2021-09-18 DIAGNOSIS — M25372 Other instability, left ankle: Secondary | ICD-10-CM | POA: Diagnosis not present

## 2021-09-18 DIAGNOSIS — M722 Plantar fascial fibromatosis: Secondary | ICD-10-CM | POA: Diagnosis not present

## 2021-09-18 DIAGNOSIS — G629 Polyneuropathy, unspecified: Secondary | ICD-10-CM | POA: Diagnosis not present

## 2021-09-18 DIAGNOSIS — M792 Neuralgia and neuritis, unspecified: Secondary | ICD-10-CM | POA: Diagnosis not present

## 2021-09-18 DIAGNOSIS — M21862 Other specified acquired deformities of left lower leg: Secondary | ICD-10-CM | POA: Diagnosis not present

## 2021-09-24 DIAGNOSIS — R202 Paresthesia of skin: Secondary | ICD-10-CM | POA: Diagnosis not present

## 2021-09-29 DIAGNOSIS — F411 Generalized anxiety disorder: Secondary | ICD-10-CM | POA: Diagnosis not present

## 2021-09-29 DIAGNOSIS — F331 Major depressive disorder, recurrent, moderate: Secondary | ICD-10-CM | POA: Diagnosis not present

## 2021-10-09 DIAGNOSIS — M25372 Other instability, left ankle: Secondary | ICD-10-CM | POA: Diagnosis not present

## 2021-10-09 DIAGNOSIS — G629 Polyneuropathy, unspecified: Secondary | ICD-10-CM | POA: Diagnosis not present

## 2021-10-09 DIAGNOSIS — M21862 Other specified acquired deformities of left lower leg: Secondary | ICD-10-CM | POA: Diagnosis not present

## 2021-10-09 DIAGNOSIS — M722 Plantar fascial fibromatosis: Secondary | ICD-10-CM | POA: Diagnosis not present

## 2021-10-09 DIAGNOSIS — M792 Neuralgia and neuritis, unspecified: Secondary | ICD-10-CM | POA: Diagnosis not present

## 2021-10-28 DIAGNOSIS — Z888 Allergy status to other drugs, medicaments and biological substances status: Secondary | ICD-10-CM | POA: Diagnosis not present

## 2021-10-28 DIAGNOSIS — Z91040 Latex allergy status: Secondary | ICD-10-CM | POA: Diagnosis not present

## 2021-10-28 DIAGNOSIS — Z79899 Other long term (current) drug therapy: Secondary | ICD-10-CM | POA: Diagnosis not present

## 2021-10-28 DIAGNOSIS — R7309 Other abnormal glucose: Secondary | ICD-10-CM | POA: Diagnosis not present

## 2021-10-28 DIAGNOSIS — M722 Plantar fascial fibromatosis: Secondary | ICD-10-CM | POA: Diagnosis not present

## 2021-10-28 DIAGNOSIS — G629 Polyneuropathy, unspecified: Secondary | ICD-10-CM | POA: Diagnosis not present

## 2021-11-02 ENCOUNTER — Other Ambulatory Visit: Payer: Self-pay | Admitting: Cardiology

## 2021-11-02 DIAGNOSIS — E782 Mixed hyperlipidemia: Secondary | ICD-10-CM

## 2021-11-11 DIAGNOSIS — M792 Neuralgia and neuritis, unspecified: Secondary | ICD-10-CM | POA: Diagnosis not present

## 2021-11-11 DIAGNOSIS — R234 Changes in skin texture: Secondary | ICD-10-CM | POA: Diagnosis not present

## 2021-11-11 DIAGNOSIS — G629 Polyneuropathy, unspecified: Secondary | ICD-10-CM | POA: Diagnosis not present

## 2021-11-11 DIAGNOSIS — M722 Plantar fascial fibromatosis: Secondary | ICD-10-CM | POA: Diagnosis not present

## 2021-11-11 DIAGNOSIS — M21862 Other specified acquired deformities of left lower leg: Secondary | ICD-10-CM | POA: Diagnosis not present

## 2021-11-11 DIAGNOSIS — M25372 Other instability, left ankle: Secondary | ICD-10-CM | POA: Diagnosis not present

## 2021-12-09 DIAGNOSIS — G608 Other hereditary and idiopathic neuropathies: Secondary | ICD-10-CM | POA: Diagnosis not present

## 2021-12-09 DIAGNOSIS — G6289 Other specified polyneuropathies: Secondary | ICD-10-CM | POA: Diagnosis not present

## 2021-12-11 ENCOUNTER — Other Ambulatory Visit: Payer: Self-pay | Admitting: Podiatry

## 2021-12-11 DIAGNOSIS — M21862 Other specified acquired deformities of left lower leg: Secondary | ICD-10-CM | POA: Diagnosis not present

## 2021-12-11 DIAGNOSIS — M792 Neuralgia and neuritis, unspecified: Secondary | ICD-10-CM | POA: Diagnosis not present

## 2021-12-11 DIAGNOSIS — R600 Localized edema: Secondary | ICD-10-CM | POA: Diagnosis not present

## 2021-12-11 DIAGNOSIS — M7662 Achilles tendinitis, left leg: Secondary | ICD-10-CM | POA: Diagnosis not present

## 2021-12-11 DIAGNOSIS — G629 Polyneuropathy, unspecified: Secondary | ICD-10-CM | POA: Diagnosis not present

## 2021-12-11 DIAGNOSIS — M25372 Other instability, left ankle: Secondary | ICD-10-CM | POA: Diagnosis not present

## 2021-12-11 DIAGNOSIS — R234 Changes in skin texture: Secondary | ICD-10-CM | POA: Diagnosis not present

## 2021-12-11 DIAGNOSIS — M722 Plantar fascial fibromatosis: Secondary | ICD-10-CM

## 2021-12-22 DIAGNOSIS — Z1231 Encounter for screening mammogram for malignant neoplasm of breast: Secondary | ICD-10-CM | POA: Diagnosis not present

## 2021-12-24 DIAGNOSIS — H52209 Unspecified astigmatism, unspecified eye: Secondary | ICD-10-CM | POA: Diagnosis not present

## 2021-12-24 DIAGNOSIS — H2511 Age-related nuclear cataract, right eye: Secondary | ICD-10-CM | POA: Diagnosis not present

## 2021-12-24 DIAGNOSIS — H524 Presbyopia: Secondary | ICD-10-CM | POA: Diagnosis not present

## 2021-12-24 DIAGNOSIS — E119 Type 2 diabetes mellitus without complications: Secondary | ICD-10-CM | POA: Diagnosis not present

## 2021-12-24 DIAGNOSIS — H2512 Age-related nuclear cataract, left eye: Secondary | ICD-10-CM | POA: Diagnosis not present

## 2021-12-24 DIAGNOSIS — H5203 Hypermetropia, bilateral: Secondary | ICD-10-CM | POA: Diagnosis not present

## 2021-12-25 DIAGNOSIS — F331 Major depressive disorder, recurrent, moderate: Secondary | ICD-10-CM | POA: Diagnosis not present

## 2021-12-25 DIAGNOSIS — F411 Generalized anxiety disorder: Secondary | ICD-10-CM | POA: Diagnosis not present

## 2021-12-28 ENCOUNTER — Other Ambulatory Visit: Payer: Medicare HMO

## 2021-12-29 DIAGNOSIS — R21 Rash and other nonspecific skin eruption: Secondary | ICD-10-CM | POA: Diagnosis not present

## 2021-12-29 DIAGNOSIS — Z23 Encounter for immunization: Secondary | ICD-10-CM | POA: Diagnosis not present

## 2021-12-29 DIAGNOSIS — Z0001 Encounter for general adult medical examination with abnormal findings: Secondary | ICD-10-CM | POA: Diagnosis not present

## 2022-01-08 DIAGNOSIS — M21862 Other specified acquired deformities of left lower leg: Secondary | ICD-10-CM | POA: Diagnosis not present

## 2022-01-08 DIAGNOSIS — M792 Neuralgia and neuritis, unspecified: Secondary | ICD-10-CM | POA: Diagnosis not present

## 2022-01-08 DIAGNOSIS — R600 Localized edema: Secondary | ICD-10-CM | POA: Diagnosis not present

## 2022-01-08 DIAGNOSIS — G629 Polyneuropathy, unspecified: Secondary | ICD-10-CM | POA: Diagnosis not present

## 2022-01-08 DIAGNOSIS — M25372 Other instability, left ankle: Secondary | ICD-10-CM | POA: Diagnosis not present

## 2022-01-08 DIAGNOSIS — I872 Venous insufficiency (chronic) (peripheral): Secondary | ICD-10-CM | POA: Diagnosis not present

## 2022-01-08 DIAGNOSIS — R234 Changes in skin texture: Secondary | ICD-10-CM | POA: Diagnosis not present

## 2022-01-08 DIAGNOSIS — M7662 Achilles tendinitis, left leg: Secondary | ICD-10-CM | POA: Diagnosis not present

## 2022-01-08 DIAGNOSIS — M722 Plantar fascial fibromatosis: Secondary | ICD-10-CM | POA: Diagnosis not present

## 2022-02-05 ENCOUNTER — Other Ambulatory Visit: Payer: Self-pay | Admitting: Cardiology

## 2022-02-05 DIAGNOSIS — I493 Ventricular premature depolarization: Secondary | ICD-10-CM

## 2022-02-16 DIAGNOSIS — E782 Mixed hyperlipidemia: Secondary | ICD-10-CM | POA: Diagnosis not present

## 2022-02-16 DIAGNOSIS — K219 Gastro-esophageal reflux disease without esophagitis: Secondary | ICD-10-CM | POA: Diagnosis not present

## 2022-02-16 DIAGNOSIS — I509 Heart failure, unspecified: Secondary | ICD-10-CM | POA: Diagnosis not present

## 2022-02-17 ENCOUNTER — Ambulatory Visit: Payer: Medicare HMO | Admitting: Cardiology

## 2022-02-17 ENCOUNTER — Encounter: Payer: Self-pay | Admitting: Cardiology

## 2022-02-17 VITALS — BP 105/60 | HR 53 | Resp 16 | Ht 63.0 in | Wt 196.0 lb

## 2022-02-17 DIAGNOSIS — E782 Mixed hyperlipidemia: Secondary | ICD-10-CM

## 2022-02-17 DIAGNOSIS — I5020 Unspecified systolic (congestive) heart failure: Secondary | ICD-10-CM

## 2022-02-17 DIAGNOSIS — R0609 Other forms of dyspnea: Secondary | ICD-10-CM

## 2022-02-17 DIAGNOSIS — I493 Ventricular premature depolarization: Secondary | ICD-10-CM | POA: Diagnosis not present

## 2022-02-17 MED ORDER — METOPROLOL SUCCINATE ER 50 MG PO TB24: 50.0000 mg | ORAL_TABLET | Freq: Every day | ORAL | 3 refills | Status: DC

## 2022-02-17 MED ORDER — FLECAINIDE ACETATE 50 MG PO TABS: 50.0000 mg | ORAL_TABLET | Freq: Two times a day (BID) | ORAL | 3 refills | Status: DC

## 2022-02-17 MED ORDER — SPIRONOLACTONE 25 MG PO TABS
ORAL_TABLET | ORAL | 3 refills | Status: DC
Start: 2022-02-17 — End: 2023-03-02

## 2022-02-17 MED ORDER — ROSUVASTATIN CALCIUM 10 MG PO TABS: 10.0000 mg | ORAL_TABLET | Freq: Every day | ORAL | 3 refills | Status: DC

## 2022-02-17 NOTE — Progress Notes (Signed)
Patient referred by [name], MD for palpitations  Subjective:   Tammy Sandoval, female    DOB: [birth year], 66 y.o.   MRN: [id number]   Chief Complaint  Patient presents with   PVC   Follow-up    6 month   HPI  66 y.o. Caucasian female with hypertension, mixed hyperlipidemia, HFmrEF, frequent PVCs   Patient is doing well.  No recent complaints. She is complaint with her medical therapy.   Current Outpatient Medications:    acyclovir ointment (ZOVIRAX) 5 %, , Disp: , Rfl:    albuterol (VENTOLIN HFA) 108 (90 Base) MCG/ACT inhaler, Inhale 2 puffs into the lungs every 6 (six) hours as needed for wheezing or shortness of breath., Disp: 8 g, Rfl: 6   ARIPiprazole (ABILIFY) 15 MG tablet, Take 7.5 mg by mouth daily. , Disp: , Rfl: 0   flecainide (TAMBOCOR) 50 MG tablet, Take 1 tablet (50 mg total) by mouth 2 (two) times daily., Disp: 180 tablet, Rfl: 3   fluticasone furoate-vilanterol (BREO ELLIPTA) 200-25 MCG/ACT AEPB, Inhale 1 puff into the lungs daily., Disp: 60 each, Rfl: 3   gabapentin (NEURONTIN) 600 MG tablet, Take 1,200-1,800 mg by mouth See admin instructions. Take 1200 mg by mouth in the morning and 1800 mg in the evening, Disp: , Rfl: 0   gabapentin (NEURONTIN) 600 MG tablet, , Disp: , Rfl:    hydrOXYzine (ATARAX/VISTARIL) 25 MG tablet, Take 25 mg by mouth daily., Disp: , Rfl:    meloxicam (MOBIC) 15 MG tablet, Take 15 mg by mouth daily., Disp: , Rfl:    metoprolol succinate (TOPROL-XL) 50 MG 24 hr tablet, TAKE 1 TABLET(50 MG) BY MOUTH DAILY WITH OR IMMEDIATELY FOLLOWING A MEAL, Disp: 90 tablet, Rfl: 3   omeprazole (PRILOSEC) 40 MG capsule, Take 1 capsule by mouth daily., Disp: , Rfl:    rosuvastatin (CRESTOR) 10 MG tablet, Take 10 mg by mouth at bedtime., Disp: , Rfl:    spironolactone (ALDACTONE) 25 MG tablet, TAKE 1 TABLET(25 MG) BY MOUTH DAILY, Disp: 90 tablet, Rfl: 0   trazodone (DESYREL) 300 MG tablet, Take 300 mg by mouth at bedtime. , Disp: , Rfl: 0    venlafaxine XR (EFFEXOR-XR) 150 MG 24 hr capsule, Take 1 capsule (150 mg total) by mouth daily. (Patient taking differently: Take 300 mg by mouth daily.), Disp: , Rfl:   Cardiovascular and other pertinent studies:  EKG 02/17/2022: Sinus rhythm 60 bpm  Normal EKG  Echocardiogram 08/03/2021:  Normal LV systolic function with visual EF 55-60%. Left ventricle cavity  is normal in size. Normal left ventricular wall thickness. Normal global  wall motion. Normal diastolic filling pattern, normal LAP.  Mild tricuspid regurgitation. No evidence of pulmonary hypertension.  Compared to 07/31/2020 LVEF improved from 45-50% to 55-60%, G1DD and  moderate LAE are now normal.   Mobile cardiac telemetry 4 days 11/13/2020 - 11/17/2020: Dominant rhythm: Sinus. HR 62-133 bpm. Avg HR 91 bpm, in sinus rhythm. 1 episodes of SVT, at 154 bpm for 5 beats. <1% isolated SVE, couplet, no triplets. 0 episodes of VT. 11.5% isolated VE-including ventricular bigeminy and trigeminy, <1 couplet/triplets. No atrial fibrillation/atrial flutter/VT/high grade AV block, sinus pause >3sec noted. 0 patient triggered events.    RHC/LHC 09/16/2020: LM: Essentially nonexistent with dual ostia for LAD and LCx LAD: Normal LCx: Mid focal 40% stenosis RCA: Normal   Normal filling pressures   Single vessel mod CAD without pulmonary hypertension Unlikely to be the cause for her exertional dyspnea  Recommend medial management Seek alternate cause for exertional dyspnea  Lexiscan Tetrofosmin stress test 09/01/2020: Lexiscan nuclear stress test performed using 1-day protocol.  SPECT images show apical thinning, likely physiological, as well as small sized, mild intensity, partially reversible perfusion defect in basal anteroseptal myocardium. Stress LVEF calculated 33%, although visually appears low normal.  Intermediate risk study.    Abdominal Aortic Duplex 01/30/2020:  Normal abdominal aorta. No evidence of aneurysm.  No  significant  atherosclerotic changes noted. No AAA.  Minimal increase in the left common iliac artery.   Recent labs: 10/28/2021: Glucose 79, BUN/Cr 16/0.95. EGFR 67. Na/K 142/4.3. Rest of the CMP normal TSH 2.6 normal  09/12/2020: Chol 158, TG 97, HDL 54, LDL 86  12/26/2019: Chol 201, TG 92, HDL 48, LDL 136   Review of Systems  Cardiovascular:  Negative for chest pain, dyspnea on exertion, leg swelling, palpitations and syncope.         Vitals:   02/17/22 1019  BP: 105/60  Pulse: (!) 53  Resp: 16  SpO2: 96%    Body mass index is 34.72 kg/m. Filed Weights   02/17/22 1019  Weight: 196 lb (88.9 kg)     Objective:   Physical Exam Vitals and nursing note reviewed.  Constitutional:      General: She is not in acute distress. Neck:     Vascular: No JVD.  Cardiovascular:     Rate and Rhythm: Normal rate and regular rhythm. Frequent Extrasystoles are present.    Heart sounds: Normal heart sounds. No murmur heard. Pulmonary:     Effort: Pulmonary effort is normal.     Breath sounds: Normal breath sounds. No wheezing or rales.  Musculoskeletal:     Right lower leg: No edema.     Left lower leg: No edema.       ICD-10-CM   1. Mixed hyperlipidemia  E78.2     2. Symptomatic PVCs  I49.3 EKG 12-Lead    PCV CARDIAC STRESS TEST    3. HFrEF (heart failure with reduced ejection fraction) (HCC)  I50.20           Assessment & Recommendations:    66 y.o. Caucasian female with hypertension, mixed hyperlipidemia, HFmrEF, frequent PVCs   Frequent PVCs: Likely etiology for her HFmrEF and nonischemic cardiomyopathy. EF now normalized.  Continue flecainide, and metoprolol succinate. Refilled today.  Continue spironolactone 25 mg daily for now.  It is helping hypertension.  Her dyspnea may be out of proportion to her cardiac findings. Deconditioning likely.   Mixed hyperlipidemia: Fairly well controlled on Crestor 10 mg  Exercise treadmill stress test (given  ongoing flecainide use) and f/u in 1 year    Nigel Mormon, MD Pager: [phone] Office: [phone]

## 2022-03-02 DIAGNOSIS — F418 Other specified anxiety disorders: Secondary | ICD-10-CM | POA: Diagnosis not present

## 2022-03-02 DIAGNOSIS — R7303 Prediabetes: Secondary | ICD-10-CM | POA: Diagnosis not present

## 2022-03-02 DIAGNOSIS — I10 Essential (primary) hypertension: Secondary | ICD-10-CM | POA: Diagnosis not present

## 2022-03-02 DIAGNOSIS — R7989 Other specified abnormal findings of blood chemistry: Secondary | ICD-10-CM | POA: Diagnosis not present

## 2022-03-02 DIAGNOSIS — I5023 Acute on chronic systolic (congestive) heart failure: Secondary | ICD-10-CM | POA: Diagnosis not present

## 2022-03-02 DIAGNOSIS — E782 Mixed hyperlipidemia: Secondary | ICD-10-CM | POA: Diagnosis not present

## 2022-03-05 DIAGNOSIS — M7662 Achilles tendinitis, left leg: Secondary | ICD-10-CM | POA: Diagnosis not present

## 2022-03-05 DIAGNOSIS — R600 Localized edema: Secondary | ICD-10-CM | POA: Diagnosis not present

## 2022-03-05 DIAGNOSIS — M21862 Other specified acquired deformities of left lower leg: Secondary | ICD-10-CM | POA: Diagnosis not present

## 2022-03-05 DIAGNOSIS — I872 Venous insufficiency (chronic) (peripheral): Secondary | ICD-10-CM | POA: Diagnosis not present

## 2022-03-05 DIAGNOSIS — M722 Plantar fascial fibromatosis: Secondary | ICD-10-CM | POA: Diagnosis not present

## 2022-03-05 DIAGNOSIS — M792 Neuralgia and neuritis, unspecified: Secondary | ICD-10-CM | POA: Diagnosis not present

## 2022-03-05 DIAGNOSIS — R234 Changes in skin texture: Secondary | ICD-10-CM | POA: Diagnosis not present

## 2022-03-05 DIAGNOSIS — M25372 Other instability, left ankle: Secondary | ICD-10-CM | POA: Diagnosis not present

## 2022-03-05 DIAGNOSIS — G629 Polyneuropathy, unspecified: Secondary | ICD-10-CM | POA: Diagnosis not present

## 2022-03-21 DIAGNOSIS — M549 Dorsalgia, unspecified: Secondary | ICD-10-CM | POA: Diagnosis not present

## 2022-03-21 DIAGNOSIS — R1012 Left upper quadrant pain: Secondary | ICD-10-CM | POA: Diagnosis not present

## 2022-03-23 DIAGNOSIS — F331 Major depressive disorder, recurrent, moderate: Secondary | ICD-10-CM | POA: Diagnosis not present

## 2022-03-23 DIAGNOSIS — F411 Generalized anxiety disorder: Secondary | ICD-10-CM | POA: Diagnosis not present

## 2022-03-26 ENCOUNTER — Other Ambulatory Visit: Payer: Self-pay | Admitting: Cardiology

## 2022-03-26 DIAGNOSIS — I5020 Unspecified systolic (congestive) heart failure: Secondary | ICD-10-CM

## 2022-03-26 DIAGNOSIS — R0609 Other forms of dyspnea: Secondary | ICD-10-CM

## 2022-03-29 DIAGNOSIS — G629 Polyneuropathy, unspecified: Secondary | ICD-10-CM | POA: Diagnosis not present

## 2022-03-29 DIAGNOSIS — Z79891 Long term (current) use of opiate analgesic: Secondary | ICD-10-CM | POA: Diagnosis not present

## 2022-03-29 DIAGNOSIS — Z5181 Encounter for therapeutic drug level monitoring: Secondary | ICD-10-CM | POA: Diagnosis not present

## 2022-03-29 DIAGNOSIS — G894 Chronic pain syndrome: Secondary | ICD-10-CM | POA: Diagnosis not present

## 2022-03-29 DIAGNOSIS — M47816 Spondylosis without myelopathy or radiculopathy, lumbar region: Secondary | ICD-10-CM | POA: Diagnosis not present

## 2022-04-07 DIAGNOSIS — I739 Peripheral vascular disease, unspecified: Secondary | ICD-10-CM | POA: Diagnosis not present

## 2022-04-21 DIAGNOSIS — M5124 Other intervertebral disc displacement, thoracic region: Secondary | ICD-10-CM | POA: Diagnosis not present

## 2022-04-21 DIAGNOSIS — M4804 Spinal stenosis, thoracic region: Secondary | ICD-10-CM | POA: Diagnosis not present

## 2022-05-05 DIAGNOSIS — S61252A Open bite of right middle finger without damage to nail, initial encounter: Secondary | ICD-10-CM | POA: Diagnosis not present

## 2022-05-05 DIAGNOSIS — L03011 Cellulitis of right finger: Secondary | ICD-10-CM | POA: Diagnosis not present

## 2022-05-19 DIAGNOSIS — I739 Peripheral vascular disease, unspecified: Secondary | ICD-10-CM | POA: Diagnosis not present

## 2022-05-19 DIAGNOSIS — G629 Polyneuropathy, unspecified: Secondary | ICD-10-CM | POA: Diagnosis not present

## 2022-05-21 DIAGNOSIS — I10 Essential (primary) hypertension: Secondary | ICD-10-CM | POA: Diagnosis not present

## 2022-05-21 DIAGNOSIS — R7989 Other specified abnormal findings of blood chemistry: Secondary | ICD-10-CM | POA: Diagnosis not present

## 2022-05-21 DIAGNOSIS — R7303 Prediabetes: Secondary | ICD-10-CM | POA: Diagnosis not present

## 2022-05-21 DIAGNOSIS — I5023 Acute on chronic systolic (congestive) heart failure: Secondary | ICD-10-CM | POA: Diagnosis not present

## 2022-05-21 DIAGNOSIS — Z0000 Encounter for general adult medical examination without abnormal findings: Secondary | ICD-10-CM | POA: Diagnosis not present

## 2022-05-21 DIAGNOSIS — D509 Iron deficiency anemia, unspecified: Secondary | ICD-10-CM | POA: Diagnosis not present

## 2022-05-21 DIAGNOSIS — B9689 Other specified bacterial agents as the cause of diseases classified elsewhere: Secondary | ICD-10-CM | POA: Diagnosis not present

## 2022-05-21 DIAGNOSIS — J40 Bronchitis, not specified as acute or chronic: Secondary | ICD-10-CM | POA: Diagnosis not present

## 2022-05-21 DIAGNOSIS — J208 Acute bronchitis due to other specified organisms: Secondary | ICD-10-CM | POA: Diagnosis not present

## 2022-05-21 DIAGNOSIS — E782 Mixed hyperlipidemia: Secondary | ICD-10-CM | POA: Diagnosis not present

## 2022-06-17 DIAGNOSIS — K219 Gastro-esophageal reflux disease without esophagitis: Secondary | ICD-10-CM | POA: Diagnosis not present

## 2022-06-17 DIAGNOSIS — I509 Heart failure, unspecified: Secondary | ICD-10-CM | POA: Diagnosis not present

## 2022-06-17 DIAGNOSIS — E782 Mixed hyperlipidemia: Secondary | ICD-10-CM | POA: Diagnosis not present

## 2022-06-18 DIAGNOSIS — E782 Mixed hyperlipidemia: Secondary | ICD-10-CM | POA: Diagnosis not present

## 2022-06-18 DIAGNOSIS — D509 Iron deficiency anemia, unspecified: Secondary | ICD-10-CM | POA: Diagnosis not present

## 2022-06-18 DIAGNOSIS — I5023 Acute on chronic systolic (congestive) heart failure: Secondary | ICD-10-CM | POA: Diagnosis not present

## 2022-06-18 DIAGNOSIS — R7989 Other specified abnormal findings of blood chemistry: Secondary | ICD-10-CM | POA: Diagnosis not present

## 2022-06-18 DIAGNOSIS — I10 Essential (primary) hypertension: Secondary | ICD-10-CM | POA: Diagnosis not present

## 2022-06-18 DIAGNOSIS — G629 Polyneuropathy, unspecified: Secondary | ICD-10-CM | POA: Diagnosis not present

## 2022-06-18 DIAGNOSIS — R7303 Prediabetes: Secondary | ICD-10-CM | POA: Diagnosis not present

## 2022-07-02 DIAGNOSIS — F331 Major depressive disorder, recurrent, moderate: Secondary | ICD-10-CM | POA: Diagnosis not present

## 2022-07-02 DIAGNOSIS — F411 Generalized anxiety disorder: Secondary | ICD-10-CM | POA: Diagnosis not present

## 2022-11-08 ENCOUNTER — Other Ambulatory Visit: Payer: Self-pay | Admitting: Cardiology

## 2022-11-08 DIAGNOSIS — I493 Ventricular premature depolarization: Secondary | ICD-10-CM

## 2023-01-25 ENCOUNTER — Other Ambulatory Visit: Payer: Self-pay | Admitting: Cardiology

## 2023-01-25 DIAGNOSIS — I493 Ventricular premature depolarization: Secondary | ICD-10-CM

## 2023-01-27 ENCOUNTER — Ambulatory Visit: Payer: Medicare HMO | Attending: Cardiology

## 2023-01-27 DIAGNOSIS — I493 Ventricular premature depolarization: Secondary | ICD-10-CM

## 2023-01-27 LAB — EXERCISE TOLERANCE TEST
Angina Index: 0
Duke Treadmill Score: 1
Estimated workload: 3.3
Exercise duration (min): 1 min
Exercise duration (sec): 9 s
MPHR: 153 {beats}/min
Peak HR: 106 {beats}/min
Percent HR: 69 %
RPE: 17
Rest HR: 78 {beats}/min
ST Depression (mm): 0 mm

## 2023-02-07 ENCOUNTER — Other Ambulatory Visit: Payer: Self-pay | Admitting: Cardiology

## 2023-02-07 DIAGNOSIS — I493 Ventricular premature depolarization: Secondary | ICD-10-CM

## 2023-02-24 ENCOUNTER — Other Ambulatory Visit: Payer: Self-pay | Admitting: Cardiology

## 2023-02-24 DIAGNOSIS — I493 Ventricular premature depolarization: Secondary | ICD-10-CM

## 2023-03-02 ENCOUNTER — Encounter: Payer: Self-pay | Admitting: Cardiology

## 2023-03-02 ENCOUNTER — Ambulatory Visit: Payer: Medicare HMO | Attending: Cardiology | Admitting: Cardiology

## 2023-03-02 VITALS — BP 104/64 | HR 71 | Resp 16 | Ht 63.0 in | Wt 175.0 lb

## 2023-03-02 DIAGNOSIS — R0609 Other forms of dyspnea: Secondary | ICD-10-CM

## 2023-03-02 DIAGNOSIS — E782 Mixed hyperlipidemia: Secondary | ICD-10-CM

## 2023-03-02 DIAGNOSIS — I493 Ventricular premature depolarization: Secondary | ICD-10-CM

## 2023-03-02 DIAGNOSIS — I5020 Unspecified systolic (congestive) heart failure: Secondary | ICD-10-CM

## 2023-03-02 MED ORDER — METOPROLOL SUCCINATE ER 50 MG PO TB24: 50.0000 mg | ORAL_TABLET | Freq: Every day | ORAL | 3 refills | Status: DC

## 2023-03-02 MED ORDER — SPIRONOLACTONE 25 MG PO TABS: ORAL_TABLET | ORAL | 3 refills | Status: DC

## 2023-03-02 MED ORDER — FLECAINIDE ACETATE 50 MG PO TABS: 50.0000 mg | ORAL_TABLET | Freq: Two times a day (BID) | ORAL | 6 refills | Status: DC

## 2023-03-02 NOTE — Patient Instructions (Signed)
 Medication Instructions:   Your physician recommends that you continue on your current medications as directed. Please refer to the Current Medication list given to you today. *If you need a refill on your cardiac medications before your next appointment, please call your pharmacy*   Lab Work:  TODAY--LIPIDS  If you have labs (blood work) drawn today and your tests are completely normal, you will receive your results only by: MyChart Message (if you have MyChart) OR A paper copy in the mail If you have any lab test that is abnormal or we need to change your treatment, we will call you to review the results.    Follow-Up: At [facility], you and your health needs are our priority.  As part of our continuing mission to provide you with exceptional heart care, we have created designated Provider Care Teams.  These Care Teams include your primary Cardiologist (physician) and Advanced Practice Providers (APPs -  Physician Assistants and Nurse Practitioners) who all work together to provide you with the care you need, when you need it.  We recommend signing up for the patient portal called "MyChart".  Sign up information is provided on this After Visit Summary.  MyChart is used to connect with patients for Virtual Visits (Telemedicine).  Patients are able to view lab/test results, encounter notes, upcoming appointments, etc.  Non-urgent messages can be sent to your provider as well.   To learn more about what you can do with MyChart, go to [email or web address].    Your next appointment:   1 year(s)  Provider:   DR. Rosemary Holms

## 2023-03-02 NOTE — Progress Notes (Addendum)
Cardiology Office Note:  .   Date:  03/02/2023  ID:  Tammy Sandoval, DOB 08-Apr-1956, MRN [id number] PCP: Jackie Plum, MD  [facility] Providers Cardiologist:  Truett Mainland, MD PCP: Jackie Plum, MD  Chief Complaint  Patient presents with   premature ventricular contraction   Hyperlipidemia   Follow-up      History of Present Illness: .    Tammy Sandoval is a 67 y.o. female with  hypertension, mixed hyperlipidemia, HFmrEF, frequent PVCs    Patient is doing well, denies chest pain, shortness of breath, palpitations, leg edema, orthopnea, PND, TIA/syncope. PVC symptoms are very well controlled on Flecainide.    Vitals:   03/02/23 0910  BP: 104/64  Pulse: 71  Resp: 16  SpO2: 93%     ROS:  Review of Systems  Cardiovascular:  Negative for chest pain, dyspnea on exertion, leg swelling, palpitations and syncope.     Studies Reviewed: Marland Kitchen       EKG 03/02/2023: Normal sinus rhythm Nonspecific T wave abnormality When compared with ECG of 10-Jun-2019 14:47,  rate is slower     Exercise treadmill stress test 01/27/2023:   [place] ECG is abnormal. ECG rhythm shows normal sinus rhythm. Nonspecific ST/T wave abnormality. ECG demonstrates incomplete right bundle branch block.   A Bruce protocol stress test was performed. Exercise capacity was severely impaired. Patient exercised for 1 min and 9 sec. Maximum HR of 106 bpm. MPHR 69.0%. Peak METS 3.3. The patient experienced no angina during the test. The patient reported dyspnea and muscle fatigue during the stress test. Normal blood pressure and normal heart rate response noted during stress. Heart rate recovery was normal.   No ST deviation was noted. There were no arrhythmias during stress. There were no arrhythmias during recovery. ECG was interpretable and without significant changes. The ECG was not diagnostic due to failure to achieve 85% MAPHR.   No exercise induced arrhythmias on  Flecainide.  Physical Exam:   Physical Exam Vitals and nursing note reviewed.  Constitutional:      General: She is not in acute distress. Neck:     Vascular: No JVD.  Cardiovascular:     Rate and Rhythm: Normal rate and regular rhythm.     Heart sounds: Normal heart sounds. No murmur heard. Pulmonary:     Effort: Pulmonary effort is normal.     Breath sounds: Normal breath sounds. No wheezing or rales.  Musculoskeletal:     Right lower leg: No edema.     Left lower leg: No edema.      VISIT DIAGNOSES:   ICD-10-CM   1. PVC (premature ventricular contraction)  I49.3 EKG 12-Lead    flecainide (TAMBOCOR) 50 MG tablet    metoprolol succinate (TOPROL-XL) 50 MG 24 hr tablet    spironolactone (ALDACTONE) 25 MG tablet    2. Symptomatic PVCs  I49.3 flecainide (TAMBOCOR) 50 MG tablet    metoprolol succinate (TOPROL-XL) 50 MG 24 hr tablet    spironolactone (ALDACTONE) 25 MG tablet    3. HFrEF (heart failure with reduced ejection fraction) (HCC)  I50.20 flecainide (TAMBOCOR) 50 MG tablet    metoprolol succinate (TOPROL-XL) 50 MG 24 hr tablet    spironolactone (ALDACTONE) 25 MG tablet    4. Exertional dyspnea  R06.09 flecainide (TAMBOCOR) 50 MG tablet    metoprolol succinate (TOPROL-XL) 50 MG 24 hr tablet    spironolactone (ALDACTONE) 25 MG tablet    5. Mixed hyperlipidemia  E78.2 Lipid Profile  ASSESSMENT AND PLAN: .    Tammy Sandoval is a 67 y.o. female with hypertension, mixed hyperlipidemia, HFmrEF, frequent PVCs    Frequent PVCs: Likely etiology for her HFmrEF and nonischemic cardiomyopathy. EF now normalized.  Continue flecainide, and metoprolol succinate. Refilled today.  Continue spironolactone 25 mg daily as it is helping hypertension.    Mixed hyperlipidemia: Fairly well controlled on Crestor 10 mg Check lipid panel today. Will refill Crestor based on lipid panel results, at 10 mg daily or 20 mg daily.      Meds ordered this encounter  Medications    flecainide (TAMBOCOR) 50 MG tablet    Sig: Take 1 tablet (50 mg total) by mouth 2 (two) times daily.    Dispense:  60 tablet    Refill:  6   metoprolol succinate (TOPROL-XL) 50 MG 24 hr tablet    Sig: Take 1 tablet (50 mg total) by mouth daily. Take with or immediately following a meal.    Dispense:  90 tablet    Refill:  3   spironolactone (ALDACTONE) 25 MG tablet    Sig: Daily    Dispense:  90 tablet    Refill:  3    **Patient requests 90 days supply**     F/u in 1 year  Signed, Elder Negus, MD

## 2023-03-03 LAB — LIPID PANEL
Chol/HDL Ratio: 3.3 ratio (ref 0.0–4.4)
Cholesterol, Total: 147 mg/dL (ref 100–199)
HDL: 44 mg/dL (ref >39)
LDL Chol Calc (NIH): 75 mg/dL (ref 0–99)
Triglycerides: 162 mg/dL — ABNORMAL HIGH (ref 0–149)
VLDL Cholesterol Cal: 28 mg/dL (ref 5–40)

## 2023-03-03 NOTE — Progress Notes (Signed)
Triglycerdie slightly elevated, otherwise cholesteorl looks el lcontrolled. Okay to continue same dose statin. Please send Crestor 10 mg daily refill for 90 daysX3 refills.  Thanks MJP

## 2023-03-04 ENCOUNTER — Telehealth: Payer: Self-pay | Admitting: *Deleted

## 2023-03-04 DIAGNOSIS — E782 Mixed hyperlipidemia: Secondary | ICD-10-CM

## 2023-03-04 MED ORDER — ROSUVASTATIN CALCIUM 10 MG PO TABS: 10.0000 mg | ORAL_TABLET | Freq: Every day | ORAL | 3 refills | Status: DC

## 2023-03-04 NOTE — Telephone Encounter (Signed)
Pt made aware of results via mychart.  Will send in her refill of crestor 10 mg daily to her pharmacy on file with several refills.

## 2023-03-04 NOTE — Telephone Encounter (Signed)
-----   Message from [facility] sent at 03/04/2023  6:22 AM EST -----  ----- Message ----- From: Elder Negus, MD Sent: 03/03/2023  11:11 AM EST To: [facility] Triage  Triglycerdie slightly elevated, otherwise cholesteorl looks el lcontrolled. Okay to continue same dose statin. Please send Crestor 10 mg daily refill for 90 daysX3 refills.  Thanks MJP

## 2023-03-30 ENCOUNTER — Other Ambulatory Visit: Payer: Self-pay | Admitting: Cardiology

## 2023-03-30 DIAGNOSIS — I5020 Unspecified systolic (congestive) heart failure: Secondary | ICD-10-CM

## 2023-03-30 DIAGNOSIS — R0609 Other forms of dyspnea: Secondary | ICD-10-CM

## 2023-03-30 DIAGNOSIS — I493 Ventricular premature depolarization: Secondary | ICD-10-CM

## 2023-05-06 ENCOUNTER — Other Ambulatory Visit: Payer: Self-pay | Admitting: Cardiology

## 2023-05-06 DIAGNOSIS — E782 Mixed hyperlipidemia: Secondary | ICD-10-CM

## 2023-08-30 ENCOUNTER — Other Ambulatory Visit: Payer: Self-pay | Admitting: Nurse Practitioner

## 2023-08-30 DIAGNOSIS — E2839 Other primary ovarian failure: Secondary | ICD-10-CM

## 2023-09-14 ENCOUNTER — Telehealth: Payer: Self-pay

## 2023-09-14 NOTE — Telephone Encounter (Signed)
   Name: Tammy Sandoval  DOB: [birth year]  MRN: [id number]  Primary Cardiologist: None   Preoperative team, please contact this patient and set up a phone call appointment for further preoperative risk assessment. Please obtain consent and complete medication review. Thank you for your help.  I confirm that guidance regarding antiplatelet and oral anticoagulation therapy has been completed and, if necessary, noted below.  I also confirmed the patient resides in the state of  . As per [facility] Medical Board telemedicine laws, the patient must reside in the state in which the provider is licensed.   Leala Prince, PA-C 09/14/2023, 12:44 PM [facility]

## 2023-09-14 NOTE — Telephone Encounter (Signed)
 Pt scheduled for virtual visit on 09/27/23.

## 2023-09-14 NOTE — Telephone Encounter (Signed)
   Pre-operative Risk Assessment    Patient Name: Tammy Sandoval  DOB: [birth year] MRN: [id number]   Date of last office visit: 03/02/23 Date of next office visit: Not scheduled   Request for Surgical Clearance    Procedure:  Colonoscopy  Date of Surgery:  Clearance 10/06/23                                Surgeon:  Dr. Cheral Cordoba Group or Practice Name:  [facility], [place] Phone number:  [phone] Fax number:  [phone]   Type of Clearance Requested:   - Medical    Type of Anesthesia:  Propofol    Additional requests/questions:    Gardiner Jumper   09/14/2023, 11:54 AM

## 2023-09-14 NOTE — Telephone Encounter (Signed)
  Patient Consent for Virtual Visit        Tammy Sandoval has provided verbal consent on 09/14/2023 for a virtual visit (video or telephone).   CONSENT FOR VIRTUAL VISIT FOR:  Tammy Sandoval  By participating in this virtual visit I agree to the following:  I hereby voluntarily request, consent and authorize [facility] and its employed or contracted physicians, physician assistants, nurse practitioners or other licensed health care professionals (the Practitioner), to provide me with telemedicine health care services (the "Services") as deemed necessary by the treating Practitioner. I acknowledge and consent to receive the Services by the Practitioner via telemedicine. I understand that the telemedicine visit will involve communicating with the Practitioner through live audiovisual communication technology and the disclosure of certain medical information by electronic transmission. I acknowledge that I have been given the opportunity to request an in-person assessment or other available alternative prior to the telemedicine visit and am voluntarily participating in the telemedicine visit.  I understand that I have the right to withhold or withdraw my consent to the use of telemedicine in the course of my care at any time, without affecting my right to future care or treatment, and that the Practitioner or I may terminate the telemedicine visit at any time. I understand that I have the right to inspect all information obtained and/or recorded in the course of the telemedicine visit and may receive copies of available information for a reasonable fee.  I understand that some of the potential risks of receiving the Services via telemedicine include:  Delay or interruption in medical evaluation due to technological equipment failure or disruption; Information transmitted may not be sufficient (e.g. poor resolution of images) to allow for appropriate medical decision making by the  Practitioner; and/or  In rare instances, security protocols could fail, causing a breach of personal health information.  Furthermore, I acknowledge that it is my responsibility to provide information about my medical history, conditions and care that is complete and accurate to the best of my ability. I acknowledge that Practitioner's advice, recommendations, and/or decision may be based on factors not within their control, such as incomplete or inaccurate data provided by me or distortions of diagnostic images or specimens that may result from electronic transmissions. I understand that the practice of medicine is not an exact science and that Practitioner makes no warranties or guarantees regarding treatment outcomes. I acknowledge that a copy of this consent can be made available to me via my patient portal [facility] MyChart), or I can request a printed copy by calling the office of [facility].    I understand that my insurance will be billed for this visit.   I have read or had this consent read to me. I understand the contents of this consent, which adequately explains the benefits and risks of the Services being provided via telemedicine.  I have been provided ample opportunity to ask questions regarding this consent and the Services and have had my questions answered to my satisfaction. I give my informed consent for the services to be provided through the use of telemedicine in my medical care

## 2023-09-27 ENCOUNTER — Ambulatory Visit: Attending: Cardiology | Admitting: Emergency Medicine

## 2023-09-27 DIAGNOSIS — Z01810 Encounter for preprocedural cardiovascular examination: Secondary | ICD-10-CM | POA: Diagnosis not present

## 2023-09-27 NOTE — Progress Notes (Signed)
 Virtual Visit via Telephone Note   Because of Tammy Sandoval co-morbid illnesses, she is at least at moderate risk for complications without adequate follow up.  This format is felt to be most appropriate for this patient at this time.  Due to technical limitations with video connection (technology), today's appointment will be conducted as an audio only telehealth visit, and Tammy Sandoval verbally agreed to proceed in this manner.   All issues noted in this document were discussed and addressed.  No physical exam could be performed with this format.  Evaluation Performed:  Preoperative cardiovascular risk assessment _____________   Date:  09/27/2023   Patient ID:  Tammy Sandoval, DOB 05-14-1955, MRN [id number] Patient Location:  Home Provider location:   Office  Primary Care Provider:  Tretha Fu, MD Primary Cardiologist:  None  Chief Complaint / Patient Profile   68 y.o. y/o female with a h/o PVCs, HFrEF, exertional dyspnea, hyperlipidemia who is pending colonoscopy on 10/06/2023 with [facility] and presents today for telephonic preoperative cardiovascular risk assessment.  History of Present Illness    Tammy Sandoval is a 68 y.o. female who presents via audio/video conferencing for a telehealth visit today.  Pt was last seen in cardiology clinic on 03/02/2023 by Dr. Filiberto Hug.  At that time Tammy Sandoval was doing well.  The patient is now pending procedure as outlined above. Since her last visit, she denies chest pain, lower extremity edema, fatigue, palpitations, melena, hematuria, hemoptysis, diaphoresis, weakness, presyncope, syncope, orthopnea, and PND.  Today patient is doing well overall.  She denies acute cardiovascular concerns or complaints at this time.  She denies any chest pains or exertional symptoms.  She denies any angina, no indication for further ischemic evaluation at this time.  She does note that she does get anxious/stressed which  will sometimes cause her to be short of breath.  She tells me she still does feel her PVCs.  She does feel like they are still well-controlled.  She states that she may feel them just a little bit more over the past 3 months however she denies any limitation to her activity or symptoms of HF exacerbation.  She denies any dyspnea on exertion.  Overall she stays relatively active she does a lot of work out in her yard.  Currently making flower beds.  Able to walk up a flight of stairs and a block without limitation.  Overall she is able to complete greater than 4 METS.  Past Medical History    Past Medical History:  Diagnosis Date   Anemia    Anxiety    Arthritis    Cancer (HCC)    skin-surgically removed   Crescendo transient ischemic attacks    pt has no idea about this, has never had TIA's or strokes   Depression    Dyspnea    with exertion   Generalized headaches    GERD (gastroesophageal reflux disease)    Insomnia    Irritable bowel syndrome    mostly constipation   Leg pain    Low back pain    Obesity    Palpitations    Pre-diabetes    hx of    Restless leg    Seizures (HCC) 2013   only one she has ever had, placed on neurontin    Thrombophlebitis    Ulcer    RLE   Varicose veins    Past Surgical History:  Procedure Laterality Date   APPENDECTOMY  BREAST LUMPECTOMY     bilateral   CERVICAL DISC ARTHROPLASTY N/A 08/22/2015   Procedure: Cervical five - six Cervical six - seven arthroplasty;  Surgeon: Audie Bleacher, MD;  Location: [facility];  Service: Neurosurgery;  Laterality: N/A;  C56 C67 arthroplasty   CESAREAN SECTION     COLONOSCOPY     KNEE ARTHROPLASTY Right 02/23/2016   Procedure: COMPUTER ASSISTED TOTAL KNEE ARTHROPLASTY NAVIGATION;  Surgeon: Adonica Hoose, MD;  Location: [facility] OR;  Service: Orthopedics;  Laterality: Right;   KNEE ARTHROPLASTY Left 03/15/2018   Procedure: COMPUTER ASSISTED TOTAL KNEE ARTHROPLASTY;  Surgeon: Adonica Hoose, MD;  Location: [facility]  ORS;  Service: Orthopedics;  Laterality: Left;   LEG SURGERY  2004   mass removed on right leg   RIGHT/LEFT HEART CATH AND CORONARY ANGIOGRAPHY N/A 09/16/2020   Procedure: RIGHT/LEFT HEART CATH AND CORONARY ANGIOGRAPHY;  Surgeon: Cody Das, MD;  Location: [facility];  Service: Cardiovascular;  Laterality: N/A;   skin cancer removal  2012 & 2013   2 places on face    Allergies  Allergies  Allergen Reactions   Latex Swelling    Redness,swelling   Wellbutrin [Bupropion Hcl] Itching and Other (See Comments)    HEAD TINGLES INTOLERANCE: MADE PT. LOOPY    Home Medications    Prior to Admission medications   Medication Sig Start Date End Date Taking? Authorizing Provider  ARIPiprazole  (ABILIFY ) 15 MG tablet Take 7.5 mg by mouth daily.  02/09/16   [provider]  flecainide  (TAMBOCOR ) 50 MG tablet TAKE 1 TABLET(50 MG) BY MOUTH TWICE DAILY 03/30/23   Patwardhan, Manish J, MD  gabapentin  (NEURONTIN ) 600 MG tablet Take 1,200-1,800 mg by mouth See admin instructions. Take 1200 mg by mouth in the morning and 1800 mg in the evening 01/27/16   [provider]  gabapentin  (NEURONTIN ) 600 MG tablet     [provider]  hydrOXYzine  (ATARAX /VISTARIL ) 25 MG tablet Take 25 mg by mouth daily. 04/23/19   [provider]  metoprolol  succinate (TOPROL -XL) 50 MG 24 hr tablet Take 1 tablet (50 mg total) by mouth daily. Take with or immediately following a meal. 03/02/23   Patwardhan, Manish J, MD  omeprazole  (PRILOSEC) 40 MG capsule Take 1 capsule by mouth daily. 12/26/19   [provider]  rosuvastatin  (CRESTOR ) 10 MG tablet TAKE 1 TABLET(10 MG) BY MOUTH AT BEDTIME 05/06/23   Patwardhan, Kaye Parsons, MD  spironolactone  (ALDACTONE ) 25 MG tablet Daily 03/02/23   Patwardhan, Manish J, MD  trazodone  (DESYREL ) 300 MG tablet Take 300 mg by mouth at bedtime.  02/09/16   [provider]  venlafaxine  XR (EFFEXOR -XR) 150 MG 24 hr capsule Take 1 capsule  (150 mg total) by mouth daily. Patient taking differently: Take 300 mg by mouth daily. 09/23/15   Ferrel Hsu, NP    Physical Exam    Vital Signs:  Tammy Sandoval does not have vital signs available for review today.  Given telephonic nature of communication, physical exam is limited. AAOx3. NAD. Normal affect.  Speech and respirations are unlabored.  Accessory Clinical Findings    None  Assessment & Plan    1.  Preoperative Cardiovascular Risk Assessment: According to the Revised Cardiac Risk Index (RCRI), her Perioperative Risk of Major Cardiac Event is (%): 0.9. Her Functional Capacity in METs is: 5.62 according to the Duke Activity Status Index (DASI). Therefore, based on ACC/AHA guidelines, patient would be at acceptable risk for the planned procedure without further  cardiovascular testing.  The patient was advised that if she develops new symptoms prior to surgery to contact our office to arrange for a follow-up visit, and she verbalized understanding.  A copy of this note will be routed to requesting surgeon.  Time:   Today, I have spent 9 minutes with the patient with telehealth technology discussing medical history, symptoms, and management plan.     Ava Boatman, NP  09/27/2023, 9:19 AM

## 2023-10-05 ENCOUNTER — Other Ambulatory Visit: Payer: Self-pay | Admitting: Cardiology

## 2023-10-05 DIAGNOSIS — R0609 Other forms of dyspnea: Secondary | ICD-10-CM

## 2023-10-05 DIAGNOSIS — I5020 Unspecified systolic (congestive) heart failure: Secondary | ICD-10-CM

## 2023-10-05 DIAGNOSIS — I493 Ventricular premature depolarization: Secondary | ICD-10-CM

## 2023-11-07 ENCOUNTER — Other Ambulatory Visit: Payer: Self-pay | Admitting: Cardiology

## 2023-11-07 DIAGNOSIS — I493 Ventricular premature depolarization: Secondary | ICD-10-CM

## 2023-11-07 DIAGNOSIS — I5020 Unspecified systolic (congestive) heart failure: Secondary | ICD-10-CM

## 2023-11-07 DIAGNOSIS — R0609 Other forms of dyspnea: Secondary | ICD-10-CM

## 2023-11-15 ENCOUNTER — Other Ambulatory Visit: Payer: Self-pay | Admitting: Cardiology

## 2023-11-15 DIAGNOSIS — R0609 Other forms of dyspnea: Secondary | ICD-10-CM

## 2023-11-15 DIAGNOSIS — I5020 Unspecified systolic (congestive) heart failure: Secondary | ICD-10-CM

## 2023-11-15 DIAGNOSIS — I493 Ventricular premature depolarization: Secondary | ICD-10-CM

## 2023-11-23 ENCOUNTER — Other Ambulatory Visit: Payer: Self-pay | Admitting: Nurse Practitioner

## 2023-11-23 DIAGNOSIS — E2839 Other primary ovarian failure: Secondary | ICD-10-CM

## 2023-11-23 DIAGNOSIS — Z780 Asymptomatic menopausal state: Secondary | ICD-10-CM

## 2024-01-25 ENCOUNTER — Ambulatory Visit: Admitting: Podiatry

## 2024-01-25 ENCOUNTER — Encounter: Payer: Self-pay | Admitting: Podiatry

## 2024-01-25 DIAGNOSIS — R600 Localized edema: Secondary | ICD-10-CM

## 2024-01-25 DIAGNOSIS — L97321 Non-pressure chronic ulcer of left ankle limited to breakdown of skin: Secondary | ICD-10-CM

## 2024-01-25 NOTE — Progress Notes (Signed)
 Subjective:   Patient ID: Tammy Sandoval, female   DOB: 68 y.o.   MRN: [id number]   HPI Patient presents with ulceration of the inside of the left ankle and right lower leg and she is seeing a dermatologist for that in the next several weeks.  States it does seem to heal left and has also had history on the right and does have veins that are quite prominent and she is concerned.  Patient does not smoke tries to be active   Review of Systems  All other systems reviewed and are negative.       Objective:  Physical Exam Vitals and nursing note reviewed.  Constitutional:      Appearance: She is well-developed.  Pulmonary:     Effort: Pulmonary effort is normal.  Musculoskeletal:        General: Normal range of motion.  Skin:    General: Skin is warm.  Neurological:     Mental Status: She is alert.     Neurovascular status currently intact +1 pitting edema right and left foot and into the ankle consistent with venous disease with small ulceration left ankle on the medial malleolus measuring about 5 x 5 mm that is healed over for the most part no active drainage     Assessment:  Patient with venous disease venous ulceration left and lesion right that will be evaluated by dermatologist in a couple weeks      Plan:  Reviewed conditions and I have recommended mapping of the saphenous vein and I sent for evaluation and put a referral into vein doctors for this to be done with possibility of something to improve her condition and prevent future venous ulcerations.  Educated her on condition

## 2024-02-15 ENCOUNTER — Other Ambulatory Visit: Payer: Self-pay | Admitting: Cardiology

## 2024-02-15 DIAGNOSIS — E782 Mixed hyperlipidemia: Secondary | ICD-10-CM

## 2024-02-15 DIAGNOSIS — I5020 Unspecified systolic (congestive) heart failure: Secondary | ICD-10-CM

## 2024-02-15 DIAGNOSIS — I493 Ventricular premature depolarization: Secondary | ICD-10-CM

## 2024-02-15 DIAGNOSIS — R0609 Other forms of dyspnea: Secondary | ICD-10-CM

## 2024-02-16 MED ORDER — ROSUVASTATIN CALCIUM 10 MG PO TABS: 10.0000 mg | ORAL_TABLET | Freq: Every day | ORAL | 0 refills | Status: AC

## 2024-02-17 ENCOUNTER — Ambulatory Visit (HOSPITAL_COMMUNITY)

## 2024-02-20 ENCOUNTER — Ambulatory Visit (HOSPITAL_COMMUNITY)
Admission: RE | Admit: 2024-02-20 | Discharge: 2024-02-20 | Disposition: A | Discharge status: Home / Self Care | Source: Ambulatory Visit | Attending: Podiatry | Admitting: Podiatry

## 2024-02-20 DIAGNOSIS — R600 Localized edema: Secondary | ICD-10-CM | POA: Diagnosis present

## 2024-02-20 DIAGNOSIS — L97321 Non-pressure chronic ulcer of left ankle limited to breakdown of skin: Secondary | ICD-10-CM | POA: Diagnosis present

## 2024-05-09 ENCOUNTER — Other Ambulatory Visit: Payer: Self-pay | Admitting: Cardiology

## 2024-05-09 DIAGNOSIS — R0609 Other forms of dyspnea: Secondary | ICD-10-CM

## 2024-05-09 DIAGNOSIS — I5020 Unspecified systolic (congestive) heart failure: Secondary | ICD-10-CM

## 2024-05-09 DIAGNOSIS — I493 Ventricular premature depolarization: Secondary | ICD-10-CM

## 2024-05-16 MED ORDER — FLECAINIDE ACETATE 50 MG PO TABS: 50.0000 mg | ORAL_TABLET | Freq: Two times a day (BID) | ORAL | 0 refills | Status: AC

## 2024-05-16 NOTE — Telephone Encounter (Signed)
 In accordance with refill protocols, please review and address the following requirements before this medication refill can be authorized:  Labs
